# Patient Record
Sex: Male | Born: 1955 | Race: White | Hispanic: No | Marital: Married | Smoking: Never smoker
Health system: Southern US, Community
[De-identification: ages and names within clinical notes are randomized; demographics above are authoritative.]

## PROBLEM LIST (undated history)

## (undated) DIAGNOSIS — K259 Gastric ulcer, unspecified as acute or chronic, without hemorrhage or perforation: Secondary | ICD-10-CM

## (undated) DIAGNOSIS — J189 Pneumonia, unspecified organism: Secondary | ICD-10-CM

## (undated) DIAGNOSIS — M1990 Unspecified osteoarthritis, unspecified site: Secondary | ICD-10-CM

## (undated) DIAGNOSIS — C679 Malignant neoplasm of bladder, unspecified: Secondary | ICD-10-CM

## (undated) DIAGNOSIS — Z87442 Personal history of urinary calculi: Secondary | ICD-10-CM

## (undated) DIAGNOSIS — I499 Cardiac arrhythmia, unspecified: Secondary | ICD-10-CM

## (undated) HISTORY — DX: Malignant neoplasm of bladder, unspecified: C67.9

## (undated) HISTORY — PX: HERNIA REPAIR: SHX51

## (undated) HISTORY — PX: GASTRECTOMY: SHX58

## (undated) HISTORY — PX: KIDNEY STONE SURGERY: SHX686

## (undated) HISTORY — PX: APPENDECTOMY: SHX54

## (undated) HISTORY — PX: PERCUTANEOUS NEPHROLITHOTRIPSY: SHX2206

## (undated) HISTORY — PX: CHOLECYSTECTOMY: SHX55

---

## 2003-03-19 ENCOUNTER — Ambulatory Visit (HOSPITAL_COMMUNITY): Admission: RE | Admit: 2003-03-19 | Discharge: 2003-03-19 | Payer: Self-pay | Admitting: Family Medicine

## 2003-03-19 ENCOUNTER — Encounter: Payer: Self-pay | Admitting: Family Medicine

## 2009-02-16 ENCOUNTER — Emergency Department (HOSPITAL_COMMUNITY): Admission: EM | Admit: 2009-02-16 | Discharge: 2009-02-16 | Payer: Self-pay | Admitting: Emergency Medicine

## 2011-07-29 DIAGNOSIS — Z87442 Personal history of urinary calculi: Secondary | ICD-10-CM | POA: Insufficient documentation

## 2011-07-29 DIAGNOSIS — E291 Testicular hypofunction: Secondary | ICD-10-CM | POA: Insufficient documentation

## 2011-07-29 DIAGNOSIS — R6882 Decreased libido: Secondary | ICD-10-CM | POA: Insufficient documentation

## 2011-10-28 DIAGNOSIS — E538 Deficiency of other specified B group vitamins: Secondary | ICD-10-CM | POA: Insufficient documentation

## 2011-12-05 DIAGNOSIS — G4733 Obstructive sleep apnea (adult) (pediatric): Secondary | ICD-10-CM | POA: Insufficient documentation

## 2012-03-21 DIAGNOSIS — C679 Malignant neoplasm of bladder, unspecified: Secondary | ICD-10-CM | POA: Insufficient documentation

## 2012-07-24 ENCOUNTER — Ambulatory Visit: Payer: Self-pay | Admitting: Emergency Medicine

## 2012-07-24 LAB — CBC WITH DIFFERENTIAL/PLATELET
Eosinophil %: 4.6 %
HCT: 46.6 % (ref 40.0–52.0)
Lymphocyte #: 2.6 10*3/uL (ref 1.0–3.6)
MCHC: 32.8 g/dL (ref 32.0–36.0)
MCV: 87 fL (ref 80–100)
Monocyte %: 8.5 %
Neutrophil #: 6.2 10*3/uL (ref 1.4–6.5)
RDW: 15.6 % — ABNORMAL HIGH (ref 11.5–14.5)

## 2012-07-31 ENCOUNTER — Ambulatory Visit: Payer: Self-pay | Admitting: Emergency Medicine

## 2014-05-31 DIAGNOSIS — N200 Calculus of kidney: Secondary | ICD-10-CM | POA: Insufficient documentation

## 2014-12-30 NOTE — Op Note (Signed)
PATIENT NAME:  Alex Thomas, Alex Thomas MR#:  [id number] DATE OF BIRTH:  March 13, 1956  DATE OF PROCEDURE:  07/31/2012  PREOPERATIVE DIAGNOSIS: Large ventral hernia.   POSTOPERATIVE DIAGNOSIS: Large ventral hernia.   PROCEDURE: Repair of ventral hernia with mesh.  INDICATION: This is a patient who had multiple surgeries in the abdomen, has a long midline scar, and in the upper to middle part of the scar the patient has a large lump which is increasingly getting tender. The patient was then brought in for surgery.  DESCRIPTION OF PROCEDURE: Under general anesthesia, the abdomen was then prepped and draped. A small incision was made in the midline. After cutting skin and subcutaneous tissue, the hernia was noticed. The opening was small so dissected all around. I had to release a little bit to get the contents reduced back into the abdomen. After this was done, I freed out the edges of the omentum away from the hernia opening and then I put a mesh there and sutured to the abdominal wall with interrupted 0 Surgilon sutures. It went very well. After that subcuticular closure was performed with 3-0 Vicryl and 4-0 Vicryl, also staples, and pressure dressing was applied. The patient tolerated the procedure well and was sent to the Recovery Room in satisfactory condition.  ____________________________ Welford Roche Phylis Bougie, MD msh:slb D: 07/31/2012 10:33:22 ET T: 07/31/2012 11:02:43 ET JOB#: [id number]  cc: Meili Kleckley S. Phylis Bougie, MD, <Dictator> Meindert A. Brunetta Genera, MD Sharene Butters MD ELECTRONICALLY SIGNED 08/07/2012 11:06

## 2016-05-23 DIAGNOSIS — E291 Testicular hypofunction: Secondary | ICD-10-CM | POA: Diagnosis not present

## 2016-05-23 DIAGNOSIS — C679 Malignant neoplasm of bladder, unspecified: Secondary | ICD-10-CM | POA: Diagnosis not present

## 2016-05-23 DIAGNOSIS — D519 Vitamin B12 deficiency anemia, unspecified: Secondary | ICD-10-CM | POA: Diagnosis not present

## 2016-06-16 DIAGNOSIS — D519 Vitamin B12 deficiency anemia, unspecified: Secondary | ICD-10-CM | POA: Diagnosis not present

## 2016-06-16 DIAGNOSIS — E291 Testicular hypofunction: Secondary | ICD-10-CM | POA: Diagnosis not present

## 2016-06-16 DIAGNOSIS — E782 Mixed hyperlipidemia: Secondary | ICD-10-CM | POA: Diagnosis not present

## 2016-06-18 DIAGNOSIS — Z6841 Body Mass Index (BMI) 40.0 and over, adult: Secondary | ICD-10-CM | POA: Diagnosis not present

## 2016-07-23 DIAGNOSIS — E291 Testicular hypofunction: Secondary | ICD-10-CM | POA: Diagnosis not present

## 2016-07-23 DIAGNOSIS — E785 Hyperlipidemia, unspecified: Secondary | ICD-10-CM | POA: Diagnosis not present

## 2016-07-23 DIAGNOSIS — R7301 Impaired fasting glucose: Secondary | ICD-10-CM | POA: Diagnosis not present

## 2016-07-23 DIAGNOSIS — Z23 Encounter for immunization: Secondary | ICD-10-CM | POA: Diagnosis not present

## 2016-07-23 DIAGNOSIS — E870 Hyperosmolality and hypernatremia: Secondary | ICD-10-CM | POA: Diagnosis not present

## 2016-07-23 DIAGNOSIS — Z0001 Encounter for general adult medical examination with abnormal findings: Secondary | ICD-10-CM | POA: Diagnosis not present

## 2017-05-12 ENCOUNTER — Ambulatory Visit (HOSPITAL_COMMUNITY)
Admission: RE | Admit: 2017-05-12 | Discharge: 2017-05-12 | Disposition: A | Discharge status: Home / Self Care | Payer: BLUE CROSS/BLUE SHIELD | Source: Ambulatory Visit | Attending: Internal Medicine | Admitting: Internal Medicine

## 2017-05-12 ENCOUNTER — Other Ambulatory Visit (HOSPITAL_COMMUNITY): Payer: Self-pay | Admitting: Internal Medicine

## 2017-05-12 DIAGNOSIS — N433 Hydrocele, unspecified: Secondary | ICD-10-CM | POA: Diagnosis not present

## 2017-05-12 DIAGNOSIS — N50811 Right testicular pain: Secondary | ICD-10-CM | POA: Diagnosis not present

## 2017-05-12 DIAGNOSIS — N4400 Torsion of testis, unspecified: Secondary | ICD-10-CM

## 2017-05-12 DIAGNOSIS — R52 Pain, unspecified: Secondary | ICD-10-CM | POA: Diagnosis not present

## 2017-05-12 DIAGNOSIS — N451 Epididymitis: Secondary | ICD-10-CM | POA: Diagnosis not present

## 2017-05-12 DIAGNOSIS — N5089 Other specified disorders of the male genital organs: Secondary | ICD-10-CM | POA: Diagnosis not present

## 2019-01-15 DIAGNOSIS — N390 Urinary tract infection, site not specified: Secondary | ICD-10-CM | POA: Diagnosis not present

## 2019-01-15 DIAGNOSIS — M545 Low back pain: Secondary | ICD-10-CM | POA: Diagnosis not present

## 2019-03-13 DIAGNOSIS — R7301 Impaired fasting glucose: Secondary | ICD-10-CM | POA: Diagnosis not present

## 2019-03-13 DIAGNOSIS — Z1329 Encounter for screening for other suspected endocrine disorder: Secondary | ICD-10-CM | POA: Diagnosis not present

## 2019-03-13 DIAGNOSIS — Z0000 Encounter for general adult medical examination without abnormal findings: Secondary | ICD-10-CM | POA: Diagnosis not present

## 2021-07-15 ENCOUNTER — Encounter (INDEPENDENT_AMBULATORY_CARE_PROVIDER_SITE_OTHER): Payer: Self-pay | Admitting: *Deleted

## 2021-07-21 DIAGNOSIS — E785 Hyperlipidemia, unspecified: Secondary | ICD-10-CM | POA: Insufficient documentation

## 2021-10-22 ENCOUNTER — Telehealth (INDEPENDENT_AMBULATORY_CARE_PROVIDER_SITE_OTHER): Payer: Self-pay | Admitting: *Deleted

## 2021-10-22 NOTE — Telephone Encounter (Signed)
Called pt, spouse answer. Pt not available currently. LMTCB with spouse to schedule colonoscopy with Dr. Jenetta Downer, propofol asa 3.

## 2021-11-03 ENCOUNTER — Other Ambulatory Visit (INDEPENDENT_AMBULATORY_CARE_PROVIDER_SITE_OTHER): Payer: Self-pay

## 2021-11-03 ENCOUNTER — Telehealth (INDEPENDENT_AMBULATORY_CARE_PROVIDER_SITE_OTHER): Payer: Self-pay

## 2021-11-03 ENCOUNTER — Encounter (INDEPENDENT_AMBULATORY_CARE_PROVIDER_SITE_OTHER): Payer: Self-pay

## 2021-11-03 DIAGNOSIS — Z800 Family history of malignant neoplasm of digestive organs: Secondary | ICD-10-CM

## 2021-11-03 DIAGNOSIS — Z1211 Encounter for screening for malignant neoplasm of colon: Secondary | ICD-10-CM

## 2021-11-03 MED ORDER — PEG 3350-KCL-NA BICARB-NACL 420 G PO SOLR: 4000.0000 mL | ORAL | 0 refills | Status: DC

## 2021-11-03 NOTE — Telephone Encounter (Signed)
Alex Thomas CMA

## 2021-11-03 NOTE — Telephone Encounter (Signed)
Ok to schedule.  Thanks,  Ashleyann Shoun [name], MD Gastroenterology and Hepatology [facility]  

## 2021-11-03 NOTE — Telephone Encounter (Signed)
Referring MD/PCP: [place] Crane  Procedure: Tcs  Reason/Indication:  Screening, Fam Hx of Colon Ca  Has patient had this procedure before?  no  If so, when, by whom and where?    Is there a family history of colon cancer?  yes  Who?  What age when diagnosed?  mother  Is patient diabetic? If yes, Type 1 or Type 2   no      Does patient have prosthetic heart valve or mechanical valve?  no  Do you have a pacemaker/defibrillator?  no  Has patient ever had endocarditis/atrial fibrillation? no  Does patient use oxygen? no  Has patient had joint replacement within last 12 months?  no  Is patient constipated or do they take laxatives? no  Does patient have a history of alcohol/drug use?  no  Have you had a stroke/heart attack last 6 mths? no  Do you take medicine for weight loss?  no  For male patients,: have you had a hysterectomy N/A                      are you post menopausal N/A                      do you still have your menstrual cycle N/A  Is patient on blood thinner such as Coumadin, Plavix and/or Aspirin? no  Medications: none  Allergies: iodinated contrast media  Medication Adjustment per Dr Jenetta Downer none  Procedure date & time: 11/23/21 at 8:05 am

## 2021-11-04 ENCOUNTER — Encounter (INDEPENDENT_AMBULATORY_CARE_PROVIDER_SITE_OTHER): Payer: Self-pay

## 2021-11-16 NOTE — Patient Instructions (Signed)
? ? ? ? ? ? ? Alex Thomas ? 11/16/2021  ?  ? '@PREFPERIOPPHARMACY'$ @ ? ? Your procedure is scheduled on  11/23/2021. ? ? Report to [facility] at  [phone]  A.M. ? ? Call this number if you have problems the morning of surgery: ? [phone] ? ? Remember: ? Follow the diet and prep instructions given to you by the office. ?  ? Take these medicines the morning of surgery with A SIP OF WATER  ? ?                                        None ?  ? ? Do not wear jewelry, make-up or nail polish. ? Do not wear lotions, powders, or perfumes, or deodorant. ? Do not shave 48 hours prior to surgery.  Men may shave face and neck. ? Do not bring valuables to the hospital. ? [name] is not responsible for any belongings or valuables. ? ?Contacts, dentures or bridgework may not be worn into surgery.  Leave your suitcase in the car.  After surgery it may be brought to your room. ? ?For patients admitted to the hospital, discharge time will be determined by your treatment team. ? ?Patients discharged the day of surgery will not be allowed to drive home and must have someone with them for 24 hours.  ? ? ?Special instructions:   DO NOT smoke tobacco or vape for 24 hours before your procedure. ? ?Please read over the following fact sheets that you were given. ?Anesthesia Post-op Instructions and Care and Recovery After Surgery ?  ? ? ? Colonoscopy, Adult, Care After ?This sheet gives you information about how to care for yourself after your procedure. Your health care provider may also give you more specific instructions. If you have problems or questions, contact your health care provider. ?What can I expect after the procedure? ?After the procedure, it is common to have: ?A small amount of blood in your stool for 24 hours after the procedure. ?Some gas. ?Mild cramping or bloating of your abdomen. ?Follow these instructions at home: ?Eating and drinking ? ?Drink enough fluid to keep your urine pale yellow. ?Follow instructions from your  health care provider about eating or drinking restrictions. ?Resume your normal diet as instructed by your health care provider. Avoid heavy or fried foods that are hard to digest. ?Activity ?Rest as told by your health care provider. ?Avoid sitting for a long time without moving. Get up to take short walks every 1-2 hours. This is important to improve blood flow and breathing. Ask for help if you feel weak or unsteady. ?Return to your normal activities as told by your health care provider. Ask your health care provider what activities are safe for you. ?Managing cramping and bloating ? ?Try walking around when you have cramps or feel bloated. ?Apply heat to your abdomen as told by your health care provider. Use the heat source that your health care provider recommends, such as a moist heat pack or a heating pad. ?Place a towel between your skin and the heat source. ?Leave the heat on for 20-30 minutes. ?Remove the heat if your skin turns bright red. This is especially important if you are unable to feel pain, heat, or cold. You may have a greater risk of getting burned. ?General instructions ?If you were given a sedative during the procedure, it  can affect you for several hours. Do not drive or operate machinery until your health care provider says that it is safe. ?For the first 24 hours after the procedure: ?Do not sign important documents. ?Do not drink alcohol. ?Do your regular daily activities at a slower pace than normal. ?Eat soft foods that are easy to digest. ?Take over-the-counter and prescription medicines only as told by your health care provider. ?Keep all follow-up visits as told by your health care provider. This is important. ?Contact a health care provider if: ?You have blood in your stool 2-3 days after the procedure. ?Get help right away if you have: ?More than a small spotting of blood in your stool. ?Large blood clots in your stool. ?Swelling of your abdomen. ?Nausea or vomiting. ?A  fever. ?Increasing pain in your abdomen that is not relieved with medicine. ?Summary ?After the procedure, it is common to have a small amount of blood in your stool. You may also have mild cramping and bloating of your abdomen. ?If you were given a sedative during the procedure, it can affect you for several hours. Do not drive or operate machinery until your health care provider says that it is safe. ?Get help right away if you have a lot of blood in your stool, nausea or vomiting, a fever, or increased pain in your abdomen. ?This information is not intended to replace advice given to you by your health care provider. Make sure you discuss any questions you have with your health care provider. ?Document Revised: 07/05/2019 Document Reviewed: 03/25/2019 ?Elsevier Patient Education ? [place]. ?Monitored Anesthesia Care, Care After ?This sheet gives you information about how to care for yourself after your procedure. Your health care provider may also give you more specific instructions. If you have problems or questions, contact your health care provider. ?What can I expect after the procedure? ?After the procedure, it is common to have: ?Tiredness. ?Forgetfulness about what happened after the procedure. ?Impaired judgment for important decisions. ?Nausea or vomiting. ?Some difficulty with balance. ?Follow these instructions at home: ?For the time period you were told by your health care provider: ?  ?Rest as needed. ?Do not participate in activities where you could fall or become injured. ?Do not drive or use machinery. ?Do not drink alcohol. ?Do not take sleeping pills or medicines that cause drowsiness. ?Do not make important decisions or sign legal documents. ?Do not take care of children on your own. ?Eating and drinking ?Follow the diet that is recommended by your health care provider. ?Drink enough fluid to keep your urine pale yellow. ?If you vomit: ?Drink water, juice, or soup when you can drink  without vomiting. ?Make sure you have little or no nausea before eating solid foods. ?General instructions ?Have a responsible adult stay with you for the time you are told. It is important to have someone help care for you until you are awake and alert. ?Take over-the-counter and prescription medicines only as told by your health care provider. ?If you have sleep apnea, surgery and certain medicines can increase your risk for breathing problems. Follow instructions from your health care provider about wearing your sleep device: ?Anytime you are sleeping, including during daytime naps. ?While taking prescription pain medicines, sleeping medicines, or medicines that make you drowsy. ?Avoid smoking. ?Keep all follow-up visits as told by your health care provider. This is important. ?Contact a health care provider if: ?You keep feeling nauseous or you keep vomiting. ?You feel light-headed. ?You are still  sleepy or having trouble with balance after 24 hours. ?You develop a rash. ?You have a fever. ?You have redness or swelling around the IV site. ?Get help right away if: ?You have trouble breathing. ?You have new-onset confusion at home. ?Summary ?For several hours after your procedure, you may feel tired. You may also be forgetful and have poor judgment. ?Have a responsible adult stay with you for the time you are told. It is important to have someone help care for you until you are awake and alert. ?Rest as told. Do not drive or operate machinery. Do not drink alcohol or take sleeping pills. ?Get help right away if you have trouble breathing, or if you suddenly become confused. ?This information is not intended to replace advice given to you by your health care provider. Make sure you discuss any questions you have with your health care provider. ?Document Revised: 05/14/2020 Document Reviewed: 08/01/2019 ?Elsevier Patient Education ? [place]. ? ?

## 2021-11-18 ENCOUNTER — Other Ambulatory Visit (INDEPENDENT_AMBULATORY_CARE_PROVIDER_SITE_OTHER): Payer: Self-pay

## 2021-11-19 ENCOUNTER — Encounter (HOSPITAL_COMMUNITY): Payer: Self-pay

## 2021-11-19 ENCOUNTER — Encounter (HOSPITAL_COMMUNITY)
Admission: RE | Admit: 2021-11-19 | Discharge: 2021-11-19 | Disposition: A | Discharge status: Home / Self Care | Payer: Medicare Other | Source: Ambulatory Visit | Attending: Gastroenterology | Admitting: Gastroenterology

## 2021-11-19 HISTORY — DX: Gastric ulcer, unspecified as acute or chronic, without hemorrhage or perforation: K25.9

## 2021-11-19 HISTORY — DX: Personal history of urinary calculi: Z87.442

## 2021-11-23 ENCOUNTER — Encounter (HOSPITAL_COMMUNITY): Payer: Self-pay | Admitting: Gastroenterology

## 2021-11-23 ENCOUNTER — Ambulatory Visit (HOSPITAL_COMMUNITY): Payer: Medicare Other | Admitting: Certified Registered"

## 2021-11-23 ENCOUNTER — Ambulatory Visit (HOSPITAL_COMMUNITY)
Admission: RE | Admit: 2021-11-23 | Discharge: 2021-11-23 | Disposition: A | Discharge status: Home / Self Care | Payer: Medicare Other | Attending: Gastroenterology | Admitting: Gastroenterology

## 2021-11-23 ENCOUNTER — Ambulatory Visit (HOSPITAL_BASED_OUTPATIENT_CLINIC_OR_DEPARTMENT_OTHER): Payer: Medicare Other | Admitting: Certified Registered"

## 2021-11-23 ENCOUNTER — Encounter (HOSPITAL_COMMUNITY)
Admission: RE | Disposition: A | Discharge status: Home / Self Care | Payer: Self-pay | Source: Home / Self Care | Attending: Gastroenterology

## 2021-11-23 ENCOUNTER — Encounter (INDEPENDENT_AMBULATORY_CARE_PROVIDER_SITE_OTHER): Payer: Self-pay | Admitting: *Deleted

## 2021-11-23 DIAGNOSIS — Z1211 Encounter for screening for malignant neoplasm of colon: Secondary | ICD-10-CM | POA: Diagnosis not present

## 2021-11-23 DIAGNOSIS — K635 Polyp of colon: Secondary | ICD-10-CM

## 2021-11-23 DIAGNOSIS — Z8711 Personal history of peptic ulcer disease: Secondary | ICD-10-CM | POA: Insufficient documentation

## 2021-11-23 DIAGNOSIS — K529 Noninfective gastroenteritis and colitis, unspecified: Secondary | ICD-10-CM

## 2021-11-23 DIAGNOSIS — Z6841 Body Mass Index (BMI) 40.0 and over, adult: Secondary | ICD-10-CM | POA: Insufficient documentation

## 2021-11-23 DIAGNOSIS — K5730 Diverticulosis of large intestine without perforation or abscess without bleeding: Secondary | ICD-10-CM | POA: Diagnosis not present

## 2021-11-23 DIAGNOSIS — K6289 Other specified diseases of anus and rectum: Secondary | ICD-10-CM | POA: Diagnosis not present

## 2021-11-23 DIAGNOSIS — K5140 Inflammatory polyps of colon without complications: Secondary | ICD-10-CM | POA: Insufficient documentation

## 2021-11-23 DIAGNOSIS — Z800 Family history of malignant neoplasm of digestive organs: Secondary | ICD-10-CM | POA: Diagnosis not present

## 2021-11-23 DIAGNOSIS — K648 Other hemorrhoids: Secondary | ICD-10-CM

## 2021-11-23 DIAGNOSIS — Z139 Encounter for screening, unspecified: Secondary | ICD-10-CM | POA: Diagnosis not present

## 2021-11-23 HISTORY — PX: POLYPECTOMY: SHX5525

## 2021-11-23 HISTORY — PX: BIOPSY: SHX5522

## 2021-11-23 HISTORY — PX: COLONOSCOPY WITH PROPOFOL: SHX5780

## 2021-11-23 LAB — COMPREHENSIVE METABOLIC PANEL
ALT: 28 U/L (ref 0–44)
AST: 27 U/L (ref 15–41)
Albumin: 4 g/dL (ref 3.5–5.0)
Alkaline Phosphatase: 370 U/L — ABNORMAL HIGH (ref 38–126)
Anion gap: 11 (ref 5–15)
BUN: 16 mg/dL (ref 8–23)
CO2: 24 mmol/L (ref 22–32)
Calcium: 8.9 mg/dL (ref 8.9–10.3)
Chloride: 102 mmol/L (ref 98–111)
Creatinine, Ser: 1.34 mg/dL — ABNORMAL HIGH (ref 0.61–1.24)
GFR, Estimated: 59 mL/min — ABNORMAL LOW (ref >60)
Glucose, Bld: 92 mg/dL (ref 70–99)
Potassium: 4.1 mmol/L (ref 3.5–5.1)
Sodium: 137 mmol/L (ref 135–145)
Total Bilirubin: 0.8 mg/dL (ref 0.3–1.2)
Total Protein: 8.2 g/dL — ABNORMAL HIGH (ref 6.5–8.1)

## 2021-11-23 LAB — CBC
HCT: 49.1 % (ref 39.0–52.0)
Hemoglobin: 15.4 g/dL (ref 13.0–17.0)
MCH: 29.5 pg (ref 26.0–34.0)
MCHC: 31.4 g/dL (ref 30.0–36.0)
MCV: 94.1 fL (ref 80.0–100.0)
Platelets: 342 10*3/uL (ref 150–400)
RBC: 5.22 MIL/uL (ref 4.22–5.81)
RDW: 14.4 % (ref 11.5–15.5)
WBC: 9.7 10*3/uL (ref 4.0–10.5)
nRBC: 0 % (ref 0.0–0.2)

## 2021-11-23 LAB — HEPATITIS B SURFACE ANTIGEN: Hepatitis B Surface Ag: NONREACTIVE

## 2021-11-23 LAB — HM COLONOSCOPY

## 2021-11-23 LAB — C-REACTIVE PROTEIN: CRP: 2.4 mg/dL — ABNORMAL HIGH (ref <1.0)

## 2021-11-23 SURGERY — COLONOSCOPY WITH PROPOFOL
Anesthesia: General

## 2021-11-23 MED ORDER — PHENYLEPHRINE HCL (PRESSORS) 10 MG/ML IV SOLN
INTRAVENOUS | Status: DC | PRN
Start: 2021-11-23 — End: 2021-11-23
  Administered 2021-11-23 (×2): 100 ug via INTRAVENOUS
  Administered 2021-11-23: 120 ug via INTRAVENOUS
  Administered 2021-11-23 (×2): 100 ug via INTRAVENOUS
  Administered 2021-11-23: 120 ug via INTRAVENOUS

## 2021-11-23 MED ORDER — LACTATED RINGERS IV SOLN: INTRAVENOUS | Status: DC

## 2021-11-23 MED ORDER — PROPOFOL 500 MG/50ML IV EMUL
INTRAVENOUS | Status: DC | PRN
  Administered 2021-11-23: 100 ug/kg/min via INTRAVENOUS

## 2021-11-23 MED ORDER — PROPOFOL 10 MG/ML IV BOLUS
INTRAVENOUS | Status: DC | PRN
  Administered 2021-11-23: 100 mg via INTRAVENOUS

## 2021-11-23 MED ORDER — PHENYLEPHRINE 40 MCG/ML (10ML) SYRINGE FOR IV PUSH (FOR BLOOD PRESSURE SUPPORT)
PREFILLED_SYRINGE | INTRAVENOUS | Status: AC
  Filled 2021-11-23: qty 10

## 2021-11-23 NOTE — Discharge Instructions (Signed)
You are being discharged to home.  Resume your previous diet.  We are waiting for your pathology results.  Your physician has recommended a repeat colonoscopy for surveillance based on pathology results.  

## 2021-11-23 NOTE — Op Note (Signed)
[facility] ?Patient Name: Alex Thomas ?Procedure Date: 11/23/2021 7:47 AM ?MRN: [id number] ?Date of Birth: Jun 26, 1956 ?Attending MD: [name] ,  ?CSN: [id number] ?Age: 66 ?Admit Type: Outpatient ?Procedure:                Colonoscopy ?Indications:              Screening for colorectal malignant neoplasm ?Providers:                [name], Lambert Mody, Aram Candela ?Referring MD:              ?Medicines:                Monitored Anesthesia Care ?Complications:            No immediate complications. ?Estimated Blood Loss:     Estimated blood loss: none. ?Procedure:                Pre-Anesthesia Assessment: ?                          - Prior to the procedure, a History and Physical  ?                          was performed, and patient medications, allergies  ?                          and sensitivities were reviewed. The patient's  ?                          tolerance of previous anesthesia was reviewed. ?                          - The risks and benefits of the procedure and the  ?                          sedation options and risks were discussed with the  ?                          patient. All questions were answered and informed  ?                          consent was obtained. ?                          - ASA Grade Assessment: II - A patient with mild  ?                          systemic disease. ?                          After obtaining informed consent, the colonoscope  ?                          was passed under direct vision. Throughout the  ?                          procedure, the patient's blood pressure, pulse,  and  ?                          oxygen saturations were monitored continuously. The  ?                          PCF-HQ190L ([id number]) scope was introduced through  ?                          the anus and advanced to the the terminal ileum.  ?                          The colonoscopy was performed without difficulty.  ?                          The patient tolerated the  procedure well. The  ?                          quality of the bowel preparation was good. ?Scope In: 8:03:51 AM ?Scope Out: 8:30:57 AM ?Scope Withdrawal Time: 0 hours 13 minutes 35 seconds  ?Total Procedure Duration: 0 hours 27 minutes 6 seconds  ?Findings: ?     The perianal and digital rectal examinations were normal. ?     The terminal ileum appeared normal. Biopsies were taken with a cold  ?     forceps for histology. ?     A 4 mm polyp was found in the cecum. The polyp was semi-sessile. The  ?     polyp was removed with a cold snare. Resection and retrieval were  ?     complete. ?     Inflammation characterized by altered vascularity, congestion (edema),  ?     erythema and granularity was found in a continuous and circumferential  ?     pattern from the sigmoid colon to the cecum. The area 40 cm proximal to  ?     anus was spared. This was moderate in severity. Biopsies from cecum/AC,  ?     transverse, DC, sigmoid and rectum were taken with a cold forceps for  ?     histology. ?     A few large-mouthed diverticula were found in the sigmoid colon. ?     Non-bleeding internal hemorrhoids were found during retroflexion. The  ?     hemorrhoids were small. ?Impression:               - The examined portion of the ileum was normal.  ?                          Biopsied. ?                          - One 4 mm polyp in the cecum, removed with a cold  ?                          snare. Resected and retrieved. ?                          - Colitis. Inflammation was found from the sigmoid  ?  colon to the cecum. This was moderate in severity.  ?                          Biopsied. ?                          - Diverticulosis in the sigmoid colon. ?                          - Non-bleeding internal hemorrhoids. ?Moderate Sedation: ?     Per Anesthesia Care ?Recommendation:           - Discharge patient to home (ambulatory). ?                          - Resume previous diet. ?                          -  Await pathology results. ?                          - Repeat colonoscopy for surveillance based on  ?                          pathology results. ?Procedure Code(s):        --- Professional --- ?                          [phone], Colonoscopy, flexible; with removal of  ?                          tumor(s), polyp(s), or other lesion(s) by snare  ?                          technique ?                          45380, 59, Colonoscopy, flexible; with biopsy,  ?                          single or multiple ?Diagnosis Code(s):        --- Professional --- ?                          Z12.11, Encounter for screening for malignant  ?                          neoplasm of colon ?                          K64.8, Other hemorrhoids ?                          K63.5, Polyp of colon ?                          K52.9, Noninfective gastroenteritis and colitis,  ?  unspecified ?                          K57.30, Diverticulosis of large intestine without  ?                          perforation or abscess without bleeding ?CPT copyright 2019 American Medical Association. All rights reserved. ?The codes documented in this report are preliminary and upon coder review may  ?be revised to meet current compliance requirements. ?[name], MD ?[name],  ?11/23/2021 8:41:39 AM ?This report has been signed electronically. ?Number of Addenda: 0 ?

## 2021-11-23 NOTE — Anesthesia Preprocedure Evaluation (Signed)
Anesthesia Evaluation  ?Patient identified by MRN, date of birth, ID band ?Patient awake ? ? ? ?Reviewed: ?Allergy & Precautions, H&P , NPO status , Patient's Chart, lab work & pertinent test results, reviewed documented beta blocker date and time  ? ?Airway ?Mallampati: II ? ?TM Distance: >3 FB ?Neck ROM: full ? ? ? Dental ?no notable dental hx. ? ?  ?Pulmonary ?neg pulmonary ROS,  ?  ?Pulmonary exam normal ?breath sounds clear to auscultation ? ? ? ? ? ? Cardiovascular ?Exercise Tolerance: Good ?negative cardio ROS ? ? ?Rhythm:regular Rate:Normal ? ? ?  ?Neuro/Psych ?negative neurological ROS ? negative psych ROS  ? GI/Hepatic ?Neg liver ROS, PUD,   ?Endo/Other  ?Morbid obesity ? Renal/GU ?negative Renal ROS  ?negative genitourinary ?  ?Musculoskeletal ? ? Abdominal ?  ?Peds ? Hematology ?negative hematology ROS ?(+)   ?Anesthesia Other Findings ? ? Reproductive/Obstetrics ?negative OB ROS ? ?  ? ? ? ? ? ? ? ? ? ? ? ? ? ?  ?  ? ? ? ? ? ? ? ? ?Anesthesia Physical ?Anesthesia Plan ? ?ASA: 3 ? ?Anesthesia Plan: General  ? ?Post-op Pain Management:   ? ?Induction:  ? ?PONV Risk Score and Plan: Propofol infusion ? ?Airway Management Planned:  ? ?Additional Equipment:  ? ?Intra-op Plan:  ? ?Post-operative Plan:  ? ?Informed Consent: I have reviewed the patients History and Physical, chart, labs and discussed the procedure including the risks, benefits and alternatives for the proposed anesthesia with the patient or authorized representative who has indicated his/her understanding and acceptance.  ? ? ? ?Dental Advisory Given ? ?Plan Discussed with: CRNA ? ?Anesthesia Plan Comments:   ? ? ? ? ? ? ?Anesthesia Quick Evaluation ? ?

## 2021-11-23 NOTE — H&P (Signed)
Alex Thomas is an 66 y.o. male.   ?Chief Complaint: CRC screening ?HPI: 66 y/o M with PMH gastric ulcers, coming for screening colonoscopy. Last colonoscopy perfromed close to 15 years ago per patient, states that it was normal but no reports are available.  The patient denies having any complaints such as melena, hematochezia, abdominal pain or distention, change in her bowel movement consistency or frequency, no changes in her weight recently.  Mother diagnosed with colon cancer in her late 66s. ? ?Past Medical History:  ?Diagnosis Date  ? History of kidney stones   ? Multiple gastric ulcers   ? ? ?Past Surgical History:  ?Procedure Laterality Date  ? APPENDECTOMY    ? GASTRECTOMY    ? HERNIA REPAIR    ? KIDNEY STONE SURGERY    ? ? ?History reviewed. No pertinent family history. ?Social History:  reports that he has never smoked. He has never used smokeless tobacco. He reports that he does not currently use alcohol. He reports that he does not currently use drugs. ? ?Allergies: No Known Allergies ? ?Medications Prior to Admission  ?Medication Sig Dispense Refill  ? polyethylene glycol-electrolytes (TRILYTE) 420 g solution Take 4,000 mLs by mouth as directed. 4000 mL 0  ? ? ?No results found for this or any previous visit (from the past 48 hour(s)). ?No results found. ? ?Review of Systems  ?Constitutional: Negative.   ?HENT: Negative.    ?Eyes: Negative.   ?Respiratory: Negative.    ?Cardiovascular: Negative.   ?Gastrointestinal: Negative.   ?Endocrine: Negative.   ?Genitourinary: Negative.   ?Musculoskeletal: Negative.   ?Skin: Negative.   ?Allergic/Immunologic: Negative.   ?Neurological: Negative.   ?Hematological: Negative.   ?Psychiatric/Behavioral: Negative.    ? ?Blood pressure 99/63, pulse 98, temperature 97.8 ?F (36.6 ?C), temperature source Oral, resp. rate 18, height '6\' 3"'$  (1.905 m), weight (!) 145.4 kg, SpO2 96 %. ?Physical Exam  ?GENERAL: The patient is AO x3, in no acute distress. ?HEENT: Head is  normocephalic and atraumatic. EOMI are intact. Mouth is well hydrated and without lesions. ?NECK: Supple. No masses ?LUNGS: Clear to auscultation. No presence of rhonchi/wheezing/rales. Adequate chest expansion ?HEART: RRR, normal s1 and s2. ?ABDOMEN: Soft, nontender, no guarding, no peritoneal signs, and nondistended. BS +. No masses. ?EXTREMITIES: Without any cyanosis, clubbing, rash, lesions or edema. ?NEUROLOGIC: AOx3, no focal motor deficit. ?SKIN: no jaundice, no rashes ? ?Assessment/Plan ? 66 y/o M with PMH gastric ulcers, coming for screening colonoscopy. The patient is at average risk for colorectal cancer.  We will proceed with colonoscopy today. ? ? ?[name], MD ?11/23/2021, 7:37 AM ? ? ? ?

## 2021-11-23 NOTE — Transfer of Care (Signed)
Immediate Anesthesia Transfer of Care Note ? ?Patient: Alex Thomas ? ?Procedure(s) Performed: COLONOSCOPY WITH PROPOFOL ?POLYPECTOMY ?BIOPSY ? ?Patient Location: PACU ? ?Anesthesia Type:General ? ?Level of Consciousness: awake, alert , oriented and patient cooperative ? ?Airway & Oxygen Therapy: Patient Spontanous Breathing ? ?Post-op Assessment: Report given to RN, Post -op Vital signs reviewed and stable and Patient moving all extremities X 4 ? ?Post vital signs: Reviewed and stable ? ?Last Vitals:  ?Vitals Value Taken Time  ?BP    ?Temp 36.6 ?C 11/23/21 0833  ?Pulse 87 11/23/21 0833  ?Resp 19 11/23/21 0833  ?SpO2 96 % 11/23/21 0833  ? ? ?Last Pain:  ?Vitals:  ? 11/23/21 0833  ?TempSrc: Oral  ?PainSc: 0-No pain  ?   ? ?Patients Stated Pain Goal: 6 (11/23/21 2224) ? ?Complications: No notable events documented. ?

## 2021-11-23 NOTE — Anesthesia Postprocedure Evaluation (Signed)
Anesthesia Post Note ? ?Patient: Alex Thomas ? ?Procedure(s) Performed: COLONOSCOPY WITH PROPOFOL ?POLYPECTOMY ?BIOPSY ? ?Patient location during evaluation: Short Stay ?Anesthesia Type: General ?Level of consciousness: awake and alert ?Pain management: pain level controlled ?Vital Signs Assessment: post-procedure vital signs reviewed and stable ?Respiratory status: spontaneous breathing ?Cardiovascular status: blood pressure returned to baseline ?Postop Assessment: no apparent nausea or vomiting ?Anesthetic complications: no ? ? ?No notable events documented. ? ? ?Last Vitals:  ?Vitals:  ? 11/23/21 0724 11/23/21 0833  ?BP: 99/63   ?Pulse:  87  ?Resp:  19  ?Temp:  36.6 ?C  ?SpO2:  96%  ?  ?Last Pain:  ?Vitals:  ? 11/23/21 0833  ?TempSrc: Oral  ?PainSc: 0-No pain  ? ? ?  ?  ?  ?  ?  ?  ? ?Kordell Jafri ? ? ? ? ?

## 2021-11-24 LAB — SURGICAL PATHOLOGY

## 2021-11-25 LAB — QUANTIFERON-TB GOLD PLUS (RQFGPL)
QuantiFERON Mitogen Value: >10 IU/mL
QuantiFERON Nil Value: 0.01 IU/mL
QuantiFERON TB1 Ag Value: 0.03 IU/mL
QuantiFERON TB2 Ag Value: 0.02 IU/mL

## 2021-11-25 LAB — QUANTIFERON-TB GOLD PLUS: QuantiFERON-TB Gold Plus: NEGATIVE

## 2021-11-26 ENCOUNTER — Encounter (HOSPITAL_COMMUNITY): Payer: Self-pay | Admitting: Gastroenterology

## 2021-11-29 ENCOUNTER — Other Ambulatory Visit (INDEPENDENT_AMBULATORY_CARE_PROVIDER_SITE_OTHER): Payer: Self-pay

## 2021-11-29 ENCOUNTER — Encounter (INDEPENDENT_AMBULATORY_CARE_PROVIDER_SITE_OTHER): Payer: Self-pay

## 2021-11-29 DIAGNOSIS — R748 Abnormal levels of other serum enzymes: Secondary | ICD-10-CM

## 2021-12-15 ENCOUNTER — Other Ambulatory Visit (INDEPENDENT_AMBULATORY_CARE_PROVIDER_SITE_OTHER): Payer: Self-pay | Admitting: Gastroenterology

## 2021-12-15 ENCOUNTER — Ambulatory Visit (HOSPITAL_COMMUNITY)
Admission: RE | Admit: 2021-12-15 | Discharge: 2021-12-15 | Disposition: A | Discharge status: Home / Self Care | Payer: Medicare Other | Source: Ambulatory Visit | Attending: Gastroenterology | Admitting: Gastroenterology

## 2021-12-15 DIAGNOSIS — R748 Abnormal levels of other serum enzymes: Secondary | ICD-10-CM | POA: Diagnosis not present

## 2021-12-15 DIAGNOSIS — K7689 Other specified diseases of liver: Secondary | ICD-10-CM | POA: Diagnosis not present

## 2021-12-15 DIAGNOSIS — K7290 Hepatic failure, unspecified without coma: Secondary | ICD-10-CM | POA: Diagnosis not present

## 2021-12-15 DIAGNOSIS — K6389 Other specified diseases of intestine: Secondary | ICD-10-CM | POA: Diagnosis not present

## 2021-12-15 MED ORDER — GADOBUTROL 1 MMOL/ML IV SOLN
10.0000 mL | Freq: Once | INTRAVENOUS | Status: AC | PRN
  Administered 2021-12-15: 10 mL via INTRAVENOUS

## 2021-12-20 ENCOUNTER — Ambulatory Visit (INDEPENDENT_AMBULATORY_CARE_PROVIDER_SITE_OTHER): Payer: Medicare Other | Admitting: Gastroenterology

## 2021-12-20 ENCOUNTER — Encounter (INDEPENDENT_AMBULATORY_CARE_PROVIDER_SITE_OTHER): Payer: Self-pay | Admitting: Gastroenterology

## 2021-12-20 DIAGNOSIS — K529 Noninfective gastroenteritis and colitis, unspecified: Secondary | ICD-10-CM | POA: Insufficient documentation

## 2021-12-20 DIAGNOSIS — K8301 Primary sclerosing cholangitis: Secondary | ICD-10-CM | POA: Diagnosis not present

## 2021-12-20 DIAGNOSIS — K51019 Ulcerative (chronic) pancolitis with unspecified complications: Secondary | ICD-10-CM

## 2021-12-20 NOTE — Patient Instructions (Signed)
Start Entyvio treatment ?Schedule CT enterography ?Referral to [facility] hepatology for [facility] multidisciplinary evaluation ?Rtc 3 months ?

## 2021-12-20 NOTE — Progress Notes (Signed)
[name], M.D. ?Gastroenterology & Hepatology ?[facility] ?[address] ?[place], [place] [address] ? ?Primary Care Physician: ?Celene Squibb, MD ?[place] ?[place] [id number] ? ?I will communicate my assessment and recommendations to the referring MD via EMR. ? 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 ?History of Present Illness: ?Alex Thomas is a 66 y.o. 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 ? ?The patient was last seen on 11/03/2021 when he underwent a colonoscopy for screening purposes. At that time, the patient was found to have: ?The perianal and digital rectal examinations were normal. ?The terminal ileum appeared normal. Biopsies were taken with a cold forceps for histology. ?A 4 mm polyp was found in the cecum. The polyp was semi-sessile. The polyp was removed with a cold snare. Resection and retrieval were complete. Path - inflammatory polyp. ?Inflammation characterized by altered vascularity, congestion (edema), erythema and granularity was found in a continuous and circumferential pattern from the sigmoid colon to the cecum - this was concerning for IBD. The area 40 cm proximal to anus was spared. This was moderate in severity. Biopsies from cecum/AC, transverse, DC, sigmoid and rectum were taken with a cold forceps for histology. ?A few large-mouthed diverticula were found in the sigmoid colon. ?Non-bleeding internal hemorrhoids were found during retroflexion. The hemorrhoids were small. ? ?Path: ?A. COLON, CECAL, POLYPECTOMY:  ?-  Inflammatory polyp (1 of 1 fragments)  ?-  No high-grade dysplasia or  malignancy identified  ? ?B. SMALL BOWEL, BIOPSY:  ?-  Chronic active ileitis  ?-  No granulomas, dysplasia or malignancy identified  ? ?C. COLON, CECAL, ASCENDING, BIOPSY:  ?-  Chronic active colitis  ?-  No granulomata, dysplasia or malignancy identified  ? ?D. COLON, TRANSVERSE, BIOPSY:  ?-  Chronic active colitis  ?-  No granulomata, dysplasia or malignancy identified  ? ?E. COLON, DESCENDING, BIOPSY:  ?-  Chronic active colitis  ?-  No granulomata, dysplasia or malignancy identified  ? ?F. COLON, SIGMOID, BIOPSY:  ?-  Chronic colitis  ?-  No acute inflammation  ?-  No granulomata, dysplasia or malignancy identified  ? ?G. RECTUM, BIOPSY:  ?-  Chronic colitis, focally active  ?-  No granulomata, dysplasia or malignancy identified  ? ?Due to these findings, I set up follow-up appointment in our clinic.  I also ordered blood work-up which showed markedly elevated alkaline phosphatase up to 378 his CMP with normal total bilirubin of 0.8, AST of 27, ALT of 28, albumin of 4.0, creatinine was 1.34, CRP was elevated 2.4 and CBC was within normal limits.  Also had hepatitis B surface antigen and QuantiFERON checked which were within normal limits.  Given the elevation of his alkaline phosphatase, I ordered an MRCP which showed the following findings ? ?Marked atrophy within the left hepatic lobe with associated intrahepatic biliary dilatation. Diffuse wall thickening and enhancement of the extrahepatic biliary system. Findings could be secondary to chronic cholangitis or segmental sclerosing cholangitis. No focal mass lesion or delayed enhancement identified to strongly suggest cholangiocarcinoma. ?The visualized colon demonstrates wall thickening and enhancement suspicious for colitis. ?Postsurgical changes suggesting previous distal gastrectomy. ?Probable reactive lymph nodes in  the porta hepatis. ? ?Regarding his symptoms, patient states for the last year he has presented episodes of changes in his bowel movements,  as he was having bowel movements a little later after having a meal. He also has looser bowel movements, usually moves his bowels twice a day on average. Denies any other complaints. The patient denies having any nausea, vomiting, fever, chills, hematochezia, melena, hematemesis, abdominal distention, abdominal pain, diarrhea, jaundice, pruritus. In the last few months he has lost 40 lb as he changed some of his habits. ? ?Last Colonoscopy: as above ? ?Mother had colon cancer at 50 years. No history of IBD in family. ? ?Past Medical History: ?Past Medical History:  ?Diagnosis Date  ? History of kidney stones   ? Multiple gastric ulcers   ? ? ?Past Surgical History: ?Past Surgical History:  ?Procedure Laterality Date  ? APPENDECTOMY    ? BIOPSY  11/23/2021  ? Procedure: BIOPSY;  Surgeon: Montez Morita, Quillian Quince, MD;  [facility];  Service: Gastroenterology;;  ? COLONOSCOPY WITH PROPOFOL N/A 11/23/2021  ? Procedure: COLONOSCOPY WITH PROPOFOL;  Surgeon: [name], MD;  [facility];  Service: Gastroenterology;  Laterality: N/A;  805 ASA 1  ? GASTRECTOMY    ? HERNIA REPAIR    ? KIDNEY STONE SURGERY    ? POLYPECTOMY  11/23/2021  ? Procedure: POLYPECTOMY;  Surgeon: [name], MD;  [facility];  Service: Gastroenterology;;  ? ? ?Family History:History reviewed. No pertinent family history. ? ?Social History: ?Social History  ? ?Tobacco Use  ?Smoking Status Never  ?Smokeless Tobacco Never  ? ?Social History  ? ?Substance and Sexual Activity  ?Alcohol Use Not Currently  ? ?Social History  ? ?Substance and Sexual Activity  ?Drug Use Not Currently  ? ? ?Allergies: ?No Known Allergies ? ?Medications: ?No current outpatient medications on file.  ? ?No current facility-administered medications for this visit.  ? ? ?Review of Systems: ?GENERAL: negative for malaise, night sweats ?HEENT: No changes in hearing or vision, no nose bleeds or other nasal problems. ?NECK:  Negative for lumps, goiter, pain and significant neck swelling ?RESPIRATORY: Negative for cough, wheezing ?CARDIOVASCULAR: Negative for chest pain, leg swelling, palpitations, orthopnea ?GI: SEE HPI ?MUSCULOSKELETAL: Negative for joint pain or swelling, back pain, and muscle pain. ?SKIN: Negative for lesions, rash ?PSYCH: Negative for sleep disturbance, mood disorder and recent psychosocial stressors. ?HEMATOLOGY Negative for prolonged bleeding, bruising easily, and swollen nodes. ?ENDOCRINE: Negative for cold or heat intolerance, polyuria, polydipsia and goiter. ?NEURO: negative for tremor, gait imbalance, syncope and seizures. ?The remainder of the review of systems is noncontributory. ? ? ?Physical Exam: ?BP 124/77 (BP Location: Left Arm, Patient Position: Sitting, Cuff Size: Large)   Pulse 76   Temp 97.6 ?F (36.4 ?C) (Oral)   Ht '6\' 3"'$  (1.905 m)   Wt (!) 319 lb 1.6 oz (144.7 kg)   BMI 39.88 kg/m?  ?GENERAL: The patient is AO x3, in no acute distress. Obese. ?HEENT: Head is normocephalic and atraumatic. EOMI are intact. Mouth is well hydrated and without lesions. ?NECK: Supple. No masses ?LUNGS: Clear to auscultation. No presence of rhonchi/wheezing/rales. Adequate chest expansion ?HEART: RRR, normal s1 and s2. ?ABDOMEN: Soft, nontender, no guarding, no peritoneal signs, and nondistended. BS +. No masses. ?EXTREMITIES: Without any cyanosis, clubbing, rash, lesions or edema. ?NEUROLOGIC: AOx3, no focal motor deficit. ?SKIN: no jaundice, no rashes ? ?Imaging/Labs: ?as above ? ?I personally reviewed and interpreted the available labs, imaging and  endoscopic files. ? ?Impression and Plan: ?Alex Thomas is a 67 y.o. 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 ? ?The patient was incidentally found to have changes consistent with inflammatory bowel disease.  Given distribution of his disease which confluently involve the colon diffusely with relative sparing o

## 2021-12-21 ENCOUNTER — Telehealth: Payer: Self-pay | Admitting: Pharmacy Technician

## 2021-12-21 ENCOUNTER — Telehealth (INDEPENDENT_AMBULATORY_CARE_PROVIDER_SITE_OTHER): Payer: Self-pay

## 2021-12-21 ENCOUNTER — Other Ambulatory Visit: Payer: Self-pay | Admitting: Pharmacy Technician

## 2021-12-21 DIAGNOSIS — K5190 Ulcerative colitis, unspecified, without complications: Secondary | ICD-10-CM | POA: Insufficient documentation

## 2021-12-21 NOTE — Telephone Encounter (Signed)
Dr. Montez Morita, ?Yes, we will update the therapy plan. ?Thanks

## 2021-12-21 NOTE — Telephone Encounter (Signed)
Hi Kim, ?I will update the note for ulcerative colitis diagnosis. Where else do I need to update this? Can you update this in the orders I placed at the infusion center? ? ?Thanks ?

## 2021-12-21 NOTE — Telephone Encounter (Signed)
Thanks a lot Maudie Mercury, I updated the diagnosis code from yesterday. Thanks ?

## 2021-12-21 NOTE — Telephone Encounter (Signed)
Dr. Montez Morita, ? ?Auth Submission: APPROVED ?Payer: [employer] ?Medication & CPT/J Code(s) submitted: Entyvio (Vedolizumab) [id number] ?Route of submission (phone, fax, portal): PORTAL ?Auth type: Buy/Bill ?Units/visits requests 300 mg d1, d14, d42, q8wks ?Reference number: [id number] ?Approval from: 12/21/21 to 06/22/22  ? ?Patient will be scheduled as soon as possible

## 2021-12-21 NOTE — Telephone Encounter (Signed)
Thanks so much. 

## 2021-12-21 NOTE — Telephone Encounter (Signed)
Dr. Milagros Loll, ? ?Dx code: K57.9 (IBD) will be denied per [place] for Entyvio. ? ?Approved Dx codes are: ?Crohns: K50.90 ?Ulcerative Coliits: K51.0 ? ?Chart notes will need to reflect approved Dx codes for [facility]. If you would like to proceed with Entyvio please update Dx code in chart notes. ? ?Thanks ?Kim ? ? ? ? ?

## 2021-12-21 NOTE — Telephone Encounter (Signed)
Cancer Antigen 19-9 lab ordered, patient aware. He will come by the office 12/22/2021 and pick up the order to have drawn at that time. Orders placed at front desk for patient to pick up. ?

## 2021-12-22 DIAGNOSIS — K8301 Primary sclerosing cholangitis: Secondary | ICD-10-CM | POA: Diagnosis not present

## 2021-12-22 NOTE — Telephone Encounter (Signed)
No appointment scheduled as of yet. ?

## 2021-12-23 LAB — CANCER ANTIGEN 19-9: CA 19-9: 16 U/mL (ref <34)

## 2021-12-24 NOTE — Telephone Encounter (Signed)
Still no appointment scheduled.  ?

## 2021-12-28 NOTE — Telephone Encounter (Signed)
No appointment scheduled as of yet. ?

## 2021-12-30 ENCOUNTER — Encounter (HOSPITAL_COMMUNITY): Payer: Self-pay

## 2021-12-30 ENCOUNTER — Ambulatory Visit (HOSPITAL_COMMUNITY)
Admission: RE | Admit: 2021-12-30 | Discharge: 2021-12-30 | Disposition: A | Discharge status: Home / Self Care | Payer: Medicare Other | Source: Ambulatory Visit | Attending: Gastroenterology | Admitting: Gastroenterology

## 2021-12-30 DIAGNOSIS — K760 Fatty (change of) liver, not elsewhere classified: Secondary | ICD-10-CM | POA: Diagnosis not present

## 2021-12-30 DIAGNOSIS — K529 Noninfective gastroenteritis and colitis, unspecified: Secondary | ICD-10-CM | POA: Diagnosis not present

## 2021-12-30 DIAGNOSIS — N200 Calculus of kidney: Secondary | ICD-10-CM | POA: Diagnosis not present

## 2021-12-30 DIAGNOSIS — K5730 Diverticulosis of large intestine without perforation or abscess without bleeding: Secondary | ICD-10-CM | POA: Diagnosis not present

## 2021-12-30 DIAGNOSIS — I700 Atherosclerosis of aorta: Secondary | ICD-10-CM | POA: Diagnosis not present

## 2021-12-30 MED ORDER — IOHEXOL 300 MG/ML  SOLN
100.0000 mL | Freq: Once | INTRAMUSCULAR | Status: AC | PRN
  Administered 2021-12-30: 100 mL via INTRAVENOUS

## 2021-12-30 MED ORDER — SODIUM CHLORIDE (PF) 0.9 % IJ SOLN
INTRAMUSCULAR | Status: AC
  Filled 2021-12-30: qty 50

## 2021-12-30 MED ORDER — BARIUM SULFATE 0.1 % PO SUSP
450.0000 mL | Freq: Once | ORAL | Status: AC
  Administered 2021-12-30: 450 mL via ORAL

## 2021-12-30 MED ORDER — BARIUM SULFATE 0.1 % PO SUSP
ORAL | Status: AC
  Filled 2021-12-30: qty 3

## 2021-12-30 NOTE — Telephone Encounter (Signed)
Tried calling patient no answer at home number and vm full on cell number.  ?

## 2021-12-30 NOTE — Telephone Encounter (Signed)
Patient scheduled for 01/04/2022,01/18/2022,and 02/15/2022. ?

## 2021-12-31 NOTE — Telephone Encounter (Signed)
Patient states he is aware of his date of Entyvio. ?

## 2022-01-04 ENCOUNTER — Ambulatory Visit (INDEPENDENT_AMBULATORY_CARE_PROVIDER_SITE_OTHER): Payer: Medicare Other | Admitting: *Deleted

## 2022-01-04 VITALS — BP 126/82 | HR 58 | Temp 97.7°F | Resp 18 | Ht 75.0 in | Wt 316.2 lb

## 2022-01-04 DIAGNOSIS — K51019 Ulcerative (chronic) pancolitis with unspecified complications: Secondary | ICD-10-CM

## 2022-01-04 MED ORDER — VEDOLIZUMAB 300 MG IV SOLR
300.0000 mg | Freq: Once | INTRAVENOUS | Status: AC
  Administered 2022-01-04: 300 mg via INTRAVENOUS
  Filled 2022-01-04: qty 5

## 2022-01-04 NOTE — Progress Notes (Signed)
Diagnosis: Crohn's Disease ? ?Provider:  Marshell Garfinkel, MD ? ?Procedure: Infusion ? ?IV Type: Peripheral, IV Location: L Antecubital ? ?Entyvio (Vedolizumab),  Dose: 300 mg ? ?Infusion Start Time: 1016 am ? ?Infusion Stop Time: 6803 am ? ?Post Infusion IV Care: Observation period completed and Peripheral IV Discontinued ? ?Discharge: Condition: Good, Destination: Home . AVS provided to patient.  ? ?Performed by:  Oren Beckmann, RN  ?  ?

## 2022-01-11 DIAGNOSIS — L814 Other melanin hyperpigmentation: Secondary | ICD-10-CM | POA: Diagnosis not present

## 2022-01-11 DIAGNOSIS — D216 Benign neoplasm of connective and other soft tissue of trunk, unspecified: Secondary | ICD-10-CM | POA: Diagnosis not present

## 2022-01-11 DIAGNOSIS — L918 Other hypertrophic disorders of the skin: Secondary | ICD-10-CM | POA: Diagnosis not present

## 2022-01-11 DIAGNOSIS — L818 Other specified disorders of pigmentation: Secondary | ICD-10-CM | POA: Diagnosis not present

## 2022-01-11 DIAGNOSIS — D235 Other benign neoplasm of skin of trunk: Secondary | ICD-10-CM | POA: Diagnosis not present

## 2022-01-18 ENCOUNTER — Ambulatory Visit (INDEPENDENT_AMBULATORY_CARE_PROVIDER_SITE_OTHER): Payer: Medicare Other | Admitting: *Deleted

## 2022-01-18 VITALS — BP 120/80 | HR 66 | Temp 98.0°F | Resp 18 | Ht 75.0 in | Wt 315.4 lb

## 2022-01-18 DIAGNOSIS — K51019 Ulcerative (chronic) pancolitis with unspecified complications: Secondary | ICD-10-CM

## 2022-01-18 MED ORDER — VEDOLIZUMAB 300 MG IV SOLR
300.0000 mg | Freq: Once | INTRAVENOUS | Status: AC
  Administered 2022-01-18: 300 mg via INTRAVENOUS
  Filled 2022-01-18: qty 5

## 2022-01-18 NOTE — Progress Notes (Addendum)
Diagnosis: Crohn's Disease ? ?Provider:  Marshell Garfinkel, MD ? ?Procedure: Infusion ? ?IV Type: Peripheral, IV Location: R Hand ? ?Entyvio (Vedolizumab), Dose: 300 mg ? ?Infusion Start Time: 3875 am ? ?Infusion Stop Time: 1024 am ? ?Post Infusion IV Care: Observation period completed and Peripheral IV Discontinued ? ?Discharge: Condition: Good, Destination: Home . AVS provided to patient.  ? ?Performed by:  Oren Beckmann, RN  ?  ?

## 2022-02-15 ENCOUNTER — Ambulatory Visit (INDEPENDENT_AMBULATORY_CARE_PROVIDER_SITE_OTHER): Payer: Medicare Other

## 2022-02-15 VITALS — BP 118/78 | HR 58 | Temp 98.2°F | Resp 16 | Ht 75.0 in | Wt 311.0 lb

## 2022-02-15 DIAGNOSIS — K51019 Ulcerative (chronic) pancolitis with unspecified complications: Secondary | ICD-10-CM

## 2022-02-15 MED ORDER — VEDOLIZUMAB 300 MG IV SOLR
300.0000 mg | INTRAVENOUS | Status: DC
  Administered 2022-02-15: 300 mg via INTRAVENOUS
  Filled 2022-02-15: qty 5

## 2022-02-15 MED ORDER — VEDOLIZUMAB 300 MG IV SOLR: 300.0000 mg | Freq: Once | INTRAVENOUS | Status: DC

## 2022-02-15 NOTE — Progress Notes (Signed)
Diagnosis: Crohn's Disease  Provider:  Marshell Garfinkel, MD  Procedure: Infusion  IV Type: Peripheral, IV Location: R Hand  Venofer (Iron Sucrose), Dose: 300 mg  Infusion Start Time: 5361  Infusion Stop Time: 1119  Post Infusion IV Care: Peripheral IV Discontinued  Discharge: Condition: Good, Destination: Home . AVS provided to patient.   Performed by:  Adelina Mings, LPN

## 2022-03-21 ENCOUNTER — Encounter (INDEPENDENT_AMBULATORY_CARE_PROVIDER_SITE_OTHER): Payer: Self-pay | Admitting: Gastroenterology

## 2022-03-21 ENCOUNTER — Ambulatory Visit (INDEPENDENT_AMBULATORY_CARE_PROVIDER_SITE_OTHER): Payer: Medicare Other | Admitting: Gastroenterology

## 2022-03-21 ENCOUNTER — Telehealth (INDEPENDENT_AMBULATORY_CARE_PROVIDER_SITE_OTHER): Payer: Self-pay | Admitting: *Deleted

## 2022-03-21 VITALS — BP 110/76 | HR 80 | Temp 98.2°F | Ht 75.0 in | Wt 311.2 lb

## 2022-03-21 DIAGNOSIS — R748 Abnormal levels of other serum enzymes: Secondary | ICD-10-CM | POA: Diagnosis not present

## 2022-03-21 DIAGNOSIS — Z79899 Other long term (current) drug therapy: Secondary | ICD-10-CM | POA: Diagnosis not present

## 2022-03-21 DIAGNOSIS — K51019 Ulcerative (chronic) pancolitis with unspecified complications: Secondary | ICD-10-CM

## 2022-03-21 DIAGNOSIS — K8301 Primary sclerosing cholangitis: Secondary | ICD-10-CM

## 2022-03-21 NOTE — Patient Instructions (Addendum)
Continue Entyvio every 8 weeks Start Benefiber fiber supplements daily to increase stool bulk Continue high fiber diet Perform blood workup Perform stool workup on the day of your 4th dose of Entyvio Will consider repeating a colonoscopy 6 months after starting Entyvio - around October Will resend referral to [facility] for further evaluation of [place]

## 2022-03-21 NOTE — Telephone Encounter (Signed)
Patient called - he is scheduled for 05/09/22 at 64 at [facility] location

## 2022-03-21 NOTE — Telephone Encounter (Signed)
Thanks

## 2022-03-21 NOTE — Progress Notes (Signed)
Alex Thomas, M.D. Gastroenterology & Hepatology [facility] [address] [place], [place] [address]  Primary Care Physician: Celene Squibb, MD [place] [address]  I will communicate my assessment and recommendations to the referring MD via EMR.  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  The patient was last seen on 12/11/2021. At that time, the patient was a started on Entyvio.  He was scheduled for a CT enterography. Underwent CT enterography on 12/30/2021 that showed mild inflammation of the colon with sparing of the sigmoid colon and rectum, there was also presence of atrophic left lobe of the liver with numerous surgical clips in the right upper quadrant.  Also had a CA 19-9 level checked which was normal.  He was also referred for evaluation by [facility] but unfortunately he did not answer the calls and has not been able to set up an appointment with them.  Patient reports that he has received 3 doses of Entyvio. He is supposed to receive his next dose in August.  States that he has noticed his knees and wrists hurt after receiving the dose of Entyvio, as well as having some runny nose. States it lasts 2-3 weeks and eventually resolves on its own.  He states the symptoms are very mild.  States that he is still having a bowel movement after having a meal - he reports that he has a BM 3 hours after having a meal which is better than in the past (he is to have immediate urgency after having a meal) but it is still watery. Has noticed high fiber helps having more solid stool. Has 3-4 Bms per day on average. Denies any  melena or hematochezia. He has felt more fatigued with the medication.  He has only drank a couple of beers during summer time but not frequent otherwise.  The patient denies having any nausea, vomiting, fever, chills, hematochezia, melena, hematemesis, abdominal distention, abdominal pain, jaundice, pruritus or weight loss.  Last Colonoscopy: 11/03/2021 for screening purposes. The perianal and digital rectal examinations were normal. The terminal ileum appeared normal. Biopsies were taken with a cold forceps for histology. A 4 mm polyp was found in the cecum. The polyp was semi-sessile. The polyp was removed with a cold snare. Resection and retrieval were complete. Path - inflammatory polyp. Inflammation characterized by altered vascularity, congestion (edema), erythema and granularity was found in a continuous and circumferential pattern from the sigmoid colon to the cecum - this was concerning for IBD. The area 40 cm proximal to anus was spared. This was moderate in severity. Biopsies from cecum/AC, transverse, DC, sigmoid and rectum were taken with a cold forceps for histology. A few large-mouthed diverticula were found in the sigmoid colon. Non-bleeding internal hemorrhoids were found during retroflexion. The hemorrhoids were small.   Path: A. COLON, CECAL, POLYPECTOMY:  -  Inflammatory polyp (1 of 1 fragments)  -  No high-grade dysplasia or malignancy identified   B. SMALL BOWEL, BIOPSY:  -  Chronic active ileitis  -  No granulomas, dysplasia or malignancy identified   C. COLON, CECAL, ASCENDING, BIOPSY:  -  Chronic active colitis  -  No  granulomata, dysplasia or malignancy identified   D. COLON, TRANSVERSE, BIOPSY:  -  Chronic active colitis  -  No granulomata, dysplasia or malignancy identified   E. COLON, DESCENDING, BIOPSY:  -  Chronic active colitis  -  No granulomata, dysplasia or malignancy identified   F. COLON, SIGMOID, BIOPSY:  -  Chronic colitis  -  No acute  inflammation  -  No granulomata, dysplasia or malignancy identified   G. RECTUM, BIOPSY:  -  Chronic colitis, focally active  -  No granulomata, dysplasia or malignancy identified    Due to these findings, I set up follow-up appointment in our clinic.  I also ordered blood work-up which showed markedly elevated alkaline phosphatase up to 378 his CMP with normal total bilirubin of 0.8, AST of 27, ALT of 28, albumin of 4.0, creatinine was 1.34, CRP was elevated 2.4 and CBC was within normal limits.  Also had hepatitis B surface antigen and QuantiFERON checked which were within normal limits.  Given the elevation of his alkaline phosphatase, I ordered an MRCP which showed the following findings   Marked atrophy within the left hepatic lobe with associated intrahepatic biliary dilatation. Diffuse wall thickening and enhancement of the extrahepatic biliary system. Findings could be secondary to chronic cholangitis or segmental sclerosing cholangitis. No focal mass lesion or delayed enhancement identified to strongly suggest cholangiocarcinoma. The visualized colon demonstrates wall thickening and enhancement suspicious for colitis. Postsurgical changes suggesting previous distal gastrectomy. Probable reactive lymph nodes in the porta hepatis.  Past Medical History: Past Medical History:  Diagnosis Date   Bladder cancer [facility])    age 29   History of kidney stones    Multiple gastric ulcers     Past Surgical History: Past Surgical History:  Procedure Laterality Date   APPENDECTOMY     BIOPSY  11/23/2021   Procedure: BIOPSY;  Surgeon: [name], MD;  [facility];  Service: Gastroenterology;;   COLONOSCOPY WITH PROPOFOL N/A 11/23/2021   Procedure: COLONOSCOPY WITH PROPOFOL;  Surgeon: [name], MD;  [facility];  Service: Gastroenterology;  Laterality: N/A;  805 ASA 1   GASTRECTOMY     HERNIA REPAIR     KIDNEY STONE SURGERY     POLYPECTOMY   11/23/2021   Procedure: POLYPECTOMY;  Surgeon: Montez Morita, Quillian Quince, MD;  [facility];  Service: Gastroenterology;;    Family History: Family History  Problem Relation Age of Onset   Colon cancer Mother     Social History: Social History   Tobacco Use  Smoking Status Never   Passive exposure: Past  Smokeless Tobacco Never   Social History   Substance and Sexual Activity  Alcohol Use Not Currently   Social History   Substance and Sexual Activity  Drug Use Not Currently    Allergies: No Known Allergies  Medications: Current Outpatient Medications  Medication Sig Dispense Refill   Vedolizumab (ENTYVIO IV) Inject into the vein.     No current facility-administered medications for this visit.    Review of Systems: GENERAL: negative for malaise, night sweats HEENT: No changes in hearing or vision, no nose bleeds or other nasal problems. NECK: Negative for lumps, goiter, pain and significant neck swelling RESPIRATORY: Negative for cough, wheezing CARDIOVASCULAR: Negative for chest pain, leg swelling, palpitations, orthopnea GI: SEE HPI MUSCULOSKELETAL: Negative for joint pain or swelling, back pain, and muscle pain. SKIN: Negative for lesions, rash PSYCH: Negative for sleep disturbance, mood disorder and recent psychosocial stressors.  HEMATOLOGY Negative for prolonged bleeding, bruising easily, and swollen nodes. ENDOCRINE: Negative for cold or heat intolerance, polyuria, polydipsia and goiter. NEURO: negative for tremor, gait imbalance, syncope and seizures. The remainder of the review of systems is noncontributory.   Physical Exam: BP 110/76 (BP Location: Left Arm, Patient Position: Sitting, Cuff Size: Large)   Pulse 80   Temp 98.2 F (36.8 C) (Oral)   Ht '6\' 3"'$  (1.905 m)   Wt (!) 311 lb 3.2 oz (141.2 kg)   BMI 38.90 kg/m  GENERAL: The patient is AO x3, in no acute distress. Obese. HEENT: Head is normocephalic and atraumatic. EOMI are  intact. Mouth is well hydrated and without lesions. NECK: Supple. No masses LUNGS: Clear to auscultation. No presence of rhonchi/wheezing/rales. Adequate chest expansion HEART: RRR, normal s1 and s2. ABDOMEN: Soft, nontender, no guarding, no peritoneal signs, and nondistended. BS +. No masses. EXTREMITIES: Without any cyanosis, clubbing, rash, lesions or edema. NEUROLOGIC: AOx3, no focal motor deficit. SKIN: no jaundice, no rashes  Imaging/Labs: as above  I personally reviewed and interpreted the available labs, imaging and endoscopic files.  Impression and Plan: Alex Thomas is a 66 y.o. 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  The patient has complex medical conditions that predispose him to life-threatening disease such as malignancy of the colon, biliary tree and gallbladder.  Fortunately, the severity of his ulcerative colitis is mild which is consistent with that UC-PSC pattern.  He has mildly responded to [facility] but has only received the induction doses so far.  We discussed the possibility of checking calprotectin by the time he receives his full dose and considering checking the levels of Entyvio in the future depending on his endoscopic response on repeat colonoscopy in October 2023.  As he has felt some improved use of fiber, he may take fiber supplements and continue a high-fiber diet.  We will obtain We will obtain blood samples for monitoring his CBC, CMP, L37, folic acid, CRP and iron panel.  Regarding his [place], we discussed the importance of having a quaternary center such as to evaluate his case regarding potential clinical trials for [place].  We will send the referral to again to [facility].  Also, I will check for overlapping disorders such as PBC with IgM and antimitochondrial antibodies.  - Continue Entyvio  every 8 weeks - Start Benefiber fiber supplements daily to increase stool bulk - Continue high fiber diet - Check CBC, CMP, D42, folic acid, CRP and iron panel. - Check calprotectin on the day of your 4th dose of Entyvio - Will consider repeating a colonoscopy 6 months after starting Entyvio - around October 2023 - Will resend referral to [facility] for further evaluation of PSC - RTC 3 months  All questions were answered.      [name], MD Gastroenterology and Hepatology [facility]

## 2022-03-23 DIAGNOSIS — R748 Abnormal levels of other serum enzymes: Secondary | ICD-10-CM | POA: Diagnosis not present

## 2022-03-23 DIAGNOSIS — K51019 Ulcerative (chronic) pancolitis with unspecified complications: Secondary | ICD-10-CM | POA: Diagnosis not present

## 2022-03-23 DIAGNOSIS — Z79899 Other long term (current) drug therapy: Secondary | ICD-10-CM | POA: Diagnosis not present

## 2022-03-23 LAB — C-REACTIVE PROTEIN: CRP: 35.1 mg/L — ABNORMAL HIGH (ref <8.0)

## 2022-03-23 LAB — CBC WITH DIFFERENTIAL/PLATELET
Absolute Monocytes: 764 cells/uL (ref 200–950)
Basophils Absolute: 28 cells/uL (ref 0–200)
Basophils Relative: 0.3 %
Eosinophils Absolute: 524 cells/uL — ABNORMAL HIGH (ref 15–500)
Eosinophils Relative: 5.7 %
HCT: 41.8 % (ref 38.5–50.0)
Hemoglobin: 13.8 g/dL (ref 13.2–17.1)
Lymphs Abs: 2328 cells/uL (ref 850–3900)
MCH: 29.4 pg (ref 27.0–33.0)
MCHC: 33 g/dL (ref 32.0–36.0)
MCV: 88.9 fL (ref 80.0–100.0)
MPV: 10.5 fL (ref 7.5–12.5)
Monocytes Relative: 8.3 %
Neutro Abs: 5557 cells/uL (ref 1500–7800)
Neutrophils Relative %: 60.4 %
Platelets: 358 10*3/uL (ref 140–400)
RBC: 4.7 10*6/uL (ref 4.20–5.80)
RDW: 13.4 % (ref 11.0–15.0)
Total Lymphocyte: 25.3 %
WBC: 9.2 10*3/uL (ref 3.8–10.8)

## 2022-03-23 LAB — COMPREHENSIVE METABOLIC PANEL
AG Ratio: 1.1 (calc) (ref 1.0–2.5)
ALT: 17 U/L (ref 9–46)
AST: 20 U/L (ref 10–35)
Albumin: 3.6 g/dL (ref 3.6–5.1)
Alkaline phosphatase (APISO): 294 U/L — ABNORMAL HIGH (ref 35–144)
BUN: 12 mg/dL (ref 7–25)
CO2: 21 mmol/L (ref 20–32)
Calcium: 8.7 mg/dL (ref 8.6–10.3)
Chloride: 109 mmol/L (ref 98–110)
Creat: 1.18 mg/dL (ref 0.70–1.35)
Globulin: 3.3 g/dL (calc) (ref 1.9–3.7)
Glucose, Bld: 91 mg/dL (ref 65–99)
Potassium: 4.6 mmol/L (ref 3.5–5.3)
Sodium: 140 mmol/L (ref 135–146)
Total Bilirubin: 0.5 mg/dL (ref 0.2–1.2)
Total Protein: 6.9 g/dL (ref 6.1–8.1)

## 2022-03-23 LAB — IRON,TIBC AND FERRITIN PANEL
%SAT: 26 % (calc) (ref 20–48)
Ferritin: 41 ng/mL (ref 24–380)
Iron: 74 ug/dL (ref 50–180)
TIBC: 284 mcg/dL (calc) (ref 250–425)

## 2022-03-23 LAB — IGM: IgM, Serum: 57 mg/dL (ref 50–300)

## 2022-03-23 LAB — MITOCHONDRIAL ANTIBODIES: Mitochondrial M2 Ab, IgG: <=20 U (ref <=20.0)

## 2022-03-23 LAB — B12 AND FOLATE PANEL
Folate: 6.4 ng/mL
Vitamin B-12: 252 pg/mL (ref 200–1100)

## 2022-03-24 ENCOUNTER — Other Ambulatory Visit (INDEPENDENT_AMBULATORY_CARE_PROVIDER_SITE_OTHER): Payer: Self-pay | Admitting: Gastroenterology

## 2022-03-24 DIAGNOSIS — E538 Deficiency of other specified B group vitamins: Secondary | ICD-10-CM

## 2022-03-24 MED ORDER — FOLIC ACID 1 MG PO TABS
1.0000 mg | ORAL_TABLET | Freq: Every day | ORAL | 1 refills | Status: DC
Start: 2022-03-24 — End: 2023-03-29

## 2022-03-24 MED ORDER — VITAMIN B-12 1000 MCG SL SUBL: 2.0000 | SUBLINGUAL_TABLET | Freq: Every day | SUBLINGUAL | 1 refills | Status: AC

## 2022-03-28 LAB — CALPROTECTIN: Calprotectin: 994 mcg/g — ABNORMAL HIGH

## 2022-04-01 ENCOUNTER — Telehealth: Payer: Self-pay | Admitting: Pharmacy Technician

## 2022-04-01 NOTE — Telephone Encounter (Signed)
Dr. Jenetta Downer,  Patient would like to be enrolled in Patient Assitance Program (free drug) due to financial cost/burden. Awaiting patient to provide proof of income to proceed with application process.  Forms will be faxed to Dr. Catalina Lunger for signature. Awaiting call back from office. Will need correct fax number. Phone: [phone] Fax:

## 2022-04-04 NOTE — Telephone Encounter (Signed)
Alex Thomas,   My Phone # is [phone]  My Fax # is [phone].  Thanks for your help!

## 2022-04-06 NOTE — Telephone Encounter (Signed)
Crystal, F/u: [facility] Patient Assistance Program (free drug)  I have received the signed forms from Dr. Catalina Lunger.  Thanks for your assistance. I have spoken with patient and he is aware I will need proof of income to enroll him in the PAP. Patient states he will bring proof of income at next scheduled appt 04/12/22.

## 2022-04-11 NOTE — Telephone Encounter (Signed)
Thank You Joelene Millin!!!

## 2022-04-12 ENCOUNTER — Ambulatory Visit (INDEPENDENT_AMBULATORY_CARE_PROVIDER_SITE_OTHER): Payer: Medicare Other

## 2022-04-12 VITALS — BP 122/83 | HR 51 | Temp 97.7°F | Resp 18 | Ht 75.0 in | Wt 311.6 lb

## 2022-04-12 DIAGNOSIS — K51019 Ulcerative (chronic) pancolitis with unspecified complications: Secondary | ICD-10-CM

## 2022-04-12 MED ORDER — VEDOLIZUMAB 300 MG IV SOLR
300.0000 mg | INTRAVENOUS | Status: DC
  Administered 2022-04-12: 300 mg via INTRAVENOUS
  Filled 2022-04-12: qty 5

## 2022-04-12 NOTE — Progress Notes (Signed)
Diagnosis: Ulcerative Pancolitis  Provider:  Marshell Garfinkel, MD  Procedure: Infusion  IV Type: Peripheral, IV Location: L Hand  Entyvio (Vedolizumab), Dose: 300 mg  Infusion Start Time: 1443  Infusion Stop Time: 6016  Post Infusion IV Care: Peripheral IV Discontinued  Discharge: Condition: Good, Destination: Home . AVS provided to patient.   Performed by:  Arnoldo Morale, RN

## 2022-04-18 NOTE — Telephone Encounter (Signed)
Tried calling patient, no answer.

## 2022-04-18 NOTE — Telephone Encounter (Signed)
Dr. Jenetta Downer, Juluis Rainier note:  Patient has been approved for Entyvio PAP.(Free drug) Entyvio connect: [phone] Id: [id number] Date: 04/18/22 - 09/11/22

## 2022-04-18 NOTE — Telephone Encounter (Signed)
We will need to check his calprotectin dose after he has received the fourth dose of Entyvio as this would support increasing the dosage.

## 2022-04-18 NOTE — Telephone Encounter (Signed)
Spoke with the patient and he is aware it will be free,but wants to know if the frequency will be changed to Q 4 weeks as he feels like the every 8 weeks is not working well for him.

## 2022-04-18 NOTE — Telephone Encounter (Signed)
Thanks a lot [employer]!

## 2022-04-19 ENCOUNTER — Other Ambulatory Visit (INDEPENDENT_AMBULATORY_CARE_PROVIDER_SITE_OTHER): Payer: Self-pay

## 2022-04-19 DIAGNOSIS — K51019 Ulcerative (chronic) pancolitis with unspecified complications: Secondary | ICD-10-CM

## 2022-04-19 NOTE — Telephone Encounter (Signed)
Patient made aware of all and I have mailed him the lab orders to have done after he receives next infusion.

## 2022-05-09 DIAGNOSIS — K8301 Primary sclerosing cholangitis: Secondary | ICD-10-CM | POA: Diagnosis not present

## 2022-06-03 ENCOUNTER — Encounter: Payer: Self-pay | Admitting: Gastroenterology

## 2022-06-07 ENCOUNTER — Ambulatory Visit (INDEPENDENT_AMBULATORY_CARE_PROVIDER_SITE_OTHER): Payer: Medicare Other

## 2022-06-07 VITALS — BP 108/73 | HR 59 | Temp 97.8°F | Resp 18 | Ht 75.0 in | Wt 308.0 lb

## 2022-06-07 DIAGNOSIS — K51018 Ulcerative (chronic) pancolitis with other complication: Secondary | ICD-10-CM | POA: Diagnosis not present

## 2022-06-07 DIAGNOSIS — K51019 Ulcerative (chronic) pancolitis with unspecified complications: Secondary | ICD-10-CM

## 2022-06-07 DIAGNOSIS — K8301 Primary sclerosing cholangitis: Secondary | ICD-10-CM | POA: Diagnosis not present

## 2022-06-07 MED ORDER — VEDOLIZUMAB 300 MG IV SOLR
300.0000 mg | INTRAVENOUS | Status: DC
  Administered 2022-06-07: 300 mg via INTRAVENOUS
  Filled 2022-06-07: qty 5

## 2022-06-07 NOTE — Progress Notes (Signed)
Diagnosis: Crohn's Disease  Provider:  Marshell Garfinkel MD  Procedure: Infusion  IV Type: Peripheral, IV Location: L Antecubital  Entyvio (Vedolizumab), Dose: 300 mg  Infusion Start Time: 7158  Infusion Stop Time: 1005  Post Infusion IV Care: Peripheral IV Discontinued  Discharge: Condition: Good, Destination: Home . AVS provided to patient.   Performed by:  Koren Shiver, RN

## 2022-06-23 ENCOUNTER — Ambulatory Visit (INDEPENDENT_AMBULATORY_CARE_PROVIDER_SITE_OTHER): Payer: Medicare Other | Admitting: Gastroenterology

## 2022-07-07 ENCOUNTER — Other Ambulatory Visit: Payer: Self-pay | Admitting: Pharmacy Technician

## 2022-08-02 ENCOUNTER — Ambulatory Visit (INDEPENDENT_AMBULATORY_CARE_PROVIDER_SITE_OTHER): Payer: Medicare Other

## 2022-08-02 VITALS — BP 123/79 | HR 57 | Temp 97.6°F | Resp 18 | Ht 75.0 in | Wt 318.0 lb

## 2022-08-02 DIAGNOSIS — K51019 Ulcerative (chronic) pancolitis with unspecified complications: Secondary | ICD-10-CM | POA: Diagnosis not present

## 2022-08-02 MED ORDER — VEDOLIZUMAB 300 MG IV SOLR
300.0000 mg | INTRAVENOUS | Status: DC
  Administered 2022-08-02: 300 mg via INTRAVENOUS
  Filled 2022-08-02: qty 5

## 2022-08-02 NOTE — Progress Notes (Signed)
Diagnosis: Ulcerative pancolitis  Provider:  Marshell Garfinkel MD  Procedure: Infusion  IV Type: Peripheral, IV Location: R Hand  Entyvio (Vedolizumab), Dose: 300 mg  Infusion Start Time: 3016  Infusion Stop Time: 0109  Post Infusion IV Care: Peripheral IV Discontinued  Discharge: Condition: Good, Destination: Home . AVS provided to patient.   Performed by:  Arnoldo Morale, RN

## 2022-08-03 ENCOUNTER — Telehealth: Payer: Self-pay | Admitting: Pharmacy Technician

## 2022-08-03 NOTE — Telephone Encounter (Signed)
PA RENEWAL - [place] Pt has transferred to [place]: APPROVED Payer: [employer] Medication & CPT/J Code(s) submitted: Entyvio (Vedolizumab) [phone] Route of submission (phone, fax, portal): PORTAL Phone # Fax # Auth type: Buy/Bill Units/visits requested: '300MG'$  Q8WKS Reference number: [id number] Approval from: 08/03/22 to 08/04/23

## 2022-08-16 ENCOUNTER — Encounter (INDEPENDENT_AMBULATORY_CARE_PROVIDER_SITE_OTHER): Payer: Self-pay | Admitting: *Deleted

## 2022-08-16 ENCOUNTER — Ambulatory Visit (INDEPENDENT_AMBULATORY_CARE_PROVIDER_SITE_OTHER): Payer: Medicare Other | Admitting: Gastroenterology

## 2022-08-16 ENCOUNTER — Encounter (INDEPENDENT_AMBULATORY_CARE_PROVIDER_SITE_OTHER): Payer: Self-pay | Admitting: Gastroenterology

## 2022-08-16 VITALS — BP 119/77 | HR 65 | Temp 97.1°F | Ht 75.0 in | Wt 318.4 lb

## 2022-08-16 DIAGNOSIS — K51019 Ulcerative (chronic) pancolitis with unspecified complications: Secondary | ICD-10-CM | POA: Diagnosis not present

## 2022-08-16 DIAGNOSIS — Z1321 Encounter for screening for nutritional disorder: Secondary | ICD-10-CM | POA: Insufficient documentation

## 2022-08-16 DIAGNOSIS — E559 Vitamin D deficiency, unspecified: Secondary | ICD-10-CM | POA: Diagnosis not present

## 2022-08-16 MED ORDER — PEG 3350-KCL-NA BICARB-NACL 420 G PO SOLR: 4000.0000 mL | Freq: Once | ORAL | 0 refills | Status: AC

## 2022-08-16 NOTE — Patient Instructions (Addendum)
We will check some basic labs today and get you scheduled for colonoscopy Please continue your Entyvio every 8 weeks for now Please start a vitamin D supplement as your vitamin D was very low in August, you can start over the counter vitamin D2 or D3(preferred), 1000-2000 IU daily  Follow up 6 months

## 2022-08-16 NOTE — Progress Notes (Signed)
Referring Provider: Celene Squibb, MD Primary Care Physician:  Celene Squibb, MD Primary GI Physician: Jenetta Downer   Chief Complaint  Patient presents with   Follow-up    Patient here today for a follow up on UC. He has been using Entyvio for some time now and seems to be doing well each infusion. Patient goes every 8 weeks for infusions. Has an appointment [facility] this week.   HPI:   Alex Thomas is a 66 y.o. 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 Did report some joint pain and runny nose after his Entyvio doses. Having BMs after every meal with less urgency though stools still watery, having 3-4BMs per day.   He was advised to continue Entyvio q8w, start benefiber daily, CBC, CMP, Z61, Folic acid, CRP and Iron panel, calprotectin on day of 4th dose, consider repeating colonoscopy in October, referral to [facility].   Fecal calprotectin in July was 994, CRP 35, normal iron studies, B12 and folate WNL.   Most recent alk phos 301, Vit D 15, CBC w diff WNL in August.   Present:  Patient states he is having 1-2 BMs per day, more solid than previously. He states that if he overeats he may have looser stools but otherwise feels he is continuing to improve the longer he is on Entyvio. Denies rectal bleeding, melena or abdominal pain. Appetite is good and weight is stable. He reports that fecal urgency usually only occurs now if he is eating certain foods like spicier stuff or overeating which he avoids for the most part. He denies joint pain. Denies fevers, chills or respiratory symptoms.   States that he saw [facility] for [facility] in August. He has upcoming follow up appt with them soon as well.   Last Colonoscopy:Last Colonoscopy: 11/03/2021 for  screening purposes. The perianal and digital rectal examinations were normal. The terminal ileum appeared normal. Biopsies were taken with a cold forceps for histology. A 4 mm polyp was found in the cecum. The polyp was semi-sessile. The polyp was removed with a cold snare. Resection and retrieval were complete. Path - inflammatory polyp. Inflammation characterized by altered vascularity, congestion (edema), erythema and granularity was found in a continuous and circumferential pattern from the sigmoid colon to the cecum - this was concerning for IBD. The area 40 cm proximal to anus was spared. This was moderate in severity. Biopsies from cecum/AC, transverse, DC, sigmoid and rectum were taken with a cold forceps for histology. A few large-mouthed diverticula were found in the sigmoid colon. Non-bleeding internal hemorrhoids were found during retroflexion. The hemorrhoids were small.   Path: A. COLON, CECAL, POLYPECTOMY:  -  Inflammatory polyp (1 of 1 fragments)  -  No high-grade dysplasia or malignancy identified   B. SMALL BOWEL, BIOPSY:  -  Chronic active ileitis  -  No granulomas, dysplasia or malignancy identified   C. COLON, CECAL, ASCENDING, BIOPSY:  -  Chronic active colitis  -  No granulomata, dysplasia or malignancy identified   D. COLON, TRANSVERSE, BIOPSY:  -  Chronic active colitis  -  No granulomata, dysplasia or malignancy identified   E. COLON, DESCENDING, BIOPSY:  -  Chronic active colitis  -  No granulomata, dysplasia or  malignancy identified   F. COLON, SIGMOID, BIOPSY:  -  Chronic colitis  -  No acute inflammation  -  No granulomata, dysplasia or malignancy identified   G. RECTUM, BIOPSY:  -  Chronic colitis, focally active  -  No granulomata, dysplasia or malignancy identified     Past Medical History:  Diagnosis Date   Bladder cancer ([place])    age 42   History of kidney stones    Multiple gastric ulcers     Past Surgical History:  Procedure  Laterality Date   APPENDECTOMY     BIOPSY  11/23/2021   Procedure: BIOPSY;  Surgeon: [name], MD;  [facility];  Service: Gastroenterology;;   COLONOSCOPY WITH PROPOFOL N/A 11/23/2021   Procedure: COLONOSCOPY WITH PROPOFOL;  Surgeon: [name], MD;  [facility];  Service: Gastroenterology;  Laterality: N/A;  805 ASA 1   GASTRECTOMY     HERNIA REPAIR     KIDNEY STONE SURGERY     POLYPECTOMY  11/23/2021   Procedure: POLYPECTOMY;  Surgeon: Montez Morita, Quillian Quince, MD;  [facility]: [facility];  Service: Gastroenterology;;    Current Outpatient Medications  Medication Sig Dispense Refill   Cyanocobalamin (VITAMIN B-12) 1000 MCG SUBL Place 2 tablets (2,000 mcg total) under the tongue daily. 132 tablet 1   folic acid (FOLVITE) 1 MG tablet Take 1 tablet (1 mg total) by mouth daily. 90 tablet 1   Vedolizumab (ENTYVIO IV) Inject into the vein. Every eight weeks     No current facility-administered medications for this visit.    Allergies as of 08/16/2022   (No Known Allergies)    Family History  Problem Relation Age of Onset   Colon cancer Mother     Social History   Socioeconomic History   Marital status: Married    Spouse name: Not on file   Number of children: Not on file   Years of education: Not on file   Highest education level: Not on file  Occupational History   Not on file  Tobacco Use   Smoking status: Never    Passive exposure: Past   Smokeless tobacco: Never  Vaping Use   Vaping Use: Never used  Substance and Sexual Activity   Alcohol use: Not Currently   Drug use: Not Currently   Sexual activity: Not on file  Other Topics Concern   Not on file  Social History Narrative   Not on file   Social Determinants of Health   Financial Resource Strain: Not on file  Food Insecurity: Not on file  Transportation Needs: Not on file  Physical Activity: Not on file  Stress: Not on file  Social Connections: Not  on file   Review of systems General: negative for malaise, night sweats, fever, chills, weight loss Neck: Negative for lumps, goiter, pain and significant neck swelling Resp: Negative for cough, wheezing, dyspnea at rest CV: Negative for chest pain, leg swelling, palpitations, orthopnea GI: denies melena, hematochezia, nausea, vomiting, diarrhea, constipation, dysphagia, odyonophagia, early satiety or unintentional weight loss.  MSK: Negative for joint pain or swelling, back pain, and muscle pain. Derm: Negative for itching or rash Psych: Denies depression, anxiety, memory loss, confusion. No homicidal or suicidal ideation.  Heme: Negative for prolonged bleeding, bruising easily, and swollen nodes. Endocrine: Negative for cold or heat intolerance, polyuria, polydipsia and goiter. Neuro: negative for tremor, gait imbalance, syncope and seizures. The remainder of the review of systems is noncontributory.  Physical Exam:  BP 119/77 (BP Location: Left Arm, Patient Position: Sitting, Cuff Size: Large)   Pulse 65   Temp (!) 97.1 F (36.2 C) (Temporal)   Ht _0  (1.905 m)   Wt (!) 318 lb 6.4 oz (144.4 kg)   BMI 39.80 kg/m  General:   Alert and oriented. No distress noted. Pleasant and cooperative.  Head:  Normocephalic and atraumatic. Eyes:  Conjuctiva clear without scleral icterus. Mouth:  Oral mucosa pink and moist. Good dentition. No lesions. Heart: Normal rate and rhythm, s1 and s2 heart sounds present.  Lungs: Clear lung sounds in all lobes. Respirations equal and unlabored. Abdomen:  +BS, soft, non-tender and non-distended. No rebound or guarding. No HSM or masses noted. Derm: No palmar erythema or jaundice Msk:  Symmetrical without gross deformities. Normal posture. Extremities:  Without edema. Neurologic:  Alert and  oriented x4 Psych:  Alert and cooperative. Normal mood and affect.  Invalid input(s): "6 MONTHS"   ASSESSMENT: Alex Thomas is a 66 y.o. male presenting today  for follow up of UC.  Doing well on Entyvio every 8 weeks, he has been maintained on this since April and continues to feel that his UC symptoms are improving. Having 1-2 BMs per day, mostly solid in nature with occasional looser stools if he eats certain foods. He denies rectal bleeding, melena, abdominal pain, changes in appetite or weight loss. Last fecal calprotectin was 994 in July. He is due for colonoscopy to determine if he is in endoscopic remission given change in therapy was in April. Indications, risks and benefits of procedure discussed in detail with patient. Patient verbalized understanding and is in agreement to proceed with Colonoscopy at this time. Notably his vitamin D levels in August were 15, he is not curently on supplemental Vitamin D, he remains on folic acid an W46. Will start Vitamin D2 or D3 otc 1000-2000 IU daily and plan to recheck this at next visit. Will update basic labs as well today.  In regards to his [facility], he is being followed by [facility]. Initial consult in August 2023 and has follow up in the near future.    PLAN:  Schedule Colonoscopy, ASA II ENDO 1 2. Continue Entyvio q8w for now  3. CBC, CMP, VIt D, B12 and folate  4. Vitamin D2/or D3 1000-2000 IU daily  5. Continue to follow with [place] [facility] for [facility]  All questions were answered, patient verbalized understanding and is in agreement with plan as outlined above.   Follow Up: 3 months   Arlisha Patalano L. Alver Sorrow, MSN, APRN, AGNP-C Adult-Gerontology Nurse Practitioner [facility] Gastroenterology at [facility]  I have reviewed the note and agree with the APP's assessment as described in this progress note  Patient presenting symptomatic improvement on Entyvio every 8 weeks. Currently being followed at [facility]. We will proceed with colonoscopy to determine endoscopic remission - may perform this with chromoendoscopy given high risk of malignancy.  [name], MD Gastroenterology and  Hepatology [facility]

## 2022-08-17 LAB — COMPREHENSIVE METABOLIC PANEL
ALT: 40 IU/L (ref 0–44)
AST: 41 IU/L — ABNORMAL HIGH (ref 0–40)
Albumin/Globulin Ratio: 1.2 (ref 1.2–2.2)
Albumin: 3.8 g/dL — ABNORMAL LOW (ref 3.9–4.9)
Alkaline Phosphatase: 490 IU/L — ABNORMAL HIGH (ref 44–121)
BUN/Creatinine Ratio: 9 — ABNORMAL LOW (ref 10–24)
BUN: 10 mg/dL (ref 8–27)
Bilirubin Total: 0.6 mg/dL (ref 0.0–1.2)
CO2: 23 mmol/L (ref 20–29)
Calcium: 9 mg/dL (ref 8.6–10.2)
Chloride: 105 mmol/L (ref 96–106)
Creatinine, Ser: 1.15 mg/dL (ref 0.76–1.27)
Globulin, Total: 3.1 g/dL (ref 1.5–4.5)
Glucose: 99 mg/dL (ref 70–99)
Potassium: 5.1 mmol/L (ref 3.5–5.2)
Sodium: 142 mmol/L (ref 134–144)
Total Protein: 6.9 g/dL (ref 6.0–8.5)
eGFR: 70 mL/min/{1.73_m2} (ref >59)

## 2022-08-17 LAB — CBC WITH DIFFERENTIAL/PLATELET
Basophils Absolute: 0.1 10*3/uL (ref 0.0–0.2)
Basos: 1 %
EOS (ABSOLUTE): 0.6 10*3/uL — ABNORMAL HIGH (ref 0.0–0.4)
Eos: 7 %
Hematocrit: 41.2 % (ref 37.5–51.0)
Hemoglobin: 13.5 g/dL (ref 13.0–17.7)
Immature Grans (Abs): 0 10*3/uL (ref 0.0–0.1)
Immature Granulocytes: 0 %
Lymphocytes Absolute: 2.2 10*3/uL (ref 0.7–3.1)
Lymphs: 26 %
MCH: 29.7 pg (ref 26.6–33.0)
MCHC: 32.8 g/dL (ref 31.5–35.7)
MCV: 91 fL (ref 79–97)
Monocytes Absolute: 0.6 10*3/uL (ref 0.1–0.9)
Monocytes: 7 %
Neutrophils Absolute: 5.2 10*3/uL (ref 1.4–7.0)
Neutrophils: 59 %
Platelets: 319 10*3/uL (ref 150–450)
RBC: 4.54 x10E6/uL (ref 4.14–5.80)
RDW: 13.6 % (ref 11.6–15.4)
WBC: 8.7 10*3/uL (ref 3.4–10.8)

## 2022-08-17 LAB — VITAMIN B12: Vitamin B-12: >2000 pg/mL — ABNORMAL HIGH (ref 232–1245)

## 2022-08-17 LAB — FOLATE: Folate: 6.6 ng/mL (ref >3.0)

## 2022-08-17 NOTE — Addendum Note (Signed)
Addended by: [name] on: 08/17/2022 12:34 AM   Modules accepted: Level of Service

## 2022-08-18 DIAGNOSIS — K8301 Primary sclerosing cholangitis: Secondary | ICD-10-CM | POA: Diagnosis not present

## 2022-08-29 DIAGNOSIS — K760 Fatty (change of) liver, not elsewhere classified: Secondary | ICD-10-CM | POA: Insufficient documentation

## 2022-08-29 DIAGNOSIS — R809 Proteinuria, unspecified: Secondary | ICD-10-CM | POA: Diagnosis not present

## 2022-08-29 DIAGNOSIS — K5100 Ulcerative (chronic) pancolitis without complications: Secondary | ICD-10-CM | POA: Insufficient documentation

## 2022-08-29 DIAGNOSIS — E559 Vitamin D deficiency, unspecified: Secondary | ICD-10-CM | POA: Diagnosis not present

## 2022-08-29 DIAGNOSIS — E785 Hyperlipidemia, unspecified: Secondary | ICD-10-CM | POA: Diagnosis not present

## 2022-08-29 DIAGNOSIS — Z87442 Personal history of urinary calculi: Secondary | ICD-10-CM | POA: Diagnosis not present

## 2022-08-29 DIAGNOSIS — K8301 Primary sclerosing cholangitis: Secondary | ICD-10-CM | POA: Insufficient documentation

## 2022-08-29 DIAGNOSIS — R109 Unspecified abdominal pain: Secondary | ICD-10-CM | POA: Diagnosis not present

## 2022-08-29 DIAGNOSIS — Z23 Encounter for immunization: Secondary | ICD-10-CM | POA: Diagnosis not present

## 2022-08-29 DIAGNOSIS — Z713 Dietary counseling and surveillance: Secondary | ICD-10-CM | POA: Diagnosis not present

## 2022-08-29 DIAGNOSIS — R310 Gross hematuria: Secondary | ICD-10-CM | POA: Diagnosis not present

## 2022-09-13 ENCOUNTER — Telehealth: Payer: Self-pay | Admitting: *Deleted

## 2022-09-13 ENCOUNTER — Other Ambulatory Visit: Payer: Self-pay

## 2022-09-13 NOTE — Telephone Encounter (Signed)
PA submitted via [employer]. Auth# [id number], DOS: Sep 23, 2022 - Dec 22, 2022

## 2022-09-16 DIAGNOSIS — E559 Vitamin D deficiency, unspecified: Secondary | ICD-10-CM | POA: Diagnosis not present

## 2022-09-20 DIAGNOSIS — Z0000 Encounter for general adult medical examination without abnormal findings: Secondary | ICD-10-CM | POA: Diagnosis not present

## 2022-09-20 DIAGNOSIS — Z87442 Personal history of urinary calculi: Secondary | ICD-10-CM | POA: Diagnosis not present

## 2022-09-20 DIAGNOSIS — K8301 Primary sclerosing cholangitis: Secondary | ICD-10-CM | POA: Diagnosis not present

## 2022-09-20 DIAGNOSIS — R809 Proteinuria, unspecified: Secondary | ICD-10-CM | POA: Diagnosis not present

## 2022-09-20 DIAGNOSIS — E785 Hyperlipidemia, unspecified: Secondary | ICD-10-CM | POA: Diagnosis not present

## 2022-09-20 DIAGNOSIS — E559 Vitamin D deficiency, unspecified: Secondary | ICD-10-CM | POA: Diagnosis not present

## 2022-09-20 DIAGNOSIS — K760 Fatty (change of) liver, not elsewhere classified: Secondary | ICD-10-CM | POA: Diagnosis not present

## 2022-09-20 DIAGNOSIS — K5100 Ulcerative (chronic) pancolitis without complications: Secondary | ICD-10-CM | POA: Diagnosis not present

## 2022-09-23 ENCOUNTER — Other Ambulatory Visit (INDEPENDENT_AMBULATORY_CARE_PROVIDER_SITE_OTHER): Payer: Self-pay | Admitting: Gastroenterology

## 2022-09-23 ENCOUNTER — Ambulatory Visit (HOSPITAL_COMMUNITY): Payer: Medicare Other | Admitting: Anesthesiology

## 2022-09-23 ENCOUNTER — Encounter (HOSPITAL_COMMUNITY): Payer: Self-pay | Admitting: Gastroenterology

## 2022-09-23 ENCOUNTER — Ambulatory Visit (HOSPITAL_COMMUNITY)
Admission: RE | Admit: 2022-09-23 | Discharge: 2022-09-23 | Disposition: A | Discharge status: Home / Self Care | Payer: Medicare Other | Attending: Gastroenterology | Admitting: Gastroenterology

## 2022-09-23 ENCOUNTER — Encounter (HOSPITAL_COMMUNITY)
Admission: RE | Disposition: A | Discharge status: Home / Self Care | Payer: Self-pay | Source: Home / Self Care | Attending: Gastroenterology

## 2022-09-23 ENCOUNTER — Other Ambulatory Visit: Payer: Self-pay

## 2022-09-23 ENCOUNTER — Ambulatory Visit (HOSPITAL_BASED_OUTPATIENT_CLINIC_OR_DEPARTMENT_OTHER): Payer: Medicare Other | Admitting: Anesthesiology

## 2022-09-23 DIAGNOSIS — K8301 Primary sclerosing cholangitis: Secondary | ICD-10-CM | POA: Diagnosis not present

## 2022-09-23 DIAGNOSIS — D125 Benign neoplasm of sigmoid colon: Secondary | ICD-10-CM | POA: Diagnosis not present

## 2022-09-23 DIAGNOSIS — Z6838 Body mass index (BMI) 38.0-38.9, adult: Secondary | ICD-10-CM | POA: Insufficient documentation

## 2022-09-23 DIAGNOSIS — Z8551 Personal history of malignant neoplasm of bladder: Secondary | ICD-10-CM | POA: Insufficient documentation

## 2022-09-23 DIAGNOSIS — Z79899 Other long term (current) drug therapy: Secondary | ICD-10-CM | POA: Insufficient documentation

## 2022-09-23 DIAGNOSIS — Z1211 Encounter for screening for malignant neoplasm of colon: Secondary | ICD-10-CM | POA: Diagnosis not present

## 2022-09-23 DIAGNOSIS — K523 Indeterminate colitis: Secondary | ICD-10-CM

## 2022-09-23 DIAGNOSIS — K6389 Other specified diseases of intestine: Secondary | ICD-10-CM

## 2022-09-23 DIAGNOSIS — K51919 Ulcerative colitis, unspecified with unspecified complications: Secondary | ICD-10-CM | POA: Diagnosis not present

## 2022-09-23 DIAGNOSIS — K5190 Ulcerative colitis, unspecified, without complications: Secondary | ICD-10-CM

## 2022-09-23 DIAGNOSIS — K635 Polyp of colon: Secondary | ICD-10-CM | POA: Insufficient documentation

## 2022-09-23 DIAGNOSIS — K51019 Ulcerative (chronic) pancolitis with unspecified complications: Secondary | ICD-10-CM

## 2022-09-23 DIAGNOSIS — K5140 Inflammatory polyps of colon without complications: Secondary | ICD-10-CM | POA: Diagnosis not present

## 2022-09-23 HISTORY — PX: BIOPSY: SHX5522

## 2022-09-23 HISTORY — PX: SCLEROTHERAPY: SHX6841

## 2022-09-23 HISTORY — PX: COLONOSCOPY WITH PROPOFOL: SHX5780

## 2022-09-23 SURGERY — COLONOSCOPY WITH PROPOFOL
Anesthesia: General

## 2022-09-23 MED ORDER — LIDOCAINE HCL (CARDIAC) PF 100 MG/5ML IV SOSY
PREFILLED_SYRINGE | INTRAVENOUS | Status: DC | PRN
  Administered 2022-09-23: 100 mg via INTRATRACHEAL

## 2022-09-23 MED ORDER — EPHEDRINE SULFATE-NACL 50-0.9 MG/10ML-% IV SOSY
PREFILLED_SYRINGE | INTRAVENOUS | Status: DC | PRN
  Administered 2022-09-23 (×2): 10 mg via INTRAVENOUS
  Administered 2022-09-23: 5 mg via INTRAVENOUS
  Administered 2022-09-23: 10 mg via INTRAVENOUS

## 2022-09-23 MED ORDER — LACTATED RINGERS IV SOLN: INTRAVENOUS | Status: DC | PRN

## 2022-09-23 MED ORDER — SPOT INK MARKER SYRINGE KIT
PACK | SUBMUCOSAL | Status: DC | PRN
  Administered 2022-09-23: 1 mL via SUBMUCOSAL

## 2022-09-23 MED ORDER — PROPOFOL 10 MG/ML IV BOLUS
INTRAVENOUS | Status: DC | PRN
  Administered 2022-09-23: 50 mg via INTRAVENOUS
  Administered 2022-09-23 (×2): 25 mg via INTRAVENOUS

## 2022-09-23 MED ORDER — PHENYLEPHRINE HCL (PRESSORS) 10 MG/ML IV SOLN
INTRAVENOUS | Status: DC | PRN
  Administered 2022-09-23: 80 ug via INTRAVENOUS
  Administered 2022-09-23 (×3): 160 ug via INTRAVENOUS

## 2022-09-23 MED ORDER — LACTATED RINGERS IV SOLN: INTRAVENOUS | Status: DC

## 2022-09-23 MED ORDER — PROPOFOL 500 MG/50ML IV EMUL
INTRAVENOUS | Status: DC | PRN
  Administered 2022-09-23: 100 ug/kg/min via INTRAVENOUS

## 2022-09-23 NOTE — Discharge Instructions (Signed)
You are being discharged to home.  Resume your previous diet.  We are waiting for your pathology results.  Your physician has recommended a repeat colonoscopy in one year for surveillance.

## 2022-09-23 NOTE — Anesthesia Postprocedure Evaluation (Signed)
Anesthesia Post Note  Patient: Alex Thomas  Procedure(s) Performed: COLONOSCOPY WITH PROPOFOL BIOPSY SCLEROTHERAPY  Patient location during evaluation: Endoscopy Anesthesia Type: General Level of consciousness: awake and alert Pain management: pain level controlled Vital Signs Assessment: post-procedure vital signs reviewed and stable Respiratory status: spontaneous breathing, nonlabored ventilation, respiratory function stable and patient connected to nasal cannula oxygen Cardiovascular status: blood pressure returned to baseline and stable Postop Assessment: no apparent nausea or vomiting Anesthetic complications: no   There were no known notable events for this encounter.   Last Vitals:  Vitals:   09/23/22 1010 09/23/22 1214  BP: 131/88 119/72  Pulse: 79 85  Resp: 19 13  Temp: (!) 36.4 C 36.6 C  SpO2: 95% 96%    Last Pain:  Vitals:   09/23/22 1214  TempSrc: Oral  PainSc: 0-No pain                 Trixie Rude

## 2022-09-23 NOTE — Anesthesia Preprocedure Evaluation (Signed)
Anesthesia Evaluation  Patient identified by MRN, date of birth, ID band Patient awake    Reviewed: Allergy & Precautions, H&P , NPO status , Patient's Chart, lab work & pertinent test results, reviewed documented beta blocker date and time   Airway Mallampati: II  TM Distance: >3 FB Neck ROM: full    Dental no notable dental hx.    Pulmonary neg pulmonary ROS   Pulmonary exam normal breath sounds clear to auscultation       Cardiovascular Exercise Tolerance: Good negative cardio ROS  Rhythm:regular Rate:Normal     Neuro/Psych negative neurological ROS  negative psych ROS   GI/Hepatic Neg liver ROS, PUD,,,  Endo/Other    Morbid obesity  Renal/GU negative Renal ROS  negative genitourinary   Musculoskeletal   Abdominal   Peds  Hematology negative hematology ROS (+)   Anesthesia Other Findings   Reproductive/Obstetrics negative OB ROS                             Anesthesia Physical Anesthesia Plan  ASA: 3  Anesthesia Plan: General   Post-op Pain Management: Minimal or no pain anticipated   Induction:   PONV Risk Score and Plan: Propofol infusion and TIVA  Airway Management Planned: Natural Airway and Nasal Cannula  Additional Equipment:   Intra-op Plan:   Post-operative Plan:   Informed Consent: I have reviewed the patients History and Physical, chart, labs and discussed the procedure including the risks, benefits and alternatives for the proposed anesthesia with the patient or authorized representative who has indicated his/her understanding and acceptance.       Plan Discussed with: CRNA  Anesthesia Plan Comments:         Anesthesia Quick Evaluation

## 2022-09-23 NOTE — Op Note (Signed)
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 Estimated Blood Loss:     Estimated blood loss: none. Procedure:                Pre-Anesthesia Assessment:                           - Prior to the procedure, a History and Physical                            was performed, and patient medications, allergies                            and sensitivities were reviewed. The patient's                            tolerance of previous anesthesia was reviewed.                           - The risks and benefits of the procedure and the                            sedation options and risks were discussed with the                            patient. All questions were answered and informed                            consent was obtained.                           - ASA Grade Assessment: III - A patient with severe                            systemic disease.  After obtaining informed consent, the colonoscope                            was passed under direct vision. Throughout the                            procedure, the patient's blood pressure, pulse, and                            oxygen saturations were monitored continuously. The                            [id number]-[id number] ([id number]) scope was introduced  through                            the anus and advanced to the the terminal ileum.                            The colonoscopy was performed without difficulty.                            The patient tolerated the procedure well. The                            quality of the bowel preparation was good. Scope In: 11:11:04 AM Scope Out: 12:10:07 PM Scope Withdrawal Time: 0 hours 26 minutes 58 seconds  Total Procedure Duration: 0 hours 59 minutes 3 seconds  Findings:      The perianal and digital rectal examinations were normal.      The terminal ileum appeared normal.      Inflammation was found in a continuous and circumferential pattern from       the descending colon (50 cm from anal verge) to the cecum. This was       graded as Mayo Score 1 (mild, with erythema, decreased vascular pattern,       mild friability), and when compared to the previous examination, the       findings are improved as there were no erosions and vascularity appeared       to be more preserved. No inflammation (Mayo 0) was found distal to 50       cm. Biopsies were taken with a cold forceps for histology every 10 cm.      One 15 to 20 mm mucosal nodularity was found in the mid ascending colon.       Area was successfully injected with 20 mL Eleview for lesion assessment,       but the lesion could not be lifted adequately and remained very flat so       no polypectomy could be performed. Biopsies were taken with a cold       forceps for histology. A contralateral area 2 cm distal to the       nodularity was tattooed with an injection of 1 mL of Spot (carbon black).      The retroflexed view of the distal rectum and anal verge was normal and       showed no anal or rectal abnormalities. Impression:               -  The examined portion of the ileum was normal.                           - Mild (Mayo Score 1) ulcerative colitis, improved                            since the last examination. Biopsied.                            - Mucosal nodule in the mid ascending colon.                            Injected. Biopsied. Tattooed.                           - The distal rectum and anal verge are normal on                            retroflexion view. Moderate Sedation:      Per Anesthesia Care Recommendation:           - Discharge patient to home (ambulatory).                           - Resume previous diet.                           - Await pathology results.                           - Check Entyvio levels the day before next dose.                           - Will need repeat fecal calprotectin in next                            clinic appointment.                           - Repeat colonoscopy in 1 year for surveillance.                           - Continue Entyvio every 8 weeks. 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 All rights reserved. The codes documented in this report are preliminary and upon coder review may  be revised to meet current compliance requirements. [name], MD [name],  09/23/2022 12:28:36 PM This report has been signed electronically. Number of Addenda: 0

## 2022-09-23 NOTE — H&P (Signed)
Alex Thomas is an 67 y.o. male.   Chief Complaint: UC HPI: 67 y/o M with PMH PSC, UC, coming for follow up of UC.  The patient denies having any nausea, vomiting, fever, chills, hematochezia, melena, hematemesis, abdominal distention, abdominal pain, diarrhea, jaundice, pruritus or weight loss.Has 2 Bms per day.  Past Medical History:  Diagnosis Date   Bladder cancer [facility])    age 36   History of kidney stones    Multiple gastric ulcers     Past Surgical History:  Procedure Laterality Date   APPENDECTOMY     BIOPSY  11/23/2021   Procedure: BIOPSY;  Surgeon: [name], MD;  Location: [facility];  Service: Gastroenterology;;   COLONOSCOPY WITH PROPOFOL N/A 11/23/2021   Procedure: COLONOSCOPY WITH PROPOFOL;  Surgeon: [name], MD;  Location: [facility];  Service: Gastroenterology;  Laterality: N/A;  805 ASA 1   GASTRECTOMY     HERNIA REPAIR     KIDNEY STONE SURGERY     POLYPECTOMY  11/23/2021   Procedure: POLYPECTOMY;  Surgeon: [name], MD;  Location: [facility];  Service: Gastroenterology;;    Family History  Problem Relation Age of Onset   Colon cancer Mother    Social History:  reports that he has never smoked. He has been exposed to tobacco smoke. He has never used smokeless tobacco. He reports that he does not currently use alcohol. He reports that he does not currently use drugs.  Allergies: No Known Allergies  Medications Prior to Admission  Medication Sig Dispense Refill   FOLIC ACID PO Take 1 tablet by mouth daily.     GAVILYTE-C 240 g solution Take 4,000 mLs by mouth once.     Cyanocobalamin (VITAMIN B-12) 1000 MCG SUBL Place 2 tablets (2,000 mcg total) under the tongue daily. (Patient not taking: Reported on 09/21/2022) 341 tablet 1   folic acid (FOLVITE) 1 MG tablet Take 1 tablet (1 mg total) by mouth daily. (Patient not taking: Reported on 09/21/2022) 90 tablet 1   vedolizumab (ENTYVIO) 300 MG injection Inject  300 mg into the vein every 8 (eight) weeks.      No results found for this or any previous visit (from the past 48 hour(s)). No results found.  Review of Systems  All other systems reviewed and are negative.   Blood pressure 131/88, pulse 79, temperature (!) 97.5 F (36.4 C), temperature source Oral, resp. rate 19, height '6\' 3"'$  (1.905 m), weight (!) 140.6 kg, SpO2 95 %. Physical Exam  GENERAL: The patient is AO x3, in no acute distress. HEENT: Head is normocephalic and atraumatic. EOMI are intact. Mouth is well hydrated and without lesions. NECK: Supple. No masses LUNGS: Clear to auscultation. No presence of rhonchi/wheezing/rales. Adequate chest expansion HEART: RRR, normal s1 and s2. ABDOMEN: Soft, nontender, no guarding, no peritoneal signs, and nondistended. BS +. No masses. EXTREMITIES: Without any cyanosis, clubbing, rash, lesions or edema. NEUROLOGIC: AOx3, no focal motor deficit. SKIN: no jaundice, no rashes  Assessment/Plan  67 y/o M with PMH PSC, UC, coming for follow up of UC.  Will proceed with colonoscopy.  [name], MD 09/23/2022, 11:06 AM

## 2022-09-23 NOTE — Transfer of Care (Signed)
Immediate Anesthesia Transfer of Care Note  Patient: Alex Thomas  Procedure(s) Performed: COLONOSCOPY WITH PROPOFOL BIOPSY SCLEROTHERAPY  Patient Location: PACU  Anesthesia Type:General  Level of Consciousness: awake, alert , oriented, and patient cooperative  Airway & Oxygen Therapy: Patient Spontanous Breathing  Post-op Assessment: Report given to RN, Post -op Vital signs reviewed and stable, and Patient moving all extremities X 4  Post vital signs: Reviewed and stable  Last Vitals:  Vitals Value Taken Time  BP 119/72 09/23/22 1214  Temp 36.6 C 09/23/22 1214  Pulse 85 09/23/22 1214  Resp 13 09/23/22 1214  SpO2 96 % 09/23/22 1214    Last Pain:  Vitals:   09/23/22 1214  TempSrc: Oral  PainSc: 0-No pain      Patients Stated Pain Goal: 7 (38/32/91 9166)  Complications: No notable events documented.

## 2022-09-26 DIAGNOSIS — K51019 Ulcerative (chronic) pancolitis with unspecified complications: Secondary | ICD-10-CM | POA: Diagnosis not present

## 2022-09-27 ENCOUNTER — Encounter (HOSPITAL_COMMUNITY)
Admission: RE | Admit: 2022-09-27 | Discharge: 2022-09-27 | Disposition: A | Discharge status: Home / Self Care | Payer: Medicare Other | Source: Ambulatory Visit | Attending: Gastroenterology | Admitting: Gastroenterology

## 2022-09-27 VITALS — BP 108/74 | HR 57 | Temp 97.5°F | Resp 18 | Ht 75.0 in | Wt 302.0 lb

## 2022-09-27 DIAGNOSIS — K5190 Ulcerative colitis, unspecified, without complications: Secondary | ICD-10-CM | POA: Diagnosis not present

## 2022-09-27 DIAGNOSIS — K51019 Ulcerative (chronic) pancolitis with unspecified complications: Secondary | ICD-10-CM

## 2022-09-27 LAB — SURGICAL PATHOLOGY

## 2022-09-27 MED ORDER — VEDOLIZUMAB 300 MG IV SOLR
300.0000 mg | INTRAVENOUS | Status: DC
  Administered 2022-09-27: 300 mg via INTRAVENOUS
  Filled 2022-09-27: qty 5

## 2022-09-27 NOTE — Progress Notes (Signed)
Diagnosis: Ulcerative Colitis  Provider:  [name] MD  Procedure: Infusion  IV Type: Peripheral, IV Location: L Hand  Entyvio (Vedolizumab), Dose: 300 mg  Infusion Start Time: 0927  Infusion Stop Time: 1005  Post Infusion IV Care: Observation period completed and Peripheral IV Discontinued  Discharge: Condition: Good, Destination: Home . AVS provided to patient.   Performed by:  Hughie Closs, RN

## 2022-09-27 NOTE — Addendum Note (Signed)
Encounter addended by: Baxter Hire, RN on: 09/27/2022 10:15 AM  Actions taken: Therapy plan modified

## 2022-09-28 ENCOUNTER — Encounter: Payer: Self-pay | Admitting: Gastroenterology

## 2022-09-29 ENCOUNTER — Encounter (HOSPITAL_COMMUNITY): Payer: Self-pay | Admitting: Gastroenterology

## 2022-10-04 ENCOUNTER — Telehealth (INDEPENDENT_AMBULATORY_CARE_PROVIDER_SITE_OTHER): Payer: Self-pay

## 2022-10-05 ENCOUNTER — Other Ambulatory Visit: Payer: Self-pay

## 2022-10-06 LAB — VEDOLIZUMAB AND ANTI-VEDO AB
Anti-Vedolizumab Antibody: <25 ng/mL
Vedolizumab: 3.2 ug/mL

## 2022-10-24 ENCOUNTER — Other Ambulatory Visit: Payer: Self-pay

## 2022-10-25 ENCOUNTER — Encounter (HOSPITAL_COMMUNITY)
Admission: RE | Admit: 2022-10-25 | Discharge: 2022-10-25 | Disposition: A | Discharge status: Home / Self Care | Payer: Medicare Other | Source: Ambulatory Visit | Attending: Gastroenterology | Admitting: Gastroenterology

## 2022-10-25 VITALS — BP 110/86 | HR 64 | Temp 97.9°F | Resp 20 | Ht 75.0 in | Wt 304.0 lb

## 2022-10-25 DIAGNOSIS — K51019 Ulcerative (chronic) pancolitis with unspecified complications: Secondary | ICD-10-CM | POA: Diagnosis not present

## 2022-10-25 MED ORDER — VEDOLIZUMAB 300 MG IV SOLR
300.0000 mg | INTRAVENOUS | Status: DC
  Administered 2022-10-25: 300 mg via INTRAVENOUS
  Filled 2022-10-25: qty 5

## 2022-10-25 NOTE — Progress Notes (Signed)
Diagnosis: Crohn's Disease  Provider:  [name] MD  Procedure: Infusion  IV Type: Peripheral, IV Location: L Forearm  Entyvio (Vedolizumab), Dose: 300 mg  Infusion Start Time: 0905  Infusion Stop Time: [id number]  Post Infusion IV Care: Patient declined observation and Peripheral IV Discontinued  Discharge: Condition: Good, Destination: Home . AVS Declined  Performed by:  Baxter Hire, RN

## 2022-11-14 DIAGNOSIS — K8301 Primary sclerosing cholangitis: Secondary | ICD-10-CM | POA: Diagnosis not present

## 2022-11-14 DIAGNOSIS — K5100 Ulcerative (chronic) pancolitis without complications: Secondary | ICD-10-CM | POA: Diagnosis not present

## 2022-11-28 ENCOUNTER — Encounter (HOSPITAL_COMMUNITY)
Admission: RE | Admit: 2022-11-28 | Discharge: 2022-11-28 | Disposition: A | Discharge status: Home / Self Care | Payer: Medicare Other | Source: Ambulatory Visit | Attending: Gastroenterology | Admitting: Gastroenterology

## 2022-11-28 VITALS — BP 127/80 | HR 59 | Temp 97.7°F | Resp 16 | Ht 75.0 in | Wt 302.0 lb

## 2022-11-28 DIAGNOSIS — K51019 Ulcerative (chronic) pancolitis with unspecified complications: Secondary | ICD-10-CM

## 2022-11-28 MED ORDER — VEDOLIZUMAB 300 MG IV SOLR
300.0000 mg | INTRAVENOUS | Status: DC
  Administered 2022-11-28: 300 mg via INTRAVENOUS
  Filled 2022-11-28: qty 5

## 2022-11-28 NOTE — Addendum Note (Signed)
Encounter addended by: Baxter Hire, RN on: 11/28/2022 10:37 AM  Actions taken: Therapy plan modified

## 2022-11-28 NOTE — Progress Notes (Signed)
Diagnosis: Crohn's Disease  Provider:  [name] MD  Procedure: Infusion  IV Type: Peripheral, IV Location: L Forearm  Entyvio (Vedolizumab), Dose: 300 mg  Infusion Start Time: [id number]  Infusion Stop Time: 0933  Post Infusion IV Care: Patient declined observation and Peripheral IV Discontinued  Discharge: Condition: Good, Destination: Home . AVS Provided  Performed by:  Baxter Hire, RN

## 2022-12-02 ENCOUNTER — Emergency Department (HOSPITAL_COMMUNITY): Payer: Medicare Other

## 2022-12-02 ENCOUNTER — Emergency Department (HOSPITAL_COMMUNITY)
Admission: EM | Admit: 2022-12-02 | Discharge: 2022-12-02 | Disposition: A | Discharge status: Home / Self Care | Payer: Medicare Other | Attending: Emergency Medicine | Admitting: Emergency Medicine

## 2022-12-02 ENCOUNTER — Other Ambulatory Visit: Payer: Self-pay

## 2022-12-02 DIAGNOSIS — M79632 Pain in left forearm: Secondary | ICD-10-CM | POA: Diagnosis not present

## 2022-12-02 DIAGNOSIS — Z23 Encounter for immunization: Secondary | ICD-10-CM | POA: Insufficient documentation

## 2022-12-02 DIAGNOSIS — Y92096 Garden or yard of other non-institutional residence as the place of occurrence of the external cause: Secondary | ICD-10-CM | POA: Diagnosis not present

## 2022-12-02 DIAGNOSIS — M25512 Pain in left shoulder: Secondary | ICD-10-CM | POA: Diagnosis not present

## 2022-12-02 DIAGNOSIS — W010XXA Fall on same level from slipping, tripping and stumbling without subsequent striking against object, initial encounter: Secondary | ICD-10-CM | POA: Insufficient documentation

## 2022-12-02 DIAGNOSIS — S5012XA Contusion of left forearm, initial encounter: Secondary | ICD-10-CM | POA: Insufficient documentation

## 2022-12-02 DIAGNOSIS — Z043 Encounter for examination and observation following other accident: Secondary | ICD-10-CM | POA: Diagnosis not present

## 2022-12-02 DIAGNOSIS — W19XXXA Unspecified fall, initial encounter: Secondary | ICD-10-CM

## 2022-12-02 MED ORDER — OXYCODONE-ACETAMINOPHEN 5-325 MG PO TABS: 1.0000 | ORAL_TABLET | Freq: Four times a day (QID) | ORAL | 0 refills | Status: DC | PRN

## 2022-12-02 MED ORDER — OXYCODONE-ACETAMINOPHEN 5-325 MG PO TABS
1.0000 | ORAL_TABLET | Freq: Once | ORAL | Status: AC
  Administered 2022-12-02: 1 via ORAL
  Filled 2022-12-02: qty 1

## 2022-12-02 MED ORDER — TETANUS-DIPHTH-ACELL PERTUSSIS 5-2.5-18.5 LF-MCG/0.5 IM SUSY
0.5000 mL | PREFILLED_SYRINGE | Freq: Once | INTRAMUSCULAR | Status: AC
  Administered 2022-12-02: 0.5 mL via INTRAMUSCULAR
  Filled 2022-12-02: qty 0.5

## 2022-12-02 NOTE — ED Provider Notes (Signed)
[facility] Provider Note   CSN: [id number] Arrival date & time: 12/02/22  1140     History  Chief Complaint  Patient presents with   Fall    Pain in left arm    Alex Thomas is a 67 y.o. male presenting for evaluation of worsening pain in his left forearm after tripping and falling 2 days ago.  He tripped in his yard and landed on his left arm, stating most of his weight fell on his left elbow.  He sustained an abrasion of the elbow, also has some pain in his left shoulder.  He has used Tylenol and Ace wrap's and left arm sling and his pain was tolerated until he moved his left arm this morning while showering and had sudden onset of more severe pain in his left mid forearm.  He did not hit his head with this fall, he is not on blood thinning medications.  No other complaints of pain, limited to the left forearm although he still has some discomfort in the left shoulder as well.   The history is provided by the patient.       Home Medications Prior to Admission medications   Medication Sig Start Date End Date Taking? Authorizing Provider  oxyCODONE-acetaminophen (PERCOCET/ROXICET) 5-325 MG tablet Take 1 tablet by mouth every 6 (six) hours as needed. 12/02/22  Yes Carrye Goller, Almyra Free, PA-C  Cyanocobalamin (VITAMIN B-12) 1000 MCG SUBL Place 2 tablets (2,000 mcg total) under the tongue daily. Patient not taking: Reported on 09/21/2022 03/24/22   [name], MD  folic acid (FOLVITE) 1 MG tablet Take 1 tablet (1 mg total) by mouth daily. Patient not taking: Reported on 09/21/2022 03/24/22   Montez Morita, Quillian Quince, MD  FOLIC ACID PO Take 1 tablet by mouth daily.    [provider]  vedolizumab (ENTYVIO) 300 MG injection Inject 300 mg into the vein every 8 (eight) weeks.    [provider]      Allergies    Patient has no known allergies.    Review of Systems   Review of Systems  Constitutional:  Negative for fever.   Musculoskeletal:  Positive for arthralgias and joint swelling. Negative for back pain, myalgias and neck pain.  Skin:  Positive for wound.  Neurological:  Negative for dizziness, weakness, numbness and headaches.  All other systems reviewed and are negative.   Physical Exam Updated Vital Signs BP (!) 132/96 (BP Location: Right Arm)   Pulse 62   Temp 97.8 F (36.6 C) (Oral)   Resp 18   Ht 6\' 3"  (1.905 m)   Wt (!) 137.4 kg   SpO2 100%   BMI 37.87 kg/m  Physical Exam Vitals and nursing note reviewed.  Constitutional:      Appearance: He is well-developed.  HENT:     Head: Normocephalic and atraumatic.  Eyes:     Conjunctiva/sclera: Conjunctivae normal.  Cardiovascular:     Rate and Rhythm: Normal rate.  Pulmonary:     Effort: Pulmonary effort is normal.  Musculoskeletal:        General: Normal range of motion.     Left shoulder: Bony tenderness present. Normal range of motion.     Cervical back: Normal range of motion.     Comments: Point tender lateral left shoulder at the humeral head.  No palpable deformity.  No significant elbow pain, there is a healing abrasion at the olecranon.  He does have moderate edema  and tenderness to palpation and attempts at supination of the left mid forearm.  Radial pulses intact, no decrease sensation in fingertips, full range of motion of fingers and hand without discomfort.  Less than 2 sec cap refill in fingertips.   Skin:    General: Skin is warm and dry.  Neurological:     Mental Status: He is alert.     ED Results / Procedures / Treatments   Labs (all labs ordered are listed, but only abnormal results are displayed) Labs Reviewed - No data to display  EKG None  Radiology DG Forearm Left  Result Date: 12/02/2022 CLINICAL DATA:  Fall onto left arm, pain in the wrist EXAM: LEFT FOREARM - 2 VIEW COMPARISON:  None Available. FINDINGS: Incidental supracondylar process of the distal humerus, an anatomic variant. Two tiny punctate  foci of metal density present in the mid to distal forearm soft tissues dorsally, the larger measuring 0.4 cm in long axis. No well-defined fracture is observed. Degenerative arthropathy at the first carpometacarpal articulation. IMPRESSION: 1. No fracture identified. 2. Two tiny punctate foci of metal density in the mid to distal forearm soft tissues dorsally, the larger measuring 0.4 cm in long axis. 3. Degenerative arthropathy at the first carpometacarpal articulation. Electronically Signed   By: Van Clines M.D.   On: 12/02/2022 13:01   DG Shoulder Left  Result Date: 12/02/2022 CLINICAL DATA:  Fall onto left arm EXAM: LEFT SHOULDER - 2+ VIEW COMPARISON:  Report from left shoulder radiographs dated 12/08/2011 FINDINGS: Glenohumeral and AC joint alignment normal. Mild spurring of the inferior glenoid. No fracture or acute bony findings. IMPRESSION: 1. No acute findings. 2. Mild spurring of the inferior glenoid. Electronically Signed   By: Van Clines M.D.   On: 12/02/2022 12:58    Procedures Procedures    Medications Ordered in ED Medications  oxyCODONE-acetaminophen (PERCOCET/ROXICET) 5-325 MG per tablet 1 tablet (1 tablet Oral Given 12/02/22 1301)  Tdap (BOOSTRIX) injection 0.5 mL (0.5 mLs Intramuscular Given 12/02/22 1301)    ED Course/ Medical Decision Making/ A&P                             Medical Decision Making Pt presenting with left upper extremity pain after fall 2 days ago,  worse in left forearm today.  Presenting with a very tightly wrapped ace on the forearm per nursing staff.  Pain improving now that it is unwrapped.  Imaging obtained,  no acute fx/dislocation.  No sx suggesting compartment syndrome.    Discussed ice/heat,, elevation, given oxycodone here with sig pain improvement. Small quantity for home use,  prn f/u with ortho for persistent pain or worsened pain.   Amount and/or Complexity of Data Reviewed Radiology: ordered.    Details: Imaging reviewed,  agree with interpretation, negative for acute findings.  We discussed the metal fragment in forearm, patient has no current skin injuries, this has probably been present for many years, he is a retired Microbiologist.  Risk Prescription drug management.           Final Clinical Impression(s) / ED Diagnoses Final diagnoses:  Fall, initial encounter  Acute pain of left shoulder  Pain of left forearm  Contusion of left forearm, initial encounter    Rx / DC Orders ED Discharge Orders          Ordered    oxyCODONE-acetaminophen (PERCOCET/ROXICET) 5-325 MG tablet  Every 6 hours PRN  12/02/22 1450              Evalee Jefferson, PA-C 12/02/22 1704    Milton Ferguson, MD 12/02/22 [phone]

## 2022-12-02 NOTE — ED Notes (Signed)
Left forearm. Velcor splint applied .Marland Kitchen Pt demo how to place on.  Moves all fingers well.

## 2022-12-02 NOTE — ED Triage Notes (Signed)
Pt states he tripped & fell onto his left arm.  States most of his weight fell onto his left elbow.  States he is having extreme pain in his left wrist, forearm & shoulder. Denies hitting his head.

## 2022-12-02 NOTE — Discharge Instructions (Signed)
You may continue to use ice on your forearm which will help with the swelling.  Starting tomorrow will also add a gentle heating pad for 20 minutes twice daily.  Wear compression on your forearm, either the Velcro brace or the Ace wrap, elevation will also be helpful.  Plan to follow-up with Dr. Aline Brochure if your symptoms persist or worsen.  Your x-rays are negative for acute fractures today.

## 2022-12-08 DIAGNOSIS — M25512 Pain in left shoulder: Secondary | ICD-10-CM | POA: Diagnosis not present

## 2022-12-26 ENCOUNTER — Encounter (HOSPITAL_COMMUNITY): Payer: Medicare Other

## 2023-01-23 ENCOUNTER — Encounter (HOSPITAL_COMMUNITY): Admission: RE | Admit: 2023-01-23 | Payer: Medicare Other | Source: Ambulatory Visit

## 2023-01-27 ENCOUNTER — Telehealth (INDEPENDENT_AMBULATORY_CARE_PROVIDER_SITE_OTHER): Payer: Self-pay

## 2023-01-27 NOTE — Telephone Encounter (Signed)
Thanks

## 2023-01-27 NOTE — Telephone Encounter (Signed)
I spoke with the patient he says he has been feeling under the weather and just has not felt like going for his treatments. I advised he would need to reach out to the infusion clinic if he is still interested in getting treatment and free medication. Patient says he will reach out to the clinic to resume.     Desma Mcgregor, RPH  [place], [place], [place]; Kandra Nicolas, RN [place] Huss,  Seems like this patient has missed/cancelled last 2 entyvio appointments. Can you confirm if he is still on it. He is on free drug program. If not active on Entyvio, we can stop getting it.  Thanks, [facility]

## 2023-01-27 NOTE — Telephone Encounter (Signed)
Thanks, please let the patient know it is very important to continue with the scheudled dosing as interruptions in treatment can potentially lead to permanent medication failure and need to switch to other agent.

## 2023-01-27 NOTE — Telephone Encounter (Signed)
I spoke with the patient again and reiterated that he needed to be compliant with treatments. Patient says he has already called and got an appointment with the infusion clinic on Monday 01/30/2023.

## 2023-01-30 ENCOUNTER — Encounter (HOSPITAL_COMMUNITY)
Admission: RE | Admit: 2023-01-30 | Discharge: 2023-01-30 | Disposition: A | Discharge status: Home / Self Care | Payer: Medicare Other | Source: Ambulatory Visit | Attending: Gastroenterology | Admitting: Gastroenterology

## 2023-01-30 VITALS — BP 126/76 | HR 57 | Temp 98.3°F | Resp 12

## 2023-01-30 DIAGNOSIS — K51019 Ulcerative (chronic) pancolitis with unspecified complications: Secondary | ICD-10-CM | POA: Diagnosis not present

## 2023-01-30 MED ORDER — VEDOLIZUMAB 300 MG IV SOLR
300.0000 mg | INTRAVENOUS | Status: DC
  Administered 2023-01-30: 300 mg via INTRAVENOUS
  Filled 2023-01-30: qty 5

## 2023-01-30 NOTE — Addendum Note (Signed)
Encounter addended by: Wyvonne Lenz, RN on: 01/30/2023 9:54 AM  Actions taken: Therapy plan modified

## 2023-01-30 NOTE — Progress Notes (Signed)
Diagnosis: Crohn's Disease  Provider:  Dolores Frame MD  Procedure: Infusion  IV Type: Peripheral, IV Location: R Antecubital  Entyvio (Vedolizumab), Dose: 300 mg  Infusion Start Time: 0858  Infusion Stop Time: 0935  Post Infusion IV Care: Patient declined observation and Peripheral IV Discontinued  Discharge: Condition: Good, Destination: Home . AVS Provided  Performed by:  Claudia Desanctis, RN

## 2023-02-16 ENCOUNTER — Ambulatory Visit (INDEPENDENT_AMBULATORY_CARE_PROVIDER_SITE_OTHER): Payer: Medicare Other | Admitting: Gastroenterology

## 2023-02-16 ENCOUNTER — Encounter (INDEPENDENT_AMBULATORY_CARE_PROVIDER_SITE_OTHER): Payer: Self-pay | Admitting: Gastroenterology

## 2023-02-16 VITALS — BP 115/75 | HR 73 | Temp 98.4°F | Ht 75.0 in | Wt 315.0 lb

## 2023-02-16 DIAGNOSIS — Z79620 Long term (current) use of immunosuppressive biologic: Secondary | ICD-10-CM | POA: Diagnosis not present

## 2023-02-16 DIAGNOSIS — K51019 Ulcerative (chronic) pancolitis with unspecified complications: Secondary | ICD-10-CM | POA: Diagnosis not present

## 2023-02-16 DIAGNOSIS — Z1321 Encounter for screening for nutritional disorder: Secondary | ICD-10-CM | POA: Diagnosis not present

## 2023-02-16 NOTE — Patient Instructions (Addendum)
We will continue with Entyvio every 4 weeks I will order some basic labs and check your vitamin d and b12 levels, continue with current meds/supplements at this time Please continue to follow with [facility] regarding your PSC at the intervals they have recommended I will look into the referral that Dr. Levon Hedger had previously requested regarding the abnormal findings on your pathology with colonoscopy   Follow up 6 months

## 2023-02-16 NOTE — Progress Notes (Addendum)
Referring Provider: Benita Stabile, MD Primary Care Physician:  Benita Stabile, MD Primary GI Physician: Dr. Levon Hedger   Chief Complaint  Patient presents with   Ulcerative Colitis    Follow up on UC. Takes entyvio infusion every 4weeks. Patient states doing well.    HPI:   Alex Thomas is a 67 y.o. 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 Saw [facility] for his [facility] in August, upcoming follow up with them soon.   Recommended to have colonoscopy, continue entyvio q8w, CMP, CBC, Vit D, B12, folate, continue with vitamin D supplements, continue to follow with [facility]  Colonoscopy as outlined below, advised to check entyvio level prior to next dose- [facility] for pathology findings as below   Levels were suboptimal in January, entyvio increased to q4w dosing   Vitamin D levels 15 in August, repeat levels not completed, B12 elevated, advised to decrease dose, CMP, CBC WNL   Present:  States he saw [facility] for his PSC advised they felt meds are controlling things well. Supposed to follow up with them in 1 year. Is not sure he saw IBD clinic, per chart review, no notes indicating that he has.   He states he was having some flu like symptoms back in early may which was causing him to miss his infusions. He wonders if he had covid. Had aches, cough, sore throat. He is feeling better now. Notes that he had some of these side effects initially with entyvio but not now. He Is having a BM  usually BID. Stools are more formed, solid consistency. Denies abdominal pain, rectal bleeding, mucus in stools, melena. Appetite is good. No rectal bleeding or melena. Overall feels that he is doing well.   He is taking 1 Vitamin B12 per day and Vitamin D ( pt thinks 5000 iu), folic acid daily. Denies fatigue, paresthesias.   No joint pain or swelling No rashes or skin changes No vision changes-sees eye doctor regularly  Last Colonoscopy:09/2022  The examined portion of the ileum was normal.                           - Mild (Mayo Score 1) ulcerative colitis, improved                            since the last examination. Biopsied.                           - Mucosal nodule in the mid ascending colon.                            Injected. Biopsied. Tattooed.                           - The distal rectum and anal verge are normal  on                            retroflexion view. (presence of an inflammatory polyp (this was the nodule identified in the ascending colon). had mildly active chronic colitis in multiple segments of the colon with inactive proctitis. 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  This was confirmed by 2 pathologists.  Thorough discussion was held with the patient regarding the significance of low-grade dysplasia. discussed that this is classified as invisible dysplasia.  discussed the importance of having an evaluation at a tertiary center such as [facility] with an inflammatory bowel disease group that can evaluate this further with chromoendoscopy and potential resection if the lesion is visible/resectable.  Also, depending on the findings, he may be better served if there is need for surgical intervention.    Recommendations:  Repeat colonoscopy 1 year   Past Medical History:  Diagnosis Date   Bladder cancer [facility])    age 2   History of kidney stones    Multiple gastric ulcers     Past Surgical History:  Procedure Laterality Date   APPENDECTOMY      BIOPSY  11/23/2021   Procedure: BIOPSY;  Surgeon: Dolores Frame, MD;  [facility];  Service: Gastroenterology;;   BIOPSY  09/23/2022   Procedure: BIOPSY;  Surgeon: Dolores Frame, MD;  [facility];  Service: Gastroenterology;;   COLONOSCOPY WITH PROPOFOL N/A 11/23/2021   Procedure: COLONOSCOPY WITH PROPOFOL;  Surgeon: Dolores Frame, MD;  [facility];  Service: Gastroenterology;  Laterality: N/A;  805 ASA 1   COLONOSCOPY WITH PROPOFOL N/A 09/23/2022   Procedure: COLONOSCOPY WITH PROPOFOL;  Surgeon: Dolores Frame, MD;  [facility];  Service: Gastroenterology;  Laterality: N/A;  1:00pm, asa 2, pt knows to arrive at 10:00   GASTRECTOMY     HERNIA REPAIR     KIDNEY STONE SURGERY     POLYPECTOMY  11/23/2021   Procedure: POLYPECTOMY;  Surgeon: Dolores Frame, MD;  [facility];  Service: Gastroenterology;;   Susa Day  09/23/2022   Procedure: Susa Day;  Surgeon: Marguerita Merles, Reuel Boom, MD;  [facility];  Service: Gastroenterology;;    Current Outpatient Medications  Medication Sig Dispense Refill   Cyanocobalamin (VITAMIN B-12) 1000 MCG SUBL Place 2 tablets (2,000 mcg total) under the tongue daily. 180 tablet 1   folic acid (FOLVITE) 1 MG tablet Take 1 tablet (1 mg total) by mouth daily. 90 tablet 1   vedolizumab (ENTYVIO) 300 MG injection Inject 300 mg into the vein every 8 (eight) weeks.     No current facility-administered medications for this visit.    Allergies as of 02/16/2023   (No Known Allergies)    Family History  Problem Relation Age of Onset   Colon cancer Mother     Social History   Socioeconomic History   Marital status: Married    Spouse name: Not on file   Number of children: Not on file   Years of education: Not on file   Highest education level: Not on file  Occupational History   Not on file  Tobacco Use   Smoking status:  Never    Passive exposure: Past   Smokeless tobacco: Never  Vaping Use   Vaping Use: Never used  Substance and Sexual Activity   Alcohol use: Not Currently   Drug use: Not Currently  Sexual activity: Not on file  Other Topics Concern   Not on file  Social History Narrative   Not on file   Social Determinants of Health   Financial Resource Strain: Not on file  Food Insecurity: Not on file  Transportation Needs: Not on file  Physical Activity: Not on file  Stress: Not on file  Social Connections: Not on file    Review of systems General: negative for malaise, night sweats, fever, chills, weight loss Neck: Negative for lumps, goiter, pain and significant neck swelling Resp: Negative for cough, wheezing, dyspnea at rest CV: Negative for chest pain, leg swelling, palpitations, orthopnea GI: denies melena, hematochezia, nausea, vomiting, diarrhea, constipation, dysphagia, odyonophagia, early satiety or unintentional weight loss.  MSK: Negative for joint pain or swelling, back pain, and muscle pain. Derm: Negative for itching or rash Psych: Denies depression, anxiety, memory loss, confusion. No homicidal or suicidal ideation.  Heme: Negative for prolonged bleeding, bruising easily, and swollen nodes. Endocrine: Negative for cold or heat intolerance, polyuria, polydipsia and goiter. Neuro: negative for tremor, gait imbalance, syncope and seizures. The remainder of the review of systems is noncontributory.  Physical Exam: BP 115/75 (BP Location: Left Arm, Patient Position: Sitting, Cuff Size: Large)   Pulse 73   Temp 98.4 F (36.9 C) (Oral)   Ht 6\' 3"  (1.905 m)   Wt (!) 315 lb (142.9 kg)   BMI 39.37 kg/m  General:   Alert and oriented. No distress noted. Pleasant and cooperative.  Head:  Normocephalic and atraumatic. Eyes:  Conjuctiva clear without scleral icterus. Mouth:  Oral mucosa pink and moist. Good dentition. No lesions. Heart: Normal rate and rhythm, s1 and s2 heart  sounds present.  Lungs: Clear lung sounds in all lobes. Respirations equal and unlabored. Abdomen:  +BS, soft, non-tender and non-distended. No rebound or guarding. No HSM or masses noted. Derm: No palmar erythema or jaundice Msk:  Symmetrical without gross deformities. Normal posture. Extremities:  Without edema. Neurologic:  Alert and  oriented x4 Psych:  Alert and cooperative. Normal mood and affect.  Invalid input(s): "6 MONTHS"   ASSESSMENT: Alex Thomas is a 67 y.o. male presenting today for follow up of ulcerative pan colitis.   Maintained on entyvio and doing well. Colonoscopy in January with mild UC, improved, though pathology with low grade invisible dysplasia, patient was to be referred to [facility] IBD specialty though does not appear he has yet seen them. Entyvio was increased to Q4w dosing in January as levels were suboptimal.  He denies abdominal pain, rectal bleeding melena mucus in stools.  Reports he is having 1-2 formed bowel movements per day.  Feels he is doing well overall on Entyvio.  He is taking supplemental vitamin D, folic acid, B12.  His vitamin D levels last checked in August 2023 were low, will update basic labs with vitamin D and B12 levels, as well as inflammatory markers.  Will continue with Entyvio every 4 weeks for now and we will send another referral to the [facility] specialty IBD clinic  He continues to follow with [facility] for his history of PSC I saw him in March, advised to follow-up with them again in a year.  He had a liver ultrasound in December 2023 that overall looked good other than some possible hepatic stenosis.  He will continue to follow with them for management of his PSC.  PLAN:  CBC, CMP, CRP, fecal cal, VIt D 2. resend referral for [facility] clinic 3. Continue with entyvio  q4 w 4. Continue with folic acid, b12 and vitamin d supplementation 5. Plan for repeat Colonoscopy January 2025 6. Continue to follow with [facility] for [facility]  All questions were answered,  patient verbalized understanding and is in agreement with plan as outlined above.   Follow Up: 6 months   Shenna Brissette L. Jeanmarie Hubert, MSN, APRN, AGNP-C Adult-Gerontology Nurse Practitioner [facility]  I have reviewed the note and agree with the APP's assessment as described in this progress note  The patient had presence of low-grade dysplasia on most recent colonoscopy and was referred for evaluation at a tertiary center for management of invisible dysplasia.  However, it is worrisome that he never follow-up with them even though the referral was sent.  We will send an urgent referral again for him to be evaluated by the IBD clinic given his high risk for colorectal malignancy.  Seems that his symptoms are much more controlled with Entyvio which is reassuring.  Will continue Entyvio every 4 weeks.  Katrinka Blazing, MD Gastroenterology and Hepatology [facility] Gastroenterology

## 2023-02-27 ENCOUNTER — Encounter (HOSPITAL_COMMUNITY): Payer: Medicare Other

## 2023-02-27 ENCOUNTER — Telehealth: Payer: Self-pay | Admitting: *Deleted

## 2023-02-27 NOTE — Telephone Encounter (Signed)
Pt called in and reports he will check his [facility] mychart.

## 2023-02-27 NOTE — Telephone Encounter (Signed)
Thanks for the update Mindy. 

## 2023-02-27 NOTE — Telephone Encounter (Signed)
Spoke with [facility] regarding IBD referral. Was advised they did not receive referral faxed on 6/8 but he has a previous referral on file to the [facility] IBD clinic that is still valid so they can just schedule an appt. I did make the scheduler aware of why appt was being scheduled and refaxed medical records to [facility] GI fax #. They will be sending pt appt detail information regarding appt. He has been scheduled 1st available with any provider and  with Dr. Secundino Ginger 8/1.   Tried to call patient to make aware of appt, no answer and no voicemail.    FYI Dr. Levon Hedger.

## 2023-02-28 ENCOUNTER — Encounter (HOSPITAL_COMMUNITY)
Admission: RE | Admit: 2023-02-28 | Discharge: 2023-02-28 | Disposition: A | Discharge status: Home / Self Care | Payer: Medicare Other | Source: Ambulatory Visit | Attending: Gastroenterology | Admitting: Gastroenterology

## 2023-02-28 VITALS — BP 131/79 | HR 66 | Temp 98.2°F | Resp 22

## 2023-02-28 DIAGNOSIS — Z8673 Personal history of transient ischemic attack (TIA), and cerebral infarction without residual deficits: Secondary | ICD-10-CM | POA: Diagnosis not present

## 2023-02-28 DIAGNOSIS — K51019 Ulcerative (chronic) pancolitis with unspecified complications: Secondary | ICD-10-CM | POA: Diagnosis not present

## 2023-02-28 DIAGNOSIS — Z7952 Long term (current) use of systemic steroids: Secondary | ICD-10-CM | POA: Diagnosis not present

## 2023-02-28 DIAGNOSIS — Z7982 Long term (current) use of aspirin: Secondary | ICD-10-CM | POA: Diagnosis not present

## 2023-02-28 DIAGNOSIS — T451X5D Adverse effect of antineoplastic and immunosuppressive drugs, subsequent encounter: Secondary | ICD-10-CM | POA: Diagnosis not present

## 2023-02-28 DIAGNOSIS — Z791 Long term (current) use of non-steroidal anti-inflammatories (NSAID): Secondary | ICD-10-CM | POA: Diagnosis not present

## 2023-02-28 DIAGNOSIS — G7000 Myasthenia gravis without (acute) exacerbation: Secondary | ICD-10-CM | POA: Diagnosis not present

## 2023-02-28 DIAGNOSIS — Z79620 Long term (current) use of immunosuppressive biologic: Secondary | ICD-10-CM | POA: Diagnosis not present

## 2023-02-28 DIAGNOSIS — C50412 Malignant neoplasm of upper-outer quadrant of left female breast: Secondary | ICD-10-CM | POA: Diagnosis not present

## 2023-02-28 DIAGNOSIS — Z1321 Encounter for screening for nutritional disorder: Secondary | ICD-10-CM | POA: Diagnosis not present

## 2023-02-28 DIAGNOSIS — D6481 Anemia due to antineoplastic chemotherapy: Secondary | ICD-10-CM | POA: Diagnosis not present

## 2023-02-28 DIAGNOSIS — Z79899 Other long term (current) drug therapy: Secondary | ICD-10-CM | POA: Diagnosis not present

## 2023-02-28 DIAGNOSIS — Z170 Estrogen receptor positive status [ER+]: Secondary | ICD-10-CM | POA: Diagnosis not present

## 2023-02-28 DIAGNOSIS — R5383 Other fatigue: Secondary | ICD-10-CM | POA: Diagnosis not present

## 2023-02-28 DIAGNOSIS — G620 Drug-induced polyneuropathy: Secondary | ICD-10-CM | POA: Diagnosis not present

## 2023-02-28 DIAGNOSIS — Z79633 Long term (current) use of mitotic inhibitor: Secondary | ICD-10-CM | POA: Diagnosis not present

## 2023-02-28 MED ORDER — VEDOLIZUMAB 300 MG IV SOLR
300.0000 mg | INTRAVENOUS | Status: DC
  Administered 2023-02-28: 300 mg via INTRAVENOUS
  Filled 2023-02-28: qty 5

## 2023-02-28 NOTE — Progress Notes (Signed)
Diagnosis: Crohn's Disease  Provider:  Dolores Frame MD  Procedure: Infusion  IV Type: Peripheral, IV Location: L Forearm  Entyvio (Vedolizumab), Dose: 300 mg  Infusion Start Time: 1433  Infusion Stop Time: 1504  Post Infusion IV Care: Patient declined observation and Peripheral IV Discontinued  Discharge: Condition: Good, Destination: Home . AVS Provided  Performed by:  Wyvonne Lenz, RN

## 2023-02-28 NOTE — Addendum Note (Signed)
Encounter addended by: Wyvonne Lenz, RN on: 02/28/2023 3:31 PM  Actions taken: Therapy plan modified

## 2023-03-01 ENCOUNTER — Other Ambulatory Visit (INDEPENDENT_AMBULATORY_CARE_PROVIDER_SITE_OTHER): Payer: Self-pay | Admitting: *Deleted

## 2023-03-01 DIAGNOSIS — K51019 Ulcerative (chronic) pancolitis with unspecified complications: Secondary | ICD-10-CM

## 2023-03-01 LAB — CBC WITH DIFFERENTIAL/PLATELET
Basophils Absolute: 0 10*3/uL (ref 0.0–0.2)
Basos: 0 %
EOS (ABSOLUTE): 0.6 10*3/uL — ABNORMAL HIGH (ref 0.0–0.4)
Eos: 6 %
Hematocrit: 40.8 % (ref 37.5–51.0)
Hemoglobin: 13.1 g/dL (ref 13.0–17.7)
Immature Grans (Abs): 0 10*3/uL (ref 0.0–0.1)
Immature Granulocytes: 0 %
Lymphocytes Absolute: 2.5 10*3/uL (ref 0.7–3.1)
Lymphs: 26 %
MCH: 29.1 pg (ref 26.6–33.0)
MCHC: 32.1 g/dL (ref 31.5–35.7)
MCV: 91 fL (ref 79–97)
Monocytes Absolute: 0.7 10*3/uL (ref 0.1–0.9)
Monocytes: 7 %
Neutrophils Absolute: 5.8 10*3/uL (ref 1.4–7.0)
Neutrophils: 61 %
Platelets: 314 10*3/uL (ref 150–450)
RBC: 4.5 x10E6/uL (ref 4.14–5.80)
RDW: 13.3 % (ref 11.6–15.4)
WBC: 9.5 10*3/uL (ref 3.4–10.8)

## 2023-03-01 LAB — COMPREHENSIVE METABOLIC PANEL
ALT: 85 IU/L — ABNORMAL HIGH (ref 0–44)
AST: 70 IU/L — ABNORMAL HIGH (ref 0–40)
Albumin: 3.6 g/dL — ABNORMAL LOW (ref 3.9–4.9)
Alkaline Phosphatase: 625 IU/L — ABNORMAL HIGH (ref 44–121)
BUN/Creatinine Ratio: 11 (ref 10–24)
BUN: 11 mg/dL (ref 8–27)
Bilirubin Total: 1.2 mg/dL (ref 0.0–1.2)
CO2: 23 mmol/L (ref 20–29)
Calcium: 9.1 mg/dL (ref 8.6–10.2)
Chloride: 109 mmol/L — ABNORMAL HIGH (ref 96–106)
Creatinine, Ser: 1 mg/dL (ref 0.76–1.27)
Globulin, Total: 3.2 g/dL (ref 1.5–4.5)
Glucose: 90 mg/dL (ref 70–99)
Potassium: 4.3 mmol/L (ref 3.5–5.2)
Sodium: 144 mmol/L (ref 134–144)
Total Protein: 6.8 g/dL (ref 6.0–8.5)
eGFR: 83 mL/min/{1.73_m2} (ref >59)

## 2023-03-01 LAB — VITAMIN D 25 HYDROXY (VIT D DEFICIENCY, FRACTURES): Vit D, 25-Hydroxy: 15.9 ng/mL — ABNORMAL LOW (ref 30.0–100.0)

## 2023-03-01 LAB — VITAMIN B12: Vitamin B-12: >2000 pg/mL — ABNORMAL HIGH (ref 232–1245)

## 2023-03-01 LAB — C-REACTIVE PROTEIN: CRP: 36 mg/L — ABNORMAL HIGH (ref 0–10)

## 2023-03-05 LAB — CALPROTECTIN, FECAL: Calprotectin, Fecal: 667 ug/g — ABNORMAL HIGH (ref 0–120)

## 2023-03-06 NOTE — Progress Notes (Signed)
This was sent the week before I went on vacation

## 2023-03-07 ENCOUNTER — Other Ambulatory Visit (INDEPENDENT_AMBULATORY_CARE_PROVIDER_SITE_OTHER): Payer: Self-pay | Admitting: Gastroenterology

## 2023-03-07 DIAGNOSIS — R7989 Other specified abnormal findings of blood chemistry: Secondary | ICD-10-CM

## 2023-03-07 DIAGNOSIS — R748 Abnormal levels of other serum enzymes: Secondary | ICD-10-CM

## 2023-03-07 DIAGNOSIS — E559 Vitamin D deficiency, unspecified: Secondary | ICD-10-CM

## 2023-03-07 DIAGNOSIS — K8301 Primary sclerosing cholangitis: Secondary | ICD-10-CM

## 2023-03-08 ENCOUNTER — Other Ambulatory Visit (INDEPENDENT_AMBULATORY_CARE_PROVIDER_SITE_OTHER): Payer: Self-pay | Admitting: Gastroenterology

## 2023-03-09 ENCOUNTER — Ambulatory Visit (HOSPITAL_COMMUNITY)
Admission: RE | Admit: 2023-03-09 | Discharge: 2023-03-09 | Disposition: A | Discharge status: Home / Self Care | Payer: Medicare Other | Source: Ambulatory Visit | Attending: Gastroenterology | Admitting: Gastroenterology

## 2023-03-09 ENCOUNTER — Other Ambulatory Visit (INDEPENDENT_AMBULATORY_CARE_PROVIDER_SITE_OTHER): Payer: Self-pay | Admitting: Gastroenterology

## 2023-03-09 DIAGNOSIS — R7989 Other specified abnormal findings of blood chemistry: Secondary | ICD-10-CM | POA: Diagnosis not present

## 2023-03-09 DIAGNOSIS — R748 Abnormal levels of other serum enzymes: Secondary | ICD-10-CM

## 2023-03-09 DIAGNOSIS — K8301 Primary sclerosing cholangitis: Secondary | ICD-10-CM | POA: Diagnosis not present

## 2023-03-09 DIAGNOSIS — N261 Atrophy of kidney (terminal): Secondary | ICD-10-CM | POA: Diagnosis not present

## 2023-03-09 DIAGNOSIS — N200 Calculus of kidney: Secondary | ICD-10-CM | POA: Diagnosis not present

## 2023-03-09 DIAGNOSIS — R935 Abnormal findings on diagnostic imaging of other abdominal regions, including retroperitoneum: Secondary | ICD-10-CM | POA: Diagnosis not present

## 2023-03-09 DIAGNOSIS — N202 Calculus of kidney with calculus of ureter: Secondary | ICD-10-CM | POA: Diagnosis not present

## 2023-03-09 MED ORDER — GADOBUTROL 1 MMOL/ML IV SOLN
10.0000 mL | Freq: Once | INTRAVENOUS | Status: AC | PRN
  Administered 2023-03-09: 10 mL via INTRAVENOUS

## 2023-03-13 ENCOUNTER — Telehealth (INDEPENDENT_AMBULATORY_CARE_PROVIDER_SITE_OTHER): Payer: Self-pay | Admitting: *Deleted

## 2023-03-13 NOTE — Telephone Encounter (Signed)
[facility] radiology called with stat results on MRI abdomen. Results in epic for review

## 2023-03-17 DIAGNOSIS — R748 Abnormal levels of other serum enzymes: Secondary | ICD-10-CM | POA: Diagnosis not present

## 2023-03-17 DIAGNOSIS — K8301 Primary sclerosing cholangitis: Secondary | ICD-10-CM | POA: Diagnosis not present

## 2023-03-17 DIAGNOSIS — K838 Other specified diseases of biliary tract: Secondary | ICD-10-CM | POA: Diagnosis not present

## 2023-03-17 DIAGNOSIS — R7989 Other specified abnormal findings of blood chemistry: Secondary | ICD-10-CM | POA: Diagnosis not present

## 2023-03-18 LAB — CANCER ANTIGEN 19-9: CA 19-9: 60 U/mL — ABNORMAL HIGH (ref 0–35)

## 2023-03-21 DIAGNOSIS — K5090 Crohn's disease, unspecified, without complications: Secondary | ICD-10-CM | POA: Diagnosis not present

## 2023-03-21 DIAGNOSIS — Z87442 Personal history of urinary calculi: Secondary | ICD-10-CM | POA: Diagnosis not present

## 2023-03-21 DIAGNOSIS — R319 Hematuria, unspecified: Secondary | ICD-10-CM | POA: Diagnosis not present

## 2023-03-21 DIAGNOSIS — N3001 Acute cystitis with hematuria: Secondary | ICD-10-CM | POA: Diagnosis not present

## 2023-03-21 DIAGNOSIS — K8301 Primary sclerosing cholangitis: Secondary | ICD-10-CM | POA: Diagnosis not present

## 2023-03-24 ENCOUNTER — Emergency Department (HOSPITAL_COMMUNITY): Payer: Medicare Other

## 2023-03-24 ENCOUNTER — Telehealth (INDEPENDENT_AMBULATORY_CARE_PROVIDER_SITE_OTHER): Payer: Self-pay | Admitting: Gastroenterology

## 2023-03-24 ENCOUNTER — Other Ambulatory Visit: Payer: Self-pay

## 2023-03-24 ENCOUNTER — Inpatient Hospital Stay (HOSPITAL_COMMUNITY)
Admission: EM | Admit: 2023-03-24 | Discharge: 2023-03-28 | DRG: 445 | Disposition: A | Discharge status: Home / Self Care | Payer: Medicare Other | Attending: Internal Medicine | Admitting: Internal Medicine

## 2023-03-24 ENCOUNTER — Encounter (HOSPITAL_COMMUNITY): Payer: Self-pay

## 2023-03-24 DIAGNOSIS — N200 Calculus of kidney: Secondary | ICD-10-CM | POA: Diagnosis not present

## 2023-03-24 DIAGNOSIS — K759 Inflammatory liver disease, unspecified: Secondary | ICD-10-CM | POA: Diagnosis not present

## 2023-03-24 DIAGNOSIS — K5190 Ulcerative colitis, unspecified, without complications: Secondary | ICD-10-CM | POA: Diagnosis present

## 2023-03-24 DIAGNOSIS — I517 Cardiomegaly: Secondary | ICD-10-CM | POA: Diagnosis not present

## 2023-03-24 DIAGNOSIS — Z6837 Body mass index (BMI) 37.0-37.9, adult: Secondary | ICD-10-CM

## 2023-03-24 DIAGNOSIS — Z800 Family history of malignant neoplasm of digestive organs: Secondary | ICD-10-CM | POA: Diagnosis not present

## 2023-03-24 DIAGNOSIS — R17 Unspecified jaundice: Secondary | ICD-10-CM | POA: Diagnosis not present

## 2023-03-24 DIAGNOSIS — K5100 Ulcerative (chronic) pancolitis without complications: Secondary | ICD-10-CM | POA: Diagnosis not present

## 2023-03-24 DIAGNOSIS — D638 Anemia in other chronic diseases classified elsewhere: Secondary | ICD-10-CM | POA: Diagnosis not present

## 2023-03-24 DIAGNOSIS — E669 Obesity, unspecified: Secondary | ICD-10-CM | POA: Diagnosis present

## 2023-03-24 DIAGNOSIS — Z87442 Personal history of urinary calculi: Secondary | ICD-10-CM | POA: Diagnosis not present

## 2023-03-24 DIAGNOSIS — D649 Anemia, unspecified: Secondary | ICD-10-CM | POA: Diagnosis not present

## 2023-03-24 DIAGNOSIS — K51019 Ulcerative (chronic) pancolitis with unspecified complications: Secondary | ICD-10-CM | POA: Diagnosis not present

## 2023-03-24 DIAGNOSIS — Z8551 Personal history of malignant neoplasm of bladder: Secondary | ICD-10-CM

## 2023-03-24 DIAGNOSIS — E876 Hypokalemia: Secondary | ICD-10-CM | POA: Diagnosis present

## 2023-03-24 DIAGNOSIS — Z903 Acquired absence of stomach [part of]: Secondary | ICD-10-CM

## 2023-03-24 DIAGNOSIS — D689 Coagulation defect, unspecified: Secondary | ICD-10-CM | POA: Diagnosis not present

## 2023-03-24 DIAGNOSIS — R1011 Right upper quadrant pain: Secondary | ICD-10-CM | POA: Diagnosis not present

## 2023-03-24 DIAGNOSIS — R7989 Other specified abnormal findings of blood chemistry: Secondary | ICD-10-CM

## 2023-03-24 DIAGNOSIS — N186 End stage renal disease: Secondary | ICD-10-CM | POA: Diagnosis not present

## 2023-03-24 DIAGNOSIS — K831 Obstruction of bile duct: Principal | ICD-10-CM | POA: Diagnosis present

## 2023-03-24 DIAGNOSIS — N390 Urinary tract infection, site not specified: Secondary | ICD-10-CM

## 2023-03-24 DIAGNOSIS — N182 Chronic kidney disease, stage 2 (mild): Secondary | ICD-10-CM | POA: Diagnosis not present

## 2023-03-24 DIAGNOSIS — Z79899 Other long term (current) drug therapy: Secondary | ICD-10-CM

## 2023-03-24 DIAGNOSIS — K7210 Chronic hepatic failure without coma: Secondary | ICD-10-CM | POA: Diagnosis not present

## 2023-03-24 DIAGNOSIS — N281 Cyst of kidney, acquired: Secondary | ICD-10-CM | POA: Diagnosis not present

## 2023-03-24 DIAGNOSIS — Z8711 Personal history of peptic ulcer disease: Secondary | ICD-10-CM

## 2023-03-24 DIAGNOSIS — N189 Chronic kidney disease, unspecified: Secondary | ICD-10-CM

## 2023-03-24 DIAGNOSIS — R9431 Abnormal electrocardiogram [ECG] [EKG]: Secondary | ICD-10-CM | POA: Diagnosis not present

## 2023-03-24 LAB — CBC
HCT: 37.5 % — ABNORMAL LOW (ref 39.0–52.0)
Hemoglobin: 11.8 g/dL — ABNORMAL LOW (ref 13.0–17.0)
MCH: 29.2 pg (ref 26.0–34.0)
MCHC: 31.5 g/dL (ref 30.0–36.0)
MCV: 92.8 fL (ref 80.0–100.0)
Platelets: 283 10*3/uL (ref 150–400)
RBC: 4.04 MIL/uL — ABNORMAL LOW (ref 4.22–5.81)
RDW: 16.8 % — ABNORMAL HIGH (ref 11.5–15.5)
WBC: 10.6 10*3/uL — ABNORMAL HIGH (ref 4.0–10.5)
nRBC: 0 % (ref 0.0–0.2)

## 2023-03-24 LAB — URINALYSIS, ROUTINE W REFLEX MICROSCOPIC
Glucose, UA: NEGATIVE mg/dL
Ketones, ur: NEGATIVE mg/dL
Leukocytes,Ua: NEGATIVE
Nitrite: NEGATIVE
Protein, ur: 30 mg/dL — AB
RBC / HPF: >50 RBC/hpf (ref 0–5)
Specific Gravity, Urine: 1.014 (ref 1.005–1.030)
pH: 5 (ref 5.0–8.0)

## 2023-03-24 LAB — PROTIME-INR
INR: 1.5 — ABNORMAL HIGH (ref 0.8–1.2)
Prothrombin Time: 17.9 seconds — ABNORMAL HIGH (ref 11.4–15.2)

## 2023-03-24 LAB — COMPREHENSIVE METABOLIC PANEL
ALT: 127 U/L — ABNORMAL HIGH (ref 0–44)
AST: 178 U/L — ABNORMAL HIGH (ref 15–41)
Albumin: 2.2 g/dL — ABNORMAL LOW (ref 3.5–5.0)
Alkaline Phosphatase: 651 U/L — ABNORMAL HIGH (ref 38–126)
Anion gap: 14 (ref 5–15)
BUN: 8 mg/dL (ref 8–23)
CO2: 21 mmol/L — ABNORMAL LOW (ref 22–32)
Calcium: 8.7 mg/dL — ABNORMAL LOW (ref 8.9–10.3)
Chloride: 102 mmol/L (ref 98–111)
Creatinine, Ser: 1.31 mg/dL — ABNORMAL HIGH (ref 0.61–1.24)
GFR, Estimated: >60 mL/min (ref >60)
Glucose, Bld: 89 mg/dL (ref 70–99)
Potassium: 3.9 mmol/L (ref 3.5–5.1)
Sodium: 137 mmol/L (ref 135–145)
Total Bilirubin: 14.1 mg/dL — ABNORMAL HIGH (ref 0.3–1.2)
Total Protein: 6.9 g/dL (ref 6.5–8.1)

## 2023-03-24 LAB — BILIRUBIN, FRACTIONATED(TOT/DIR/INDIR)
Bilirubin, Direct: 8.3 mg/dL — ABNORMAL HIGH (ref 0.0–0.2)
Indirect Bilirubin: 5.1 mg/dL — ABNORMAL HIGH (ref 0.3–0.9)
Total Bilirubin: 13.4 mg/dL — ABNORMAL HIGH (ref 0.3–1.2)

## 2023-03-24 LAB — LIPASE, BLOOD: Lipase: 21 U/L (ref 11–51)

## 2023-03-24 MED ORDER — LACTATED RINGERS IV BOLUS
1000.0000 mL | Freq: Once | INTRAVENOUS | Status: AC
  Administered 2023-03-24: 1000 mL via INTRAVENOUS

## 2023-03-24 MED ORDER — SODIUM CHLORIDE 0.9% FLUSH
3.0000 mL | Freq: Two times a day (BID) | INTRAVENOUS | Status: DC
  Administered 2023-03-25 – 2023-03-28 (×6): 3 mL via INTRAVENOUS

## 2023-03-24 MED ORDER — LACTATED RINGERS IV SOLN: INTRAVENOUS | Status: AC

## 2023-03-24 MED ORDER — CIPROFLOXACIN IN D5W 400 MG/200ML IV SOLN
400.0000 mg | Freq: Two times a day (BID) | INTRAVENOUS | Status: AC
  Administered 2023-03-25 (×3): 400 mg via INTRAVENOUS
  Filled 2023-03-24 (×3): qty 200

## 2023-03-24 MED ORDER — PHYTONADIONE 5 MG PO TABS
10.0000 mg | ORAL_TABLET | Freq: Once | ORAL | Status: AC
  Administered 2023-03-25: 10 mg via ORAL
  Filled 2023-03-24: qty 2

## 2023-03-24 NOTE — H&P (Addendum)
History and Physical    Patient: Alex Thomas OZD:[id number] DOB: [birth year] DOA: 03/24/2023 DOS: the patient was seen and examined on 03/24/2023 PCP: Benita Stabile, MD  Patient coming from:  Patient was advised to come to the ER by his outpatient doctor  Chief Complaint:  Chief Complaint  Patient presents with   Jaundice   HPI: Alex Thomas is a 67 y.o. male with medical history significant of Crohn's disease.  Patient reports chronic symptoms of 2-5 watery bowel movements without any pain or fever or vomiting.  Patient receives Entyvio infusions every 4 weeks.  For the last 2 years.  Patient was in his usual state of health till approximately 4 weeks ago when an acquaintance noticed that the patient was having yellowing of eyes.  Since then patient has had slight progression of his eye yellowing as well as noticing darkening of his urine for the last couple of weeks.  Patient was evaluated by PCP 3 days ago.  Patient denies having any fever, suprapubic pain, dysuria at the time.  However urine culture culture/analysis revealed patient had a UTI.  Patient was started on ciprofloxacin at the time.  Also patient's blood work was drawn routinely.  Patient was called by outpatient doctor today to come to the hospital because of having high "levels "in the blood.  Patient has noted progressive yellowing of skin over the last 4 days.  Otherwise no change in color of stools, no vomiting no abdominal pain no loss of consciousness no tremors no presyncope.  Medical evaluation is sought.  Patient's ER course has been notable for vital stability Review of Systems: As mentioned in the history of present illness. All other systems reviewed and are negative. Past Medical History:  Diagnosis Date   Bladder cancer [facility])    age 25   History of kidney stones    Multiple gastric ulcers    Past Surgical History:  Procedure Laterality Date   APPENDECTOMY     BIOPSY  11/23/2021   Procedure: BIOPSY;  Surgeon:  Dolores Frame, MD;  [facility];  Service: Gastroenterology;;   BIOPSY  09/23/2022   Procedure: BIOPSY;  Surgeon: Dolores Frame, MD;  [facility];  Service: Gastroenterology;;   COLONOSCOPY WITH PROPOFOL N/A 11/23/2021   Procedure: COLONOSCOPY WITH PROPOFOL;  Surgeon: Dolores Frame, MD;  [facility];  Service: Gastroenterology;  Laterality: N/A;  805 ASA 1   COLONOSCOPY WITH PROPOFOL N/A 09/23/2022   Procedure: COLONOSCOPY WITH PROPOFOL;  Surgeon: Dolores Frame, MD;  [facility];  Service: Gastroenterology;  Laterality: N/A;  1:00pm, asa 2, pt knows to arrive at 10:00   GASTRECTOMY     HERNIA REPAIR     KIDNEY STONE SURGERY     POLYPECTOMY  11/23/2021   Procedure: POLYPECTOMY;  Surgeon: Dolores Frame, MD;  [facility];  Service: Gastroenterology;;   Susa Day  09/23/2022   Procedure: Susa Day;  Surgeon: Marguerita Merles, Reuel Boom, MD;  [facility];  Service: Gastroenterology;;   Social History:  reports that he has never smoked. He has been exposed to tobacco smoke. He has never used smokeless tobacco. He reports that he does not currently use alcohol. He reports that he does not currently use drugs.  No Known Allergies  Family History  Problem Relation Age of Onset   Colon cancer Mother     Prior to Admission medications   Medication Sig Start Date End Date  Taking? Authorizing Provider  Cyanocobalamin (VITAMIN B-12) 1000 MCG SUBL Place 2 tablets (2,000 mcg total) under the tongue daily. 03/24/22   Dolores Frame, MD  folic acid (FOLVITE) 1 MG tablet Take 1 tablet (1 mg total) by mouth daily. 03/24/22   Dolores Frame, MD  vedolizumab (ENTYVIO) 300 MG injection Inject 300 mg into the vein every 8 (eight) weeks.    [provider]    Physical Exam: Vitals:   03/24/23 1645 03/24/23 1700 03/24/23 1715 03/24/23 1942  BP:  117/88 110/68 106/75 131/87  Pulse: 71 62 68 77  Resp: 16 12 15 18   Temp:    98.3 F (36.8 C)  TempSrc:    Oral  SpO2: 100% 100% 97% 99%   General: Icterus is apparent.  No distress Respiratory exam: Bilateral intravesicular Cardiovascular exam S1-S2 normal Abdomen soft nontender Extremities warm without edema Alert awake oriented x 3 no focal motor deficits slightly obese. Data Reviewed:  Labs on Admission:  Results for orders placed or performed during the hospital encounter of 03/24/23 (from the past 24 hour(s))  Lipase, blood     Status: None   Collection Time: 03/24/23  2:32 PM  Result Value Ref Range   Lipase 21 11 - 51 U/L  Comprehensive metabolic panel     Status: Abnormal   Collection Time: 03/24/23  2:32 PM  Result Value Ref Range   Sodium 137 135 - 145 mmol/L   Potassium 3.9 3.5 - 5.1 mmol/L   Chloride 102 98 - 111 mmol/L   CO2 21 (L) 22 - 32 mmol/L   Glucose, Bld 89 70 - 99 mg/dL   BUN 8 8 - 23 mg/dL   Creatinine, Ser 8.29 (H) 0.61 - 1.24 mg/dL   Calcium 8.7 (L) 8.9 - 10.3 mg/dL   Total Protein 6.9 6.5 - 8.1 g/dL   Albumin 2.2 (L) 3.5 - 5.0 g/dL   AST 562 (H) 15 - 41 U/L   ALT 127 (H) 0 - 44 U/L   Alkaline Phosphatase 651 (H) 38 - 126 U/L   Total Bilirubin 14.1 (H) 0.3 - 1.2 mg/dL   GFR, Estimated >13 >08 mL/min   Anion gap 14 5 - 15  CBC     Status: Abnormal   Collection Time: 03/24/23  2:32 PM  Result Value Ref Range   WBC 10.6 (H) 4.0 - 10.5 K/uL   RBC 4.04 (L) 4.22 - 5.81 MIL/uL   Hemoglobin 11.8 (L) 13.0 - 17.0 g/dL   HCT 65.7 (L) 84.6 - 96.2 %   MCV 92.8 80.0 - 100.0 fL   MCH 29.2 26.0 - 34.0 pg   MCHC 31.5 30.0 - 36.0 g/dL   RDW 95.2 (H) 84.1 - 32.4 %   Platelets 283 150 - 400 K/uL   nRBC 0.0 0.0 - 0.2 %  Urinalysis, Routine w reflex microscopic -Urine, Clean Catch     Status: Abnormal   Collection Time: 03/24/23  2:32 PM  Result Value Ref Range   Color, Urine AMBER (A) YELLOW   APPearance HAZY (A) CLEAR   Specific Gravity, Urine 1.014  1.005 - 1.030   pH 5.0 5.0 - 8.0   Glucose, UA NEGATIVE NEGATIVE mg/dL   Hgb urine dipstick LARGE (A) NEGATIVE   Bilirubin Urine MODERATE (A) NEGATIVE   Ketones, ur NEGATIVE NEGATIVE mg/dL   Protein, ur 30 (A) NEGATIVE mg/dL   Nitrite NEGATIVE NEGATIVE   Leukocytes,Ua NEGATIVE NEGATIVE   RBC / HPF >50 0 - 5  RBC/hpf   WBC, UA 0-5 0 - 5 WBC/hpf   Bacteria, UA RARE (A) NONE SEEN   Squamous Epithelial / HPF 0-5 0 - 5 /HPF   Mucus PRESENT    Hyaline Casts, UA PRESENT    Granular Casts, UA PRESENT   Bilirubin, fractionated(tot/dir/indir)     Status: Abnormal   Collection Time: 03/24/23  5:47 PM  Result Value Ref Range   Total Bilirubin 13.4 (H) 0.3 - 1.2 mg/dL   Bilirubin, Direct 8.3 (H) 0.0 - 0.2 mg/dL   Indirect Bilirubin 5.1 (H) 0.3 - 0.9 mg/dL  Protime-INR     Status: Abnormal   Collection Time: 03/24/23  5:47 PM  Result Value Ref Range   Prothrombin Time 17.9 (H) 11.4 - 15.2 seconds   INR 1.5 (H) 0.8 - 1.2   Basic Metabolic Panel: Recent Labs  Lab 03/24/23 1432  NA 137  K 3.9  CL 102  CO2 21*  GLUCOSE 89  BUN 8  CREATININE 1.31*  CALCIUM 8.7*   Liver Function Tests: Recent Labs  Lab 03/24/23 1432 03/24/23 1747  AST 178*  --   ALT 127*  --   ALKPHOS 651*  --   BILITOT 14.1* 13.4*  PROT 6.9  --   ALBUMIN 2.2*  --    Recent Labs  Lab 03/24/23 1432  LIPASE 21   No results for input(s): "AMMONIA" in the last 168 hours. CBC: Recent Labs  Lab 03/24/23 1432  WBC 10.6*  HGB 11.8*  HCT 37.5*  MCV 92.8  PLT 283   Cardiac Enzymes: No results for input(s): "CKTOTAL", "CKMB", "CKMBINDEX", "TROPONINIHS" in the last 168 hours.  BNP (last 3 results) No results for input(s): "PROBNP" in the last 8760 hours. CBG: No results for input(s): "GLUCAP" in the last 168 hours.  Radiological Exams on Admission:  US Abdomen Limited RUQ (LIVER/GB)  Result Date: 03/24/2023 CLINICAL DATA:  Right upper quadrant abdominal pain EXAM: ULTRASOUND ABDOMEN LIMITED RIGHT UPPER  QUADRANT COMPARISON:  Previous studies including the MRI none on 03/09/2023 FINDINGS: Gallbladder: No gallstones or wall thickening visualized. No sonographic Murphy sign noted by sonographer. Common bile duct: Diameter: 3.3 mm Liver: Left lobe of liver is not visualized consistent with history of atrophy. There is increased echogenicity in liver. No focal abnormalities are seen in visualized portions of liver. Portal vein is patent on color Doppler imaging with normal direction of blood flow towards the liver. Other: None. IMPRESSION: No acute findings are seen in right upper quadrant abdomen sonogram. Electronically Signed   By: Ernie Avena M.D.   On: 03/24/2023 17:40    EKG: Routine EKG ordered   Assessment and Plan: Jaundice This is strongly in a pattern of cholestatic injury.  With associated slight coagulopathy.  I will give the patient empiric vitamin K.  And trend although patient's ultrasound is nonactionable of the right upper quadrant.  I agree with pursuing this further with an MRI as ordered.  Case has been discussed with Dr. Neomia Dear of the GI service by ER attending.  Slight leukocytosis is noted, I will continue with ciprofloxacin for UTI as above.  No plan to escalate antibiotic therapy at this time given no fever and no right upper quadrant pain.  Antimitochondrial antibody.  At this time I will not order acute hepatitis panel  UTI (urinary tract infection) This was diagnosed on basis of urine studies approximately 3 days ago.  Patient does not report at this time any symptoms of dysuria or suprapubic pain.  I intend to continue patient's ciprofloxacin at this time  CKD (chronic kidney disease) Seems to be chronic, creatinine fluctuating between 1 and 1.3.  Monitoring clinically at this time  Ulcerative colitis (HCC) Diagnosed over 2 years ago, maintained on Entyvio infusion.  At this time patient reports chronic symptoms of occasional loose bowel movements.  I will wait for  GI input with regards to restarting patient antibiotic infusion.  At this time no marked failure is noted.   Please review med rec in AM as well    Advance Care Planning:   Code Status: Not on file full code  Consults: GI Dr. Tomasa Rand has been engaged, look forward to have his his evaluation in the morning  Family Communication: As per patient  Severity of Illness: The appropriate patient status for this patient is INPATIENT. Inpatient status is judged to be reasonable and necessary in order to provide the required intensity of service to ensure the patient's safety. The patient's presenting symptoms, physical exam findings, and initial radiographic and laboratory data in the context of their chronic comorbidities is felt to place them at high risk for further clinical deterioration. Furthermore, it is not anticipated that the patient will be medically stable for discharge from the hospital within 2 midnights of admission.   * I certify that at the point of admission it is my clinical judgment that the patient will require inpatient hospital care spanning beyond 2 midnights from the point of admission due to high intensity of service, high risk for further deterioration and high frequency of surveillance required.*  Author: Nolberto Hanlon, MD 03/24/2023 9:12 PM  For on call review [email or web address].

## 2023-03-24 NOTE — Assessment & Plan Note (Signed)
Seems to be chronic, creatinine fluctuating between 1 and 1.3.  Monitoring clinically at this time

## 2023-03-24 NOTE — ED Provider Notes (Signed)
[place] EMERGENCY DEPARTMENT AT [facility] Provider Note   CSN: [id number] Arrival date & time: 03/24/23  1401     History Chief Complaint  Patient presents with   Jaundice    HPI Alex Thomas is a 67 y.o. male presenting for jaundice over the past few days. 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 He states that over the last 3 days he has had noticeable development of jaundice.  States he did not have any scleral icterus or skin yellowing prior to these past 4 days.  Notably, he says he saw his PCP recently was seen by one of the assistants and started on lactobacillus and ciprofloxacin for suspected UTI.  Has taken Cipro in the past without development of any symptoms.  He denies fevers chills nausea vomiting shortness of breath.  Denies any abdominal pain. Does state he is having malaise fatigue and feeling like he is moving and thinking slower than normal. Patient follow-up with [facility] gastroenterology as well as [facility] gastroenterology. Patient's recorded medical, surgical, social, medication list and allergies were reviewed in the Snapshot window as part of the initial history.   Review of Systems   Review of Systems  Constitutional:  Negative for chills and fever.  HENT:  Negative for ear pain and sore throat.   Eyes:  Negative for pain and visual disturbance.  Respiratory:  Negative for cough and shortness of breath.   Cardiovascular:  Negative for chest pain and palpitations.  Gastrointestinal:  Negative for abdominal pain and vomiting.  Genitourinary:  Negative for dysuria and hematuria.  Musculoskeletal:  Negative for arthralgias and back pain.  Skin:  Positive for color change. Negative for rash.  Neurological:  Negative for seizures and syncope.  All other  systems reviewed and are negative.   Physical Exam Updated Vital Signs BP 131/87 (BP Location: Right Arm)   Pulse 77   Temp 98.3 F (36.8 C) (Oral)   Resp 18   SpO2 99%  Physical Exam Vitals and nursing note reviewed.  Constitutional:      General: He is not in acute distress.    Appearance: He is well-developed.  HENT:     Head: Normocephalic and atraumatic.  Eyes:     Conjunctiva/sclera: Conjunctivae normal.  Cardiovascular:     Rate and Rhythm: Normal rate and regular rhythm.     Heart sounds: No murmur heard. Pulmonary:     Effort: Pulmonary effort is normal. No respiratory distress.     Breath sounds: Normal breath sounds.  Abdominal:     Palpations: Abdomen is soft.     Tenderness: There is no abdominal tenderness.  Musculoskeletal:        General: No swelling.     Cervical back: Neck supple.  Skin:    General: Skin is warm and dry.     Capillary Refill: Capillary refill takes less than 2 seconds.     Coloration: Skin is jaundiced.  Neurological:     Mental Status: He is alert.  Psychiatric:        Mood and Affect: Mood normal.      ED Course/ Medical Decision Making/ A&P Clinical Course as of 03/24/23 2033  Fri Mar 24, 2023  2002 Consulted Dr. Tomasa Rand of [facility] who recommended MRCP and admission with IV hydration. [CC]  2026 Admit to [facility] [  CC]    Clinical Course User Index [CC] Glyn Ade, MD    Procedures .Critical Care  Performed by: Glyn Ade, MD Authorized by: Glyn Ade, MD   Critical care provider statement:    Critical care time (minutes):  30   Critical care was necessary to treat or prevent imminent or life-threatening deterioration of the following conditions:  Hepatic failure   Critical care was time spent personally by me on the following activities:  Development of treatment plan with patient or surrogate, discussions with consultants, evaluation of patient's response to treatment, examination of patient,  ordering and review of laboratory studies, ordering and review of radiographic studies, ordering and performing treatments and interventions, pulse oximetry, re-evaluation of patient's condition and review of old charts   Care discussed with: admitting provider      Medications Ordered in ED Medications  lactated ringers bolus 1,000 mL (has no administration in time range)  lactated ringers infusion (has no administration in time range)    Medical Decision Making:    Alex Thomas is a 67 y.o. male who presented to the ED today with the development of jaundice detailed above.     Additional history discussed with patient's family/caregivers.  External chart has been reviewed including [facility] record for his GI appointments. Patient placed on continuous vitals and telemetry monitoring while in ED which was reviewed periodically.   Complete initial physical exam performed, notably the patient  was noticeably jaundiced but no acute distress.  GCS 15.  No Abdominal tenderness at all on exam at this time.      Reviewed and confirmed nursing documentation for past medical history, family history, social history.    Initial Assessment:   With the patient's presentation of acute development of jaundice, most likely diagnosis is flare of his primary sclerosing cholangitis versus medication reaction with the of recent fluoroquinolone. Other diagnoses were considered including (but not limited to) cholangitis, cholecystitis, choledocholithiasis, acute hepatitis, toxic hepatitis. These are considered less likely due to history of present illness and physical exam findings.   This is most consistent with an acute life/limb threatening illness complicated by underlying chronic conditions.  Initial Plan:  Right upper quadrant ultrasound to evaluate for structural etiology of patient's symptoms Screening labs including CBC and Metabolic panel to evaluate for infectious or metabolic etiology of disease.   Including fractionation of his bilirubin Urinalysis with reflex culture ordered to evaluate for UTI or relevant urologic/nephrologic pathology.  CXR to evaluate for structural/infectious intrathoracic pathology.  EKG to evaluate for cardiac pathology. Objective evaluation as below reviewed with plan for close reassessment  Initial Study Results:   Laboratory  Acute bilirubin elevation from last known normal up 0.8 earlier this year to 14, diffuse LFT abnormalities as well  EKG EKG was reviewed independently. Rate, rhythm, axis, intervals all examined and without medically relevant abnormality. ST segments without concerns for elevations.    Radiology  All images reviewed independently. Agree with radiology report at this time.   US Abdomen Limited RUQ (LIVER/GB)  Result Date: 03/24/2023 CLINICAL DATA:  Right upper quadrant abdominal pain EXAM: ULTRASOUND ABDOMEN LIMITED RIGHT UPPER QUADRANT COMPARISON:  Previous studies including the MRI none on 03/09/2023 FINDINGS: Gallbladder: No gallstones or wall thickening visualized. No sonographic Murphy sign noted by sonographer. Common bile duct: Diameter: 3.3 mm Liver: Left lobe of liver is not visualized consistent with history of atrophy. There is increased echogenicity in liver. No focal abnormalities are seen in visualized portions of liver. Portal vein  is patent on color Doppler imaging with normal direction of blood flow towards the liver. Other: None. IMPRESSION: No acute findings are seen in right upper quadrant abdomen sonogram. Electronically Signed   By: Ernie Avena M.D.   On: 03/24/2023 17:40     Consults:  Case discussed with Dr. Tomasa Rand of [facility].  He recommended initiation of an MRCP to evaluate for recurrent ductal disease but drug-induced liver injury is also on the differential.  Family is supposed to be getting pictures of all the supplements that patient takes.  Reassessment and Plan:   Discussed with Dr. Samara Deist of  hospitalist.  He agrees with need for admission for further care and management. Patient arranged for admission with no further acute events.  Disposition:   Based on the above findings, I believe this patient is stable for admission.    Patient/family educated about specific findings on our evaluation and explained exact reasons for admission.  Patient/family educated about clinical situation and time was allowed to answer questions.   Admission team communicated with and agreed with need for admission. Patient admitted. Patient ready to move at this time.     Emergency Department Medication Summary:   Medications  lactated ringers bolus 1,000 mL (has no administration in time range)  lactated ringers infusion (has no administration in time range)    Clinical Impression:  1. Jaundice      Admit   Final Clinical Impression(s) / ED Diagnoses Final diagnoses:  Jaundice    Rx / DC Orders ED Discharge Orders     None         Glyn Ade, MD 03/24/23 2034

## 2023-03-24 NOTE — Assessment & Plan Note (Addendum)
This is strongly in a pattern of cholestatic injury.  With associated slight coagulopathy.  I will give the patient empiric vitamin K.  And trend although patient's ultrasound is nonactionable of the right upper quadrant.  I agree with pursuing this further with an MRI as ordered.  Case has been discussed with Dr. Neomia Dear of the GI service by ER attending.  Slight leukocytosis is noted, I will continue with ciprofloxacin for UTI as above.  No plan to escalate antibiotic therapy at this time given no fever and no right upper quadrant pain.  Antimitochondrial antibody.  At this time I will not order acute hepatitis panel

## 2023-03-24 NOTE — Assessment & Plan Note (Signed)
Diagnosed over 2 years ago, maintained on Entyvio infusion.  At this time patient reports chronic symptoms of occasional loose bowel movements.  I will wait for GI input with regards to restarting patient antibiotic infusion.  At this time no marked failure is noted.

## 2023-03-24 NOTE — ED Notes (Signed)
IV team at bedside for line insertion.

## 2023-03-24 NOTE — ED Triage Notes (Signed)
Pt reports jaundice for the past 3 days, denies any n/v or abd pain. Pt being worked up with GI for crohns. Pt recently started a probiotic.

## 2023-03-24 NOTE — Assessment & Plan Note (Signed)
This was diagnosed on basis of urine studies approximately 3 days ago.  Patient does not report at this time any symptoms of dysuria or suprapubic pain.  I intend to continue patient's ciprofloxacin at this time

## 2023-03-24 NOTE — ED Notes (Signed)
ED TO INPATIENT HANDOFF REPORT  ED Nurse Name and Phone #: [phone]  S Name/Age/Gender Alex Thomas 67 y.o. male Room/Bed: 011C/011C  Code Status   Code Status: Full Code  Home/SNF/Other Home Patient oriented to: self, place, time, and situation Is this baseline? Yes   Triage Complete: Triage complete  Chief Complaint Obstructive jaundice [K83.1]  Triage Note Pt reports jaundice for the past 3 days, denies any n/v or abd pain. Pt being worked up with GI for crohns. Pt recently started a probiotic.    Allergies No Known Allergies  Level of Care/Admitting Diagnosis ED Disposition     ED Disposition  Admit   Condition  --   Comment  Hospital Area: [facility] [[id number]]  Level of Care: Med-Surg [16]  May admit patient to [facility] or [facility] if equivalent level of care is available:: No  Covid Evaluation: Asymptomatic - no recent exposure (last 10 days) testing not required  Diagnosis: Obstructive jaundice [709956]  Admitting Physician: Nolberto Hanlon [[id number]]  Attending Physician: Nolberto Hanlon [[id number]]  Certification:: I certify this patient will need inpatient services for at least 2 midnights  Estimated Length of Stay: 3          B Medical/Surgery History Past Medical History:  Diagnosis Date   Bladder cancer (HCC)    age 48   History of kidney stones    Multiple gastric ulcers    Past Surgical History:  Procedure Laterality Date   APPENDECTOMY     BIOPSY  11/23/2021   Procedure: BIOPSY;  Surgeon: Dolores Frame, MD;  [facility];  Service: Gastroenterology;;   BIOPSY  09/23/2022   Procedure: BIOPSY;  Surgeon: Dolores Frame, MD;  [facility];  Service: Gastroenterology;;   COLONOSCOPY WITH PROPOFOL N/A 11/23/2021   Procedure: COLONOSCOPY WITH PROPOFOL;  Surgeon: Dolores Frame, MD;  [facility]: [facility];  Service: Gastroenterology;  Laterality: N/A;  805 ASA 1   COLONOSCOPY  WITH PROPOFOL N/A 09/23/2022   Procedure: COLONOSCOPY WITH PROPOFOL;  Surgeon: Dolores Frame, MD;  [facility];  Service: Gastroenterology;  Laterality: N/A;  1:00pm, asa 2, pt knows to arrive at 10:00   GASTRECTOMY     HERNIA REPAIR     KIDNEY STONE SURGERY     POLYPECTOMY  11/23/2021   Procedure: POLYPECTOMY;  Surgeon: Dolores Frame, MD;  [facility]: [facility];  Service: Gastroenterology;;   Susa Day  09/23/2022   Procedure: Susa Day;  Surgeon: Marguerita Merles, Reuel Boom, MD;  [facility];  Service: Gastroenterology;;     A IV Location/Drains/Wounds Patient Lines/Drains/Airways Status     Active Line/Drains/Airways     None            Intake/Output Last 24 hours No intake or output data in the 24 hours ending 03/24/23 2146  Labs/Imaging Results for orders placed or performed during the hospital encounter of 03/24/23 (from the past 48 hour(s))  Lipase, blood     Status: None   Collection Time: 03/24/23  2:32 PM  Result Value Ref Range   Lipase 21 11 - 51 U/L    Comment: Performed at [facility], [address] [place], [place] [address]  Comprehensive metabolic panel     Status: Abnormal   Collection Time: 03/24/23  2:32 PM  Result Value Ref Range   Sodium 137 135 - 145 mmol/L   Potassium 3.9 3.5 - 5.1 mmol/L  Comment: HEMOLYSIS AT THIS LEVEL MAY AFFECT RESULT   Chloride 102 98 - 111 mmol/L   CO2 21 (L) 22 - 32 mmol/L   Glucose, Bld 89 70 - 99 mg/dL    Comment: Glucose reference range applies only to samples taken after fasting for at least 8 hours.   BUN 8 8 - 23 mg/dL   Creatinine, Ser 1.61 (H) 0.61 - 1.24 mg/dL   Calcium 8.7 (L) 8.9 - 10.3 mg/dL   Total Protein 6.9 6.5 - 8.1 g/dL   Albumin 2.2 (L) 3.5 - 5.0 g/dL   AST 096 (H) 15 - 41 U/L    Comment: HEMOLYSIS AT THIS LEVEL MAY AFFECT RESULT   ALT 127 (H) 0 - 44 U/L    Comment: HEMOLYSIS AT THIS LEVEL MAY AFFECT RESULT   Alkaline Phosphatase 651  (H) 38 - 126 U/L   Total Bilirubin 14.1 (H) 0.3 - 1.2 mg/dL    Comment: HEMOLYSIS AT THIS LEVEL MAY AFFECT RESULT   GFR, Estimated >60 >60 mL/min    Comment: (NOTE) Calculated using the CKD-EPI Creatinine Equation (2021)    Anion gap 14 5 - 15    Comment: Performed at [facility], [address] [place], [place] [address]  CBC     Status: Abnormal   Collection Time: 03/24/23  2:32 PM  Result Value Ref Range   WBC 10.6 (H) 4.0 - 10.5 K/uL   RBC 4.04 (L) 4.22 - 5.81 MIL/uL   Hemoglobin 11.8 (L) 13.0 - 17.0 g/dL   HCT 98.1 (L) 19.1 - 47.8 %   MCV 92.8 80.0 - 100.0 fL   MCH 29.2 26.0 - 34.0 pg   MCHC 31.5 30.0 - 36.0 g/dL   RDW 29.5 (H) 62.1 - 30.8 %   Platelets 283 150 - 400 K/uL   nRBC 0.0 0.0 - 0.2 %    Comment: Performed at [facility], [address] [place], [place] [address]  Urinalysis, Routine w reflex microscopic -Urine, Clean Catch     Status: Abnormal   Collection Time: 03/24/23  2:32 PM  Result Value Ref Range   Color, Urine AMBER (A) YELLOW    Comment: BIOCHEMICALS MAY BE AFFECTED BY COLOR   APPearance HAZY (A) CLEAR   Specific Gravity, Urine 1.014 1.005 - 1.030   pH 5.0 5.0 - 8.0   Glucose, UA NEGATIVE NEGATIVE mg/dL   Hgb urine dipstick LARGE (A) NEGATIVE   Bilirubin Urine MODERATE (A) NEGATIVE   Ketones, ur NEGATIVE NEGATIVE mg/dL   Protein, ur 30 (A) NEGATIVE mg/dL   Nitrite NEGATIVE NEGATIVE   Leukocytes,Ua NEGATIVE NEGATIVE   RBC / HPF >50 0 - 5 RBC/hpf   WBC, UA 0-5 0 - 5 WBC/hpf   Bacteria, UA RARE (A) NONE SEEN   Squamous Epithelial / HPF 0-5 0 - 5 /HPF   Mucus PRESENT    Hyaline Casts, UA PRESENT    Granular Casts, UA PRESENT     Comment: Performed at [facility], [address] [place], [place] [address]  Bilirubin, fractionated(tot/dir/indir)     Status: Abnormal   Collection Time: 03/24/23  5:47 PM  Result Value Ref Range   Total Bilirubin 13.4 (H) 0.3 - 1.2 mg/dL   Bilirubin, Direct 8.3 (H) 0.0 - 0.2 mg/dL   Indirect  Bilirubin 5.1 (H) 0.3 - 0.9 mg/dL    Comment: Performed at [facility], [address], [place], [place] [address]  Protime-INR  Status: Abnormal   Collection Time: 03/24/23  5:47 PM  Result Value Ref Range   Prothrombin Time 17.9 (H) 11.4 - 15.2 seconds   INR 1.5 (H) 0.8 - 1.2    Comment: (NOTE) INR goal varies based on device and disease states. Performed at [facility], [address] [place], [place] [address]    US Abdomen Limited RUQ (LIVER/GB)  Result Date: 03/24/2023 CLINICAL DATA:  Right upper quadrant abdominal pain EXAM: ULTRASOUND ABDOMEN LIMITED RIGHT UPPER QUADRANT COMPARISON:  Previous studies including the MRI none on 03/09/2023 FINDINGS: Gallbladder: No gallstones or wall thickening visualized. No sonographic Murphy sign noted by sonographer. Common bile duct: Diameter: 3.3 mm Liver: Left lobe of liver is not visualized consistent with history of atrophy. There is increased echogenicity in liver. No focal abnormalities are seen in visualized portions of liver. Portal vein is patent on color Doppler imaging with normal direction of blood flow towards the liver. Other: None. IMPRESSION: No acute findings are seen in right upper quadrant abdomen sonogram. Electronically Signed   By: Ernie Avena M.D.   On: 03/24/2023 17:40    Pending Labs Unresulted Labs (From admission, onward)     Start     Ordered   03/25/23 0500  Protime-INR  Tomorrow morning,   R        03/24/23 2119   03/25/23 0500  APTT  Tomorrow morning,   R        03/24/23 2119   03/25/23 0500  CBC with Differential/Platelet  Tomorrow morning,   R        03/24/23 2120   03/25/23 0500  Mitochondrial antibodies  Tomorrow morning,   R        03/24/23 2121   Signed and Held  HIV Antibody (routine testing w rflx)  (HIV Antibody (Routine testing w reflex) panel)  Once,   R        Signed and Held   Signed and Held  Comprehensive metabolic panel  Tomorrow morning,   R        Signed and Held             Vitals/Pain Today's Vitals   03/24/23 1645 03/24/23 1700 03/24/23 1715 03/24/23 1942  BP: 117/88 110/68 106/75 131/87  Pulse: 71 62 68 77  Resp: 16 12 15 18   Temp:    98.3 F (36.8 C)  TempSrc:    Oral  SpO2: 100% 100% 97% 99%  PainSc:        Isolation Precautions No active isolations  Medications Medications  lactated ringers bolus 1,000 mL (has no administration in time range)  lactated ringers infusion (has no administration in time range)  phytonadione (VITAMIN K) tablet 10 mg (has no administration in time range)    Mobility walks     Focused Assessments Jaundice, denies all abdominal pain, n,v    R Recommendations: See Admitting Provider Note  Report given to:   Additional Notes:

## 2023-03-24 NOTE — Telephone Encounter (Addendum)
We received some of the labs performed by the patient's PCP on 03/22/2023 which showed a bilirubin of 11.8, alkaline phosphatase 802, AST 150, ALT 126, albumin 3.3, CBC with WBC 12.5.  These labs were checked as he was having some urinary frequency and he was being checked for UTI for which he is receiving ciprofloxacin treatment.  Patient reported he has presented some abdominal discomfort but no severe pain.  Has presented some low-grade temperature.  I am concerned about acute severe elevation of his liver enzymes, especially his total bilirubin as this was less than 2 recently.  He had an MRCP that did not show any focal stricture in his biliary tract but I advised him he should have an evaluation in the ER ASAP as it would be important to rule out any subacute obstruction in his biliary tract.  He may need to have a repeat CMP with fractionated bilirubin and MRCP performed.  I tried to reach his hepatologist at [facility] but could not get connected to him.  Patient understood and agreed.

## 2023-03-24 NOTE — Progress Notes (Signed)
New Admission Note:  Arrival Method:From ED around 2340 Mental Orientation: Alert and oriented x 4 Telemetry: None Assessment: Completed Skin: Completed, refer to flowsheets IV: Left upper arm  Pain: Denies Tubes: None Safety Measures: Safety Fall Prevention Plan was given, discussed and signed. Admission: Completed [facility] Orientation: Patient has been orientated to the room, unit and the staff. Family: None  Orders have been reviewed and implemented. Will continue to monitor the patient. Call light has been placed within reach and bed alarm has been activated.   Franky Macho, RN  Phone Number: [phone]

## 2023-03-25 ENCOUNTER — Inpatient Hospital Stay (HOSPITAL_COMMUNITY): Payer: Medicare Other

## 2023-03-25 DIAGNOSIS — R17 Unspecified jaundice: Secondary | ICD-10-CM

## 2023-03-25 DIAGNOSIS — K5190 Ulcerative colitis, unspecified, without complications: Secondary | ICD-10-CM

## 2023-03-25 DIAGNOSIS — N182 Chronic kidney disease, stage 2 (mild): Secondary | ICD-10-CM | POA: Diagnosis not present

## 2023-03-25 DIAGNOSIS — I517 Cardiomegaly: Secondary | ICD-10-CM

## 2023-03-25 DIAGNOSIS — N390 Urinary tract infection, site not specified: Secondary | ICD-10-CM

## 2023-03-25 DIAGNOSIS — D649 Anemia, unspecified: Secondary | ICD-10-CM

## 2023-03-25 DIAGNOSIS — K831 Obstruction of bile duct: Secondary | ICD-10-CM | POA: Diagnosis not present

## 2023-03-25 DIAGNOSIS — N200 Calculus of kidney: Secondary | ICD-10-CM | POA: Diagnosis not present

## 2023-03-25 DIAGNOSIS — K51019 Ulcerative (chronic) pancolitis with unspecified complications: Secondary | ICD-10-CM | POA: Diagnosis not present

## 2023-03-25 LAB — CBC WITH DIFFERENTIAL/PLATELET
Abs Immature Granulocytes: 0.07 10*3/uL (ref 0.00–0.07)
Basophils Absolute: 0 10*3/uL (ref 0.0–0.1)
Basophils Relative: 0 %
Eosinophils Absolute: 0.3 10*3/uL (ref 0.0–0.5)
Eosinophils Relative: 3 %
HCT: 31.3 % — ABNORMAL LOW (ref 39.0–52.0)
Hemoglobin: 10.6 g/dL — ABNORMAL LOW (ref 13.0–17.0)
Immature Granulocytes: 1 %
Lymphocytes Relative: 22 %
Lymphs Abs: 2.2 10*3/uL (ref 0.7–4.0)
MCH: 29.9 pg (ref 26.0–34.0)
MCHC: 33.9 g/dL (ref 30.0–36.0)
MCV: 88.4 fL (ref 80.0–100.0)
Monocytes Absolute: 0.7 10*3/uL (ref 0.1–1.0)
Monocytes Relative: 7 %
Neutro Abs: 6.7 10*3/uL (ref 1.7–7.7)
Neutrophils Relative %: 67 %
Platelets: 256 10*3/uL (ref 150–400)
RBC: 3.54 MIL/uL — ABNORMAL LOW (ref 4.22–5.81)
RDW: 16.5 % — ABNORMAL HIGH (ref 11.5–15.5)
WBC: 10.1 10*3/uL (ref 4.0–10.5)
nRBC: 0 % (ref 0.0–0.2)

## 2023-03-25 LAB — COMPREHENSIVE METABOLIC PANEL
ALT: 114 U/L — ABNORMAL HIGH (ref 0–44)
AST: 153 U/L — ABNORMAL HIGH (ref 15–41)
Albumin: 1.8 g/dL — ABNORMAL LOW (ref 3.5–5.0)
Alkaline Phosphatase: 593 U/L — ABNORMAL HIGH (ref 38–126)
Anion gap: 7 (ref 5–15)
BUN: 9 mg/dL (ref 8–23)
CO2: 22 mmol/L (ref 22–32)
Calcium: 8.3 mg/dL — ABNORMAL LOW (ref 8.9–10.3)
Chloride: 106 mmol/L (ref 98–111)
Creatinine, Ser: 1.07 mg/dL (ref 0.61–1.24)
GFR, Estimated: >60 mL/min (ref >60)
Glucose, Bld: 98 mg/dL (ref 70–99)
Potassium: 3 mmol/L — ABNORMAL LOW (ref 3.5–5.1)
Sodium: 135 mmol/L (ref 135–145)
Total Bilirubin: 11.8 mg/dL — ABNORMAL HIGH (ref 0.3–1.2)
Total Protein: 6.4 g/dL — ABNORMAL LOW (ref 6.5–8.1)

## 2023-03-25 LAB — PROTIME-INR
INR: 1.5 — ABNORMAL HIGH (ref 0.8–1.2)
Prothrombin Time: 18.3 seconds — ABNORMAL HIGH (ref 11.4–15.2)

## 2023-03-25 LAB — HIV ANTIBODY (ROUTINE TESTING W REFLEX): HIV Screen 4th Generation wRfx: NONREACTIVE

## 2023-03-25 LAB — APTT: aPTT: 33 seconds (ref 24–36)

## 2023-03-25 MED ORDER — PANTOPRAZOLE SODIUM 40 MG PO TBEC
40.0000 mg | DELAYED_RELEASE_TABLET | Freq: Every day | ORAL | Status: DC
  Administered 2023-03-25 – 2023-03-28 (×4): 40 mg via ORAL
  Filled 2023-03-25 (×4): qty 1

## 2023-03-25 MED ORDER — FOLIC ACID 1 MG PO TABS
1.0000 mg | ORAL_TABLET | Freq: Every day | ORAL | Status: DC
  Administered 2023-03-25 – 2023-03-28 (×4): 1 mg via ORAL
  Filled 2023-03-25 (×4): qty 1

## 2023-03-25 MED ORDER — GADOBUTROL 1 MMOL/ML IV SOLN
10.0000 mL | Freq: Once | INTRAVENOUS | Status: AC | PRN
  Administered 2023-03-25: 10 mL via INTRAVENOUS

## 2023-03-25 MED ORDER — POTASSIUM CHLORIDE CRYS ER 20 MEQ PO TBCR
40.0000 meq | EXTENDED_RELEASE_TABLET | Freq: Once | ORAL | Status: DC
Start: 2023-03-25 — End: 2023-03-25

## 2023-03-25 MED ORDER — RISAQUAD PO CAPS
1.0000 | ORAL_CAPSULE | Freq: Every day | ORAL | Status: DC
  Administered 2023-03-25 – 2023-03-27 (×3): 1 via ORAL
  Filled 2023-03-25 (×3): qty 1

## 2023-03-25 MED ORDER — VITAMIN D 25 MCG (1000 UNIT) PO TABS
1000.0000 [IU] | ORAL_TABLET | Freq: Every day | ORAL | Status: DC
  Administered 2023-03-25 – 2023-03-28 (×4): 1000 [IU] via ORAL
  Filled 2023-03-25 (×4): qty 1

## 2023-03-25 MED ORDER — POTASSIUM CHLORIDE CRYS ER 20 MEQ PO TBCR
40.0000 meq | EXTENDED_RELEASE_TABLET | Freq: Two times a day (BID) | ORAL | Status: AC
  Administered 2023-03-25 – 2023-03-26 (×3): 40 meq via ORAL
  Filled 2023-03-25 (×3): qty 2

## 2023-03-25 MED ORDER — VITAMIN B-12 1000 MCG PO TABS
2000.0000 ug | ORAL_TABLET | Freq: Every day | ORAL | Status: DC
  Administered 2023-03-25 – 2023-03-28 (×4): 2000 ug via ORAL
  Filled 2023-03-25 (×4): qty 2

## 2023-03-25 NOTE — Progress Notes (Signed)
Pt has gone down for MRI at this time, pt has no concerns at this time. Wife is at the bedside.

## 2023-03-25 NOTE — Consult Note (Addendum)
Consultation  Referring Provider: TRH/ Pokrel Primary Care Physician:  Benita Stabile, MD Primary Gastroenterologist:  Dr. Halford Decamp  Reason for Consultation: Jaundice  HPI: Alex Thomas is a 67 y.o. male, who was admitted yesterday after he presented with new onset of jaundice over the past few days.  Patient has history of ulcerative colitis, diagnosed in March 2023 and has been under the care of of Dr. Neoma Laming GI.  He is currently on Entyvio and was recently increased to every 4 weeks. He also carries a new diagnosis of PSC.  He has history of prior partial gastric resection secondary to gastric ulcers. he was diagnosed by MRI in April 2023, he has not had any evidence of cirrhosis.  He had been referred to [facility] and actually had his initial appointment with hepatology there with Dr. Thedora Hinders in June.  Changes were made to his regimen and plan was for him to follow-up in 1 year.  He did have MRI last MRCP done on 03/09/2023 because labs had shown an elevation of LFTs.  She has a severely atrophic or surgically absent left lobe and numerous surgical clips in the adjacent abdomen similar appearance of intrahepatic ductal dilation within the remnant left lobe of the liver without right-sided biliary ductal dilation or any common bile duct dilation.  This did note a long segment of bowel wall thickening and mucosal hyperenhancement involving the transverse, and proximal descending colon similar to prior exam suggestive of IBD, bilateral renal calculi, and cardiomegaly.  Patient says that he was diagnosed with a urinary tract infection by his PCP Dr. Margo Aye within the past 2 weeks.  He says he did have urine studies done and a culture, he was seen because of some darkening of his urine.  He did take a course of Cipro.  Earlier this week he was told by someone he worked with that he looked jaundiced.  Repeat labs were done and he was referred to the ER. Labs done 03/24/2023 shows  T. bili 14.1/ALT 12/16/1949/AST 178/ALT 127 Creatinine 1.3/albumin 2.2  Last month on 02/28/2023 T. bili was 1.2/alk phos 625/AST 70 and ALT 85.   Repeat labs today WBC 10.1/hemoglobin 10.6/hematocrit 31 INR 1.5 Potassium 3.0 T. bili 11.8/alk phos 593/AST 153/ALT 114  Ultrasound yesterday no evidence of gallstones, CBD of 3.3 mm and the left lobe of the liver not visualized.  Says he has not had any recent fever or chills, he has been feeling a bit fatigued, no nausea or vomiting and no abdominal pain.  He says he is not certain how well the [facility] Grayer is working because he still has loose stools only having 2-3 bowel movements per day.  He had been given some samples of Restora by his PCP and that seemed to help firm up his stool.      Past Medical History:  Diagnosis Date   Bladder cancer [facility])    age 64   History of kidney stones    Multiple gastric ulcers     Past Surgical History:  Procedure Laterality Date   APPENDECTOMY     BIOPSY  11/23/2021   Procedure: BIOPSY;  Surgeon: Dolores Frame, MD;  [facility];  Service: Gastroenterology;;   BIOPSY  09/23/2022   Procedure: BIOPSY;  Surgeon: Dolores Frame, MD;  [facility];  Service: Gastroenterology;;   COLONOSCOPY WITH PROPOFOL N/A 11/23/2021   Procedure: COLONOSCOPY WITH PROPOFOL;  Surgeon: Dolores Frame, MD;  [facility];  Service: Gastroenterology;  Laterality: N/A;  805 ASA 1   COLONOSCOPY WITH PROPOFOL N/A 09/23/2022   Procedure: COLONOSCOPY WITH PROPOFOL;  Surgeon: Dolores Frame, MD;  [facility];  Service: Gastroenterology;  Laterality: N/A;  1:00pm, asa 2, pt knows to arrive at 10:00   GASTRECTOMY     HERNIA REPAIR     KIDNEY STONE SURGERY     POLYPECTOMY  11/23/2021   Procedure: POLYPECTOMY;  Surgeon: Dolores Frame, MD;  [facility];  Service: Gastroenterology;;   Susa Day  09/23/2022   Procedure:  Susa Day;  Surgeon: Marguerita Merles, Reuel Boom, MD;  [facility];  Service: Gastroenterology;;    Prior to Admission medications   Medication Sig Start Date End Date Taking? Authorizing Provider  ciprofloxacin (CIPRO) 250 MG tablet Take 250 mg by mouth 2 (two) times daily.   Yes [provider]  Cyanocobalamin (VITAMIN B-12) 1000 MCG SUBL Place 2 tablets (2,000 mcg total) under the tongue daily. 03/24/22  Yes Dolores Frame, MD  folic acid (FOLVITE) 1 MG tablet Take 1 tablet (1 mg total) by mouth daily. 03/24/22  Yes Dolores Frame, MD  Probiotic Product [facility]) CAPS Take 1 capsule by mouth daily. 03/23/23  Yes [provider]  vedolizumab (ENTYVIO) 300 MG injection Inject 300 mg into the vein every 8 (eight) weeks.   Yes [provider]  VITAMIN D PO Take 1 tablet by mouth daily.   Yes [provider]    Current Facility-Administered Medications  Medication Dose Route Frequency Provider Last Rate Last Admin   acidophilus (RISAQUAD) capsule 1 capsule  1 capsule Oral Daily Pokhrel, Laxman, MD       cholecalciferol (VITAMIN D3) 25 MCG (1000 UNIT) tablet 1,000 Units  1,000 Units Oral Daily Pokhrel, Laxman, MD       ciprofloxacin (CIPRO) IVPB 400 mg  400 mg Intravenous Q12H Nolberto Hanlon, MD 200 mL/hr at 03/25/23 1152 400 mg at 03/25/23 1152   cyanocobalamin (VITAMIN B12) tablet 2,000 mcg  2,000 mcg Oral Daily Pokhrel, Laxman, MD       folic acid (FOLVITE) tablet 1 mg  1 mg Oral Daily Pokhrel, Laxman, MD       lactated ringers infusion   Intravenous Continuous Nolberto Hanlon, MD 100 mL/hr at 03/25/23 0612 New Bag at 03/25/23 0612   pantoprazole (PROTONIX) EC tablet 40 mg  40 mg Oral Daily Pokhrel, Laxman, MD       potassium chloride SA (KLOR-CON M) CR tablet 40 mEq  40 mEq Oral BID Pokhrel, Laxman, MD       sodium chloride flush (NS) 0.9 % injection 3 mL  3 mL Intravenous Q12H Nolberto Hanlon, MD        Allergies as of 03/24/2023    (No Known Allergies)    Family History  Problem Relation Age of Onset   Colon cancer Mother     Social History   Socioeconomic History   Marital status: Married    Spouse name: Not on file   Number of children: Not on file   Years of education: Not on file   Highest education level: Not on file  Occupational History   Not on file  Tobacco Use   Smoking status: Never    Passive exposure: Past   Smokeless tobacco: Never  Vaping Use   Vaping status: Never Used  Substance and Sexual Activity   Alcohol use: Not Currently   Drug use: Not Currently   Sexual activity:  Not on file  Other Topics Concern   Not on file  Social History Narrative   Not on file   Social Determinants of Health   Financial Resource Strain: Not on file  Food Insecurity: Not on file  Transportation Needs: Not on file  Physical Activity: Not on file  Stress: Not on file  Social Connections: Not on file  Intimate Partner Violence: Not on file    Review of Systems: Pertinent positive and negative review of systems were noted in the above HPI section.  All other review of systems was otherwise negative.   Physical Exam: Vital signs in last 24 hours: Temp:  [98 F (36.7 C)-98.3 F (36.8 C)] 98 F (36.7 C) (07/13 0401) Pulse Rate:  [35-77] 68 (07/13 0914) Resp:  [12-20] 18 (07/13 0914) BP: (99-131)/(61-88) 99/61 (07/13 0914) SpO2:  [97 %-100 %] 99 % (07/13 0914) Weight:  [137.6 kg] 137.6 kg (07/12 2332)   General:   Alert,  Well-developed, well-nourished, white male pleasant and cooperative in NAD jaundiced Head:  Normocephalic and atraumatic. Eyes:  Sclera icteric conjunctiva pink. Ears:  Normal auditory acuity. Nose:  No deformity, discharge,  or lesions. Mouth:  No deformity or lesions.   Neck:  Supple; no masses or thyromegaly. Lungs:  Clear throughout to auscultation.   No wheezes, crackles, or rhonchi.  Heart:  Regular rate and rhythm; no murmurs, clicks, rubs,  or  gallops. Abdomen:  Soft obese, nontender BS active,nonpalp mass or hsm.   Rectal: Not done Msk:  Symmetrical without gross deformities. . Pulses:  Normal pulses noted. Extremities:  Without clubbing or edema. Neurologic:  Alert and  oriented x4;  grossly normal neurologically. Skin: Jaundiced.Marland Kitchen Psych:  Alert and cooperative. Normal mood and affect.  Intake/Output from previous day: 07/12 0701 - 07/13 0700 In: 323.3 [P.O.:120; I.V.:3.3; IV Piggyback:200] Out: 1100 [Urine:1100] Intake/Output this shift: Total I/O In: -  Out: 520 [Urine:520]  Lab Results: Recent Labs    03/24/23 1432 03/25/23 0507  WBC 10.6* 10.1  HGB 11.8* 10.6*  HCT 37.5* 31.3*  PLT 283 256   BMET Recent Labs    03/24/23 1432 03/25/23 0507  NA 137 135  K 3.9 3.0*  CL 102 106  CO2 21* 22  GLUCOSE 89 98  BUN 8 9  CREATININE 1.31* 1.07  CALCIUM 8.7* 8.3*   LFT Recent Labs    03/24/23 1747 03/25/23 0507  PROT  --  6.4*  ALBUMIN  --  1.8*  AST  --  153*  ALT  --  114*  ALKPHOS  --  593*  BILITOT 13.4* 11.8*  BILIDIR 8.3*  --   IBILI 5.1*  --    PT/INR Recent Labs    03/24/23 1747 03/25/23 0507  LABPROT 17.9* 18.3*  INR 1.5* 1.5*      IMPRESSION:  #55 67 year old white male diagnosed with pan ulcerative colitis 2023, on Entyvio every 4 weeks # 2  diagnosis of PSC made this year-he had initial appointment with [facility] hepatology a couple of weeks ago, no changes to his IBD regimen and plans were to follow him up in 1 year.  #3 new onset of jaundice this past week-he had been noted to have an rise in LFTs a couple of weeks ago but no jaundice. MRI/MRCP 2-1/2 weeks ago with findings of a severely atrophic left lobe, and similar appearance of intrahepatic ductal dilation within the remnant left lobe no right-sided biliary ductal dilation or common bile duct dilation, somewhat heterogeneous early  enhancement of the liver parenchyma no focal liver lesion, he does still have long segment of wall  thickening in the transverse and left colon  Etiology of jaundice is likely secondary to stricturing within the left intrahepatic duct with obstruction, consider DI LI/drug-induced liver injury secondary to recent course of Cipro , Rule out neoplasm  #4 bilateral renal calculi #5 cardiomegaly #6 mild normocytic anemia #6 status 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     Amy Esterwood PA-C 03/25/2023, 3:12 PM     Attending physician's note   I have taken history, reviewed the chart and examined the patient. I performed a substantive portion of this encounter, including complete performance of at least one of the key components, in conjunction with the APP. I agree with the Advanced Practitioner's note, impression and recommendations.   New painless Obs jaundice in pt with known PSC (followed at [facility]). Prev MRCP 02/2023 with atropic L lobe of liver with L intrahepatic ductal dilatation similar to 12/2021 (?ischemic). CA 19-9 (7/5): 60 (N 0-35). No s/s ascending cholangitis.   pUC on entiyo q4weeky with good response.  Dose frequency was increased after last colon 09/2022 which showed mild colitis with low-grade dysplasia.  Remote partial gastrectomy d/t PUD (appears to be B1 on prev CT- will run in by radiology in AM).  Subsequently had incisional hernia requiring mesh placement  Recent UTI-treated with Cipro.     Latest Ref Rng & Units 03/25/2023    5:07 AM 03/24/2023    5:47 PM 03/24/2023    2:32 PM  Hepatic Function  Total Protein 6.5 - 8.1 g/dL 6.4   6.9   Albumin 3.5 - 5.0 g/dL 1.8   2.2   AST 15 - 41 U/L 153   178   ALT 0 - 44 U/L 114   127   Alk Phosphatase 38 - 126 U/L 593   651   Total Bilirubin 0.3 - 1.2 mg/dL 16.1  09.6  04.5   Bilirubin, Direct 0.0 - 0.2 mg/dL  8.3     Nl TB 1.2, 3 weeks ago Surprisingly his  Alb has reduced significantly as well.   Plan: -MRCP today -Trend LFTs esp alk phos, CBC -Recheck CA 19-9 -Will ask radiology to review previous CT/MR to determine nature of gastric surgery (B1 or B2) -D/W pt in detail. He would like further WU at [facility], if needed- ERCP vs liver Bx vs both.   Edman Circle, MD [facility] [phone]

## 2023-03-25 NOTE — Progress Notes (Addendum)
PROGRESS NOTE    Alex Thomas  NWG:[id number] DOB: August 29, 1956 DOA: 03/24/2023 PCP: Benita Stabile, MD    Brief Narrative:  Alex Thomas is a 67 y.o. male with past medical history significant of for inflammatory bowel disease who presented to hospital with 2-5 bowel movements.  Patient receives Entyvio infusions every 4 weeks for the last 2 years.  Patient was in his usual state of health till approximately 4 weeks ago when an acquaintance noticed that the patient was having yellowing of eyes.  Since then patient has had slight progression of his eye yellowing as well as noticing darkening of his urine.  Patient was evaluated by PCP 3 days prior to this presentation.  Urine culture at that time showed UTI and was on ciprofloxacin.  Blood cultures done by PCP showed abnormal levels of bilirubin show was asked to come to the hospital.    Assessment and plan  Jaundice, history of primary sclerosing cholangitis Prior to any suggestive cholestatic  disease with mild coagulopathy.  Received vitamin K.  On entyvio infusion every 4 weeks as requested.  Ultrasound of the right upper quadrant without any acute findings.  GI has been consulted and at this time plan is to proceed with MRI/MRCP.  Follows up with [facility] hepatology and [facility] gastroenterology as outpatient..  Recent diagnosis of UTI (urinary tract infection) No urinary symptoms at this time.  Patient was ciprofloxacin which has been continued.    Hypokalemia.  Will replace orally.  Check levels in AM.   CKD (chronic kidney disease) stage II. Seems to be chronic, with baseline creatinine creatinine fluctuating between 1 and 1.3.  Monitor BMP in AM.   Ulcerative pancolitis Diagnosed March 2023 maintained on Entyvio infusion  Follow GI input.  History of peptic ulcer disease.  Status post partial gastric resection.  Add PPI.    DVT prophylaxis: SCDs Start: 03/24/23 2326   Code Status:     Code Status: Full Code  Disposition: Home  likely in 1 to 2 days.  Status is: Inpatient Remains inpatient appropriate because: Workup for jaundice, GI consultation   Family Communication: None at bedside  Consultants:  GI  Procedures:  None yet  Antimicrobials:  Ciprofloxacin  Anti-infectives (From admission, onward)    Start     Dose/Rate Route Frequency Ordered Stop   03/25/23 0015  ciprofloxacin (CIPRO) IVPB 400 mg        400 mg 200 mL/hr over 60 Minutes Intravenous Every 12 hours 03/24/23 2325 03/26/23 1214      Subjective: Today, patient was seen and examined at bedside.  Patient feels a little better today.  States that his urine color has lightened up some.  Denies any nausea or vomiting.  No overt pain.  Objective: Vitals:   03/24/23 2332 03/24/23 2332 03/25/23 0401 03/25/23 0914  BP:  127/66 115/68 99/61  Pulse:  72 (!) 59 68  Resp:  18 20 18   Temp:  98.1 F (36.7 C) 98 F (36.7 C)   TempSrc:  Oral Oral   SpO2:  99% 100% 99%  Weight: (!) 137.6 kg     Height: 6\' 3"  (1.905 m)       Intake/Output Summary (Last 24 hours) at 03/25/2023 1058 Last data filed at 03/25/2023 0614 Gross per 24 hour  Intake 323.33 ml  Output 1100 ml  Net -776.67 ml   Filed Weights   03/24/23 2332  Weight: (!) 137.6 kg    Physical Examination: Body mass index is  37.92 kg/m.   General: Obese built, not in obvious distress HENT:   Icterus noted.  Oral mucosa is moist.  Chest:  Clear breath sounds.  No crackles or wheezes.  CVS: S1 &S2 heard. No murmur.  Regular rate and rhythm. Abdomen: Soft, nontender, nondistended.  Bowel sounds are heard.   Extremities: No cyanosis, clubbing or edema.  Peripheral pulses are palpable. Psych: Alert, awake and oriented, normal mood CNS:  No cranial nerve deficits.  Power equal in all extremities.   Skin: Warm and dry.  No rashes noted.  Data Reviewed:   CBC: Recent Labs  Lab 03/24/23 1432 03/25/23 0507  WBC 10.6* 10.1  NEUTROABS  --  6.7  HGB 11.8* 10.6*  HCT 37.5* 31.3*   MCV 92.8 88.4  PLT 283 256    Basic Metabolic Panel: Recent Labs  Lab 03/24/23 1432 03/25/23 0507  NA 137 135  K 3.9 3.0*  CL 102 106  CO2 21* 22  GLUCOSE 89 98  BUN 8 9  CREATININE 1.31* 1.07  CALCIUM 8.7* 8.3*    Liver Function Tests: Recent Labs  Lab 03/24/23 1432 03/24/23 1747 03/25/23 0507  AST 178*  --  153*  ALT 127*  --  114*  ALKPHOS 651*  --  593*  BILITOT 14.1* 13.4* 11.8*  PROT 6.9  --  6.4*  ALBUMIN 2.2*  --  1.8*     Radiology Studies: US Abdomen Limited RUQ (LIVER/GB)  Result Date: 03/24/2023 CLINICAL DATA:  Right upper quadrant abdominal pain EXAM: ULTRASOUND ABDOMEN LIMITED RIGHT UPPER QUADRANT COMPARISON:  Previous studies including the MRI none on 03/09/2023 FINDINGS: Gallbladder: No gallstones or wall thickening visualized. No sonographic Murphy sign noted by sonographer. Common bile duct: Diameter: 3.3 mm Liver: Left lobe of liver is not visualized consistent with history of atrophy. There is increased echogenicity in liver. No focal abnormalities are seen in visualized portions of liver. Portal vein is patent on color Doppler imaging with normal direction of blood flow towards the liver. Other: None. IMPRESSION: No acute findings are seen in right upper quadrant abdomen sonogram. Electronically Signed   By: Ernie Avena M.D.   On: 03/24/2023 17:40      LOS: 1 day   Joycelyn Das, MD [facility] Available via Epic secure chat 7am-7pm After these hours, please refer to coverage provider listed on [email or web address] 03/25/2023, 10:58 AM

## 2023-03-25 NOTE — Progress Notes (Signed)
Pt has returned from MRI , pt has no concerns at this time.

## 2023-03-25 NOTE — Hospital Course (Addendum)
Alex Thomas is a 67 y.o. male with past medical history significant of for inflammatory bowel disease who presented to hospital with 2-5 bowel movements.  Patient receives Entyvio infusions every 4 weeks for the last 2 years.  Patient was in his usual state of health till approximately 4 weeks ago when an acquaintance noticed that the patient was having yellowing of eyes.  Since then patient has had slight progression of his eye yellowing as well as noticing darkening of his urine.  Patient was evaluated by PCP 3 days prior to this presentation.  Urine culture at that time showed UTI and was on ciprofloxacin.  Blood cultures done by PCP showed abnormal levels of bilirubin show was asked to come to the hospital.    Assessment and plan  Jaundice, history of primary sclerosing cholangitis Prior to any suggestive cholestatic  disease with mild coagulopathy.  Received vitamin K.  Ultrasound of the right upper quadrant without any acute findings.  GI has been consulted and at this time plan is to proceed with MRI MRCP.  Follows up with [facility] hepatology and [facility] gastroenterology as outpatient..  Recent diagnosis of UTI (urinary tract infection) No urinary symptoms at this time.  Patient was ciprofloxacin which has been continued.     CKD (chronic kidney disease) stage II. Seems to be chronic, with baseline creatinine creatinine fluctuating between 1 and 1.3.    Ulcerative pancolitis Diagnosed March 2023 maintained on Entyvio infusion  Follow GI input.  History of peptic ulcer disease.  Status post partial gastric resection.  Add PPI.

## 2023-03-26 DIAGNOSIS — K831 Obstruction of bile duct: Secondary | ICD-10-CM | POA: Diagnosis not present

## 2023-03-26 DIAGNOSIS — I517 Cardiomegaly: Secondary | ICD-10-CM | POA: Diagnosis not present

## 2023-03-26 DIAGNOSIS — K5190 Ulcerative colitis, unspecified, without complications: Secondary | ICD-10-CM | POA: Diagnosis not present

## 2023-03-26 DIAGNOSIS — K51019 Ulcerative (chronic) pancolitis with unspecified complications: Secondary | ICD-10-CM | POA: Diagnosis not present

## 2023-03-26 DIAGNOSIS — N186 End stage renal disease: Secondary | ICD-10-CM

## 2023-03-26 DIAGNOSIS — N182 Chronic kidney disease, stage 2 (mild): Secondary | ICD-10-CM | POA: Diagnosis not present

## 2023-03-26 DIAGNOSIS — N200 Calculus of kidney: Secondary | ICD-10-CM | POA: Diagnosis not present

## 2023-03-26 DIAGNOSIS — R17 Unspecified jaundice: Secondary | ICD-10-CM | POA: Diagnosis not present

## 2023-03-26 LAB — MAGNESIUM: Magnesium: 1.7 mg/dL (ref 1.7–2.4)

## 2023-03-26 LAB — CBC
HCT: 36.3 % — ABNORMAL LOW (ref 39.0–52.0)
Hemoglobin: 12 g/dL — ABNORMAL LOW (ref 13.0–17.0)
MCH: 29.7 pg (ref 26.0–34.0)
MCHC: 33.1 g/dL (ref 30.0–36.0)
MCV: 89.9 fL (ref 80.0–100.0)
Platelets: 337 10*3/uL (ref 150–400)
RBC: 4.04 MIL/uL — ABNORMAL LOW (ref 4.22–5.81)
RDW: 16.6 % — ABNORMAL HIGH (ref 11.5–15.5)
WBC: 11.4 10*3/uL — ABNORMAL HIGH (ref 4.0–10.5)
nRBC: 0 % (ref 0.0–0.2)

## 2023-03-26 LAB — HEPATITIS PANEL, ACUTE
HCV Ab: NONREACTIVE
Hep A IgM: NONREACTIVE
Hep B C IgM: NONREACTIVE
Hepatitis B Surface Ag: NONREACTIVE

## 2023-03-26 LAB — COMPREHENSIVE METABOLIC PANEL
ALT: 120 U/L — ABNORMAL HIGH (ref 0–44)
AST: 160 U/L — ABNORMAL HIGH (ref 15–41)
Albumin: 2 g/dL — ABNORMAL LOW (ref 3.5–5.0)
Alkaline Phosphatase: 623 U/L — ABNORMAL HIGH (ref 38–126)
Anion gap: 10 (ref 5–15)
BUN: 8 mg/dL (ref 8–23)
CO2: 23 mmol/L (ref 22–32)
Calcium: 8.4 mg/dL — ABNORMAL LOW (ref 8.9–10.3)
Chloride: 101 mmol/L (ref 98–111)
Creatinine, Ser: 1.18 mg/dL (ref 0.61–1.24)
GFR, Estimated: >60 mL/min (ref >60)
Glucose, Bld: 85 mg/dL (ref 70–99)
Potassium: 3.3 mmol/L — ABNORMAL LOW (ref 3.5–5.1)
Sodium: 134 mmol/L — ABNORMAL LOW (ref 135–145)
Total Bilirubin: 14.1 mg/dL — ABNORMAL HIGH (ref 0.3–1.2)
Total Protein: 6.8 g/dL (ref 6.5–8.1)

## 2023-03-26 LAB — FERRITIN: Ferritin: 202 ng/mL (ref 24–336)

## 2023-03-26 LAB — PROTIME-INR
INR: 1.3 — ABNORMAL HIGH (ref 0.8–1.2)
Prothrombin Time: 16.1 seconds — ABNORMAL HIGH (ref 11.4–15.2)

## 2023-03-26 MED ORDER — VITAMIN K1 10 MG/ML IJ SOLN
1.0000 mg | Freq: Once | INTRAVENOUS | Status: AC
  Administered 2023-03-26: 1 mg via INTRAVENOUS
  Filled 2023-03-26 (×2): qty 0.1

## 2023-03-26 NOTE — Plan of Care (Signed)

## 2023-03-26 NOTE — Progress Notes (Addendum)
Patient ID: Alex Thomas, male   DOB: 1956-08-02, 67 y.o.   MRN: [id number]    Progress Note   Subjective   Day # 2 CC; jaundice in setting of previously diagnosed PSC Patient with UC on Entyvio  MRI/MRCP-atrophic left lobe of the liver again noted, no suspicious appearing hepatic lesions considerable patient motion however within that limitation no significant intra or extrahepatic biliary ductal dilation there is some mild increased T2 signal intensity adjacent to the portal triad suggesting periportal edema, no choledocholithiasis or evidence to suggest biliary obstruction, gallbladder unremarkable, pancreas unremarkable, spleen normal size, surgical clips from prior partial gastrectomy, no significant ascites  Labs-CA 19-9 pending WBC 11.4/hemoglobin 12/hematocrit 36.3 Sodium 134/potassium 3.3/creatinine 1.18 T. bili 14/alk phos 623/AST 160/ALT 120 INR 1.5 yesterday  Patient says he feels okay, no specific complaints still no complaint of abdominal discomfort or nausea eating without difficulty, has been afebrile    Objective   Vital signs in last 24 hours: Temp:  [98 F (36.7 C)-98.9 F (37.2 C)] 98.4 F (36.9 C) (07/14 0857) Pulse Rate:  [65-74] 65 (07/14 0857) Resp:  [18-19] 18 (07/14 0857) BP: (113-130)/(64-69) 113/64 (07/14 0857) SpO2:  [97 %-100 %] 97 % (07/14 0857) Last BM Date : 03/26/23 General: Older white male in NAD-jaundiced, pleasant Heart:  Regular rate and rhythm; no murmurs Lungs: Respirations even and unlabored, lungs CTA bilaterally Abdomen:  Soft, obese,nontender and nondistended. Normal bowel sounds. Extremities:  Without edema. Neurologic:  Alert and oriented,  grossly normal neurologically. Psych:  Cooperative. Normal mood and affect.  Intake/Output from previous day: 07/13 0701 - 07/14 0700 In: 1257.1 [P.O.:480; I.V.:564.3; IV Piggyback:212.8] Out: 5765 [Urine:5765] Intake/Output this shift: Total I/O In: 240 [P.O.:240] Out: 500  [Urine:500]  Lab Results: Recent Labs    03/24/23 1432 03/25/23 0507 03/26/23 0230  WBC 10.6* 10.1 11.4*  HGB 11.8* 10.6* 12.0*  HCT 37.5* 31.3* 36.3*  PLT 283 256 337   BMET Recent Labs    03/24/23 1432 03/25/23 0507 03/26/23 0230  NA 137 135 134*  K 3.9 3.0* 3.3*  CL 102 106 101  CO2 21* 22 23  GLUCOSE 89 98 85  BUN 8 9 8   CREATININE 1.31* 1.07 1.18  CALCIUM 8.7* 8.3* 8.4*   LFT Recent Labs    03/24/23 1747 03/25/23 0507 03/26/23 0230  PROT  --    < > 6.8  ALBUMIN  --    < > 2.0*  AST  --    < > 160*  ALT  --    < > 120*  ALKPHOS  --    < > 623*  BILITOT 13.4*   < > 14.1*  BILIDIR 8.3*  --   --   IBILI 5.1*  --   --    < > = values in this interval not displayed.   PT/INR Recent Labs    03/24/23 1747 03/25/23 0507  LABPROT 17.9* 18.3*  INR 1.5* 1.5*    Studies/Results: MR ABDOMEN MRCP W WO CONTAST  Result Date: 03/26/2023 CLINICAL DATA:  67 year old male with history of jaundice. EXAM: MRI ABDOMEN WITHOUT AND WITH CONTRAST (INCLUDING MRCP) TECHNIQUE: Multiplanar multisequence MR imaging of the abdomen was performed both before and after the administration of intravenous contrast. Heavily T2-weighted images of the biliary and pancreatic ducts were obtained, and three-dimensional MRCP images were rendered by post processing. CONTRAST:  10mL GADAVIST GADOBUTROL 1 MMOL/ML IV SOLN COMPARISON:  Abdominal MRI 03/09/2023. Abdominal ultrasound 03/24/2023. FINDINGS: Comment: Portions of today's  examination are limited by considerable patient respiratory motion. Lower chest: Unremarkable. Hepatobiliary: Atrophy of the left lobe of the liver again noted. No suspicious appearing hepatic lesions are noted. MRCP images are limited by considerable patient motion. With these limitations in mind, there is no significant intra or extrahepatic biliary ductal dilatation. Mild increased T2 signal intensity is noted adjacent to the portal triads, suggesting periportal edema.  Gallbladder is unremarkable in appearance. Pancreas: No pancreatic mass. No pancreatic ductal dilatation. No pancreatic or peripancreatic fluid collections or inflammatory changes. Spleen:  Unremarkable. Adrenals/Urinary Tract: Multiple small parapelvic cysts are noted in both kidneys (benign requiring no imaging follow-up). No aggressive appearing renal lesions are noted. Expansion of the renal sinus fat bilaterally, unusual, but similar to prior studies and presumably benign. There are multiple filling defects associated with the collecting systems of both kidneys, presumably nonobstructive calculi, measuring up to 1.3 cm in the lower pole collecting system of the right kidney and 1.1 cm in the lower pole collecting system of the left kidney, similar to remote prior CT examination 12/30/2021. No hydroureteronephrosis in the visualized portions of the abdomen. Bilateral adrenal glands are normal in appearance. Stomach/Bowel: Numerous areas of susceptibility artifact are noted in the upper abdomen adjacent to the distal stomach related to surgical clips from prior partial gastrectomy. Visualized portions are otherwise unremarkable. Vascular/Lymphatic: No aneurysm identified in the visualized abdominal vasculature. No lymphadenopathy noted in the abdomen. Other: No significant volume of ascites noted in the visualized portions of the peritoneal cavity. Musculoskeletal: No aggressive appearing osseous lesions are noted in the visualized portions of the skeleton. IMPRESSION: 1. Mild periportal edema in the liver, which is a nonspecific finding that can be seen in the setting of hepatitis. Correlation with liver function tests is recommended. 2. No other acute findings to account for the patient's history of jaundice. Specifically, no choledocholithiasis or findings to suggest biliary obstruction. 3. Chronic atrophy of the left lobe of the liver, similar to prior studies. 4. Numerous nonobstructive calculi in the  collecting systems of both kidneys. 5. Additional incidental findings, as above. Electronically Signed   By: Trudie Reed M.D.   On: 03/26/2023 09:31   US Abdomen Limited RUQ (LIVER/GB)  Result Date: 03/24/2023 CLINICAL DATA:  Right upper quadrant abdominal pain EXAM: ULTRASOUND ABDOMEN LIMITED RIGHT UPPER QUADRANT COMPARISON:  Previous studies including the MRI none on 03/09/2023 FINDINGS: Gallbladder: No gallstones or wall thickening visualized. No sonographic Murphy sign noted by sonographer. Common bile duct: Diameter: 3.3 mm Liver: Left lobe of liver is not visualized consistent with history of atrophy. There is increased echogenicity in liver. No focal abnormalities are seen in visualized portions of liver. Portal vein is patent on color Doppler imaging with normal direction of blood flow towards the liver. Other: None. IMPRESSION: No acute findings are seen in right upper quadrant abdomen sonogram. Electronically Signed   By: Ernie Avena M.D.   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 Karl Pock [facility] with initial diagnosis March 2023  #2 PSC-diagnosed by MRI 2023, has not had any evidence of cirrhosis.  He had been referred to [facility] and had his initial consultation there in June.  Dr.Dinani-changes in his regimen made, and plan was for 1 year follow-up  #3 onset of jaundice over the past week had been noted to have elevated LFTs over the past couple of weeks.  MRI/MRCP 2 weeks prior to admission showed a severely atrophic left lobe of the liver and similar appearance of intrahepatic ductal dilation within the remnant left lobe, no right-sided ductal dilation or common bile duct dilation, heterogeneous early enhancement of the liver no focal liver lesions, there was evidence for active colitis with a long segment of thickening in the transverse and left colon   There was  concern for intrahepatic duct obstruction/stricture in the left system however MRI/MRCP does not show any obvious obstruction.  There is some edema at the porta hepatis  Etiology of the jaundice is not entirely clear at this time still need to consider DI LI/drug-induced liver injury with recent course of Cipro for UTI superimposed on his underlying PSC Consider small duct PSC Will workup for other etiologies of chronic liver disease  Parameters are stable  #4 mild coagulopathy-vitamin K today #5 normocytic anemia secondary to above #6 cardiomegaly #7 chronic kidney disease stage II #8 history of partial gastrectomy secondary to remote peptic ulcer disease  Plan; need to observe off Cipro he did get a dose of Cipro here now discontinued Multiple other hepatic autoimmune labs, inheritable labs etc. have been ordered today  If he does not have improvement in the jaundice will need to consider transjugular liver biopsy, and hepatic wedge pressure gradient He is established with hepatology at [facility], and if parameters stabilize he may be best served by getting back quickly to hepatology there and if liver biopsy indicated to have that done through [facility].            Principal Problem:   Obstructive jaundice Active Problems:   Ulcerative colitis (HCC)   CKD (chronic kidney disease)   UTI (urinary tract infection)   Jaundice     LOS: 2 days   Amy Esterwood PA-C 03/26/2023, 11:14 AM     Attending physician's note   I have taken history, reviewed the chart and examined the patient. I performed a substantive portion of this encounter, including complete performance of at least one of the key components, in conjunction with the APP. I agree with the Advanced Practitioner's note, impression and recommendations.   New painless Obs jaundice in pt with known PSC (followed at [facility]). Neg MRCP this AM for biliary ductal dil or liver lesions.  Atrophic L liver could be ischemic d/t prev  gastric Sx. CA 19-9 (7/5): 60 (N 0-35). No s/s ascending cholangitis. ?etiology of jaundice could be d/t DILI (recent cipro), small duct PSC, other etiology. No ETOH. No obvious cirrhosis but at risk.   pUC on entiyo q4weeky with good response.  Dose frequency was increased after last colon 09/2022 which showed mild colitis with low-grade dysplasia.   Remote partial gastrectomy d/t PUD (appears to be B1 on prev CT)   Recent UTI-treated with Cipro.  Plan: -Proceed with viral, metabolic, autoimmune liver WU. Check ANCA profile -Trend LFTs -Follow CA 19-9, AFP, CEA -Avoid all antibiotics including Cipro -If not better or LFTs worsen, would recommend transjugular IR liver bx with HWPG measurements to r/o pHTN. -FU at [facility] hepatology.  -Advance diet.   Edman Circle, MD [facility] [phone]

## 2023-03-26 NOTE — Progress Notes (Signed)
PROGRESS NOTE    Alex Thomas  ZOX:[id number] DOB: December 30, 1955 DOA: 03/24/2023 PCP: Benita Stabile, MD    Brief Narrative:   Alex Thomas is a 67 y.o. male with past medical history significant of for inflammatory bowel disease who presented to hospital with 2-5 bowel movements.  Patient receives Entyvio infusions every 4 weeks for the last 2 years.  Patient was in his usual state of health till approximately 4 weeks ago when an acquaintance noticed that the patient was having yellowing of eyes.  Since then patient has had slight progression of his eye as well as noticing darkening of his urine.  Patient was evaluated by PCP 3 days prior to this presentation.  Urine culture at that time showed UTI and was on ciprofloxacin.  Blood cultures done by PCP showed abnormal levels of bilirubin show was asked to come to the hospital.    Assessment and plan  Jaundice, history of primary sclerosing cholangitis Labs suggestive of cholestatic  disease with mild coagulopathy.  Received vitamin K.  On entyvio infusion every 4 weeks as requested.  Abdominal ultrasound of the right upper quadrant without any acute findings.  GI has been consulted and at this time plan is to proceed with MRI/MRCP.  Follows up with [facility] hepatology and [facility] gastroenterology as outpatient..  GI on board and multiple labs have been sent to assess for cholestatic disease including mitochondrial antibodies, CA 19-9, ANA, sports muscle antibody, hepatitis panel, ceruloplasmin, alpha-1 antitrypsin, ANCA profile, AFP.Marland Kitchen  Recent diagnosis of UTI (urinary tract infection) No urinary symptoms at this time.  Patient was ciprofloxacin outpatient which was continued during hospitalization to complete the course.  Hypokalemia.  Replenished.  Check BMP in AM.   CKD (chronic kidney disease) stage II. Seems to be chronic, with baseline creatinine creatinine fluctuating between 1 and 1.3.  Monitor BMP in AM.   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  History of peptic ulcer disease.  Status post partial gastric resection.  Continue PPI   DVT prophylaxis: SCDs Start: 03/24/23 2326   Code Status:     Code Status: Full Code  Disposition:  Home likely in 1 to 2 days when okay with GI.Marland Kitchen  Status is: Inpatient  Remains inpatient appropriate because: Workup for jaundice, GI consultation   Family Communication: None at bedside  Consultants:  GI  Procedures:  None yet  Antimicrobials:  Ciprofloxacin- completed  Anti-infectives (From admission, onward)    Start     Dose/Rate Route Frequency Ordered Stop   03/25/23 0015  ciprofloxacin (CIPRO) IVPB 400 mg        400 mg 200 mL/hr over 60 Minutes Intravenous Every 12 hours 03/24/23 2325 03/25/23 2342      Subjective: Today, patient was seen and examined at bedside.  Patient denies any nausea vomiting fever chills or rigor.    Objective: Vitals:   03/25/23 1633 03/25/23 2217 03/26/23 0243 03/26/23 0857  BP: 126/69 130/68 125/68 113/64  Pulse: 67 69 74 65  Resp: 18 19 18 18   Temp: 98 F (36.7 C) 98.2 F (36.8 C) 98.9 F (37.2 C) 98.4 F (36.9 C)  TempSrc: Oral Oral Oral   SpO2: 100% 99% 99% 97%  Weight:      Height:        Intake/Output Summary (Last 24 hours) at 03/26/2023 1048 Last data filed at 03/26/2023 0956 Gross per 24 hour  Intake 1497.14 ml  Output 6265 ml  Net -4767.86 ml   Filed Weights   03/24/23 2332  Weight: (!) 137.6 kg    Physical Examination: Body mass index is 37.92 kg/m.   General: Obese built, not in obvious distress HENT:   Icterus noted.  Oral mucosa is moist.  Chest:  Clear breath sounds.  No crackles or wheezes.  CVS: S1 &S2 heard. No murmur.  Regular rate and rhythm. Abdomen: Soft, nontender, nondistended.  Bowel sounds are heard.   Extremities: No cyanosis, clubbing or edema.  Peripheral pulses are palpable. Psych: Alert, awake and oriented, normal mood CNS:  No  cranial nerve deficits.  Power equal in all extremities.   Skin: Warm and dry.  No rashes noted.  Data Reviewed:   CBC: Recent Labs  Lab 03/24/23 1432 03/25/23 0507 03/26/23 0230  WBC 10.6* 10.1 11.4*  NEUTROABS  --  6.7  --   HGB 11.8* 10.6* 12.0*  HCT 37.5* 31.3* 36.3*  MCV 92.8 88.4 89.9  PLT 283 256 337    Basic Metabolic Panel: Recent Labs  Lab 03/24/23 1432 03/25/23 0507 03/26/23 0230  NA 137 135 134*  K 3.9 3.0* 3.3*  CL 102 106 101  CO2 21* 22 23  GLUCOSE 89 98 85  BUN 8 9 8   CREATININE 1.31* 1.07 1.18  CALCIUM 8.7* 8.3* 8.4*  MG  --   --  1.7    Liver Function Tests: Recent Labs  Lab 03/24/23 1432 03/24/23 1747 03/25/23 0507 03/26/23 0230  AST 178*  --  153* 160*  ALT 127*  --  114* 120*  ALKPHOS 651*  --  593* 623*  BILITOT 14.1* 13.4* 11.8* 14.1*  PROT 6.9  --  6.4* 6.8  ALBUMIN 2.2*  --  1.8* 2.0*     Radiology Studies: MR ABDOMEN MRCP W WO CONTAST  Result Date: 03/26/2023 CLINICAL DATA:  67 year old male with history of jaundice. EXAM: MRI ABDOMEN WITHOUT AND WITH CONTRAST (INCLUDING MRCP) TECHNIQUE: Multiplanar multisequence MR imaging of the abdomen was performed both before and after the administration of intravenous contrast. Heavily T2-weighted images of the biliary and pancreatic ducts were obtained, and three-dimensional MRCP images were rendered by post processing. CONTRAST:  10mL GADAVIST GADOBUTROL 1 MMOL/ML IV SOLN COMPARISON:  Abdominal MRI 03/09/2023. Abdominal ultrasound 03/24/2023. FINDINGS: Comment: Portions of today's examination are limited by considerable patient respiratory motion. Lower chest: Unremarkable. Hepatobiliary: Atrophy of the left lobe of the liver again noted. No suspicious appearing hepatic lesions are noted. MRCP images are limited by considerable patient motion. With these limitations in mind, there is no significant intra or extrahepatic biliary ductal dilatation. Mild increased T2 signal intensity is noted  adjacent to the portal triads, suggesting periportal edema. Gallbladder is unremarkable in appearance. Pancreas: No pancreatic mass. No pancreatic ductal dilatation. No pancreatic or peripancreatic fluid collections or inflammatory changes. Spleen:  Unremarkable. Adrenals/Urinary Tract: Multiple small parapelvic cysts are noted in both kidneys (benign requiring no imaging follow-up). No aggressive appearing renal lesions are noted. Expansion of the renal sinus fat bilaterally, unusual, but similar to prior studies and presumably benign. There are multiple filling defects associated with the collecting systems of both kidneys, presumably nonobstructive calculi, measuring up to 1.3 cm in the lower pole collecting system of the right kidney and 1.1 cm in the lower pole collecting system of the left kidney, similar to remote prior CT examination 12/30/2021. No hydroureteronephrosis in the visualized portions of the abdomen. Bilateral adrenal glands are normal in appearance. Stomach/Bowel: Numerous areas of susceptibility artifact  are noted in the upper abdomen adjacent to the distal stomach related to surgical clips from prior partial gastrectomy. Visualized portions are otherwise unremarkable. Vascular/Lymphatic: No aneurysm identified in the visualized abdominal vasculature. No lymphadenopathy noted in the abdomen. Other: No significant volume of ascites noted in the visualized portions of the peritoneal cavity. Musculoskeletal: No aggressive appearing osseous lesions are noted in the visualized portions of the skeleton. IMPRESSION: 1. Mild periportal edema in the liver, which is a nonspecific finding that can be seen in the setting of hepatitis. Correlation with liver function tests is recommended. 2. No other acute findings to account for the patient's history of jaundice. Specifically, no choledocholithiasis or findings to suggest biliary obstruction. 3. Chronic atrophy of the left lobe of the liver, similar to  prior studies. 4. Numerous nonobstructive calculi in the collecting systems of both kidneys. 5. Additional incidental findings, as above. Electronically Signed   By: Trudie Reed M.D.   On: 03/26/2023 09:31   US Abdomen Limited RUQ (LIVER/GB)  Result Date: 03/24/2023 CLINICAL DATA:  Right upper quadrant abdominal pain EXAM: ULTRASOUND ABDOMEN LIMITED RIGHT UPPER QUADRANT COMPARISON:  Previous studies including the MRI none on 03/09/2023 FINDINGS: Gallbladder: No gallstones or wall thickening visualized. No sonographic Murphy sign noted by sonographer. Common bile duct: Diameter: 3.3 mm Liver: Left lobe of liver is not visualized consistent with history of atrophy. There is increased echogenicity in liver. No focal abnormalities are seen in visualized portions of liver. Portal vein is patent on color Doppler imaging with normal direction of blood flow towards the liver. Other: None. IMPRESSION: No acute findings are seen in right upper quadrant abdomen sonogram. Electronically Signed   By: Ernie Avena M.D.   On: 03/24/2023 17:40      LOS: 2 days   Joycelyn Das, MD [facility] Available via Epic secure chat 7am-7pm After these hours, please refer to coverage provider listed on [email or web address] 03/26/2023, 10:48 AM

## 2023-03-27 ENCOUNTER — Encounter (HOSPITAL_COMMUNITY): Payer: Medicare Other

## 2023-03-27 DIAGNOSIS — K51019 Ulcerative (chronic) pancolitis with unspecified complications: Secondary | ICD-10-CM | POA: Diagnosis not present

## 2023-03-27 DIAGNOSIS — K831 Obstruction of bile duct: Secondary | ICD-10-CM | POA: Diagnosis not present

## 2023-03-27 DIAGNOSIS — K5190 Ulcerative colitis, unspecified, without complications: Secondary | ICD-10-CM | POA: Diagnosis not present

## 2023-03-27 DIAGNOSIS — N182 Chronic kidney disease, stage 2 (mild): Secondary | ICD-10-CM | POA: Diagnosis not present

## 2023-03-27 DIAGNOSIS — R7989 Other specified abnormal findings of blood chemistry: Secondary | ICD-10-CM

## 2023-03-27 DIAGNOSIS — R17 Unspecified jaundice: Secondary | ICD-10-CM | POA: Diagnosis not present

## 2023-03-27 LAB — PROTIME-INR
INR: 1.2 (ref 0.8–1.2)
Prothrombin Time: 15.2 seconds (ref 11.4–15.2)

## 2023-03-27 LAB — COMPREHENSIVE METABOLIC PANEL
ALT: 118 U/L — ABNORMAL HIGH (ref 0–44)
AST: 174 U/L — ABNORMAL HIGH (ref 15–41)
Albumin: 1.8 g/dL — ABNORMAL LOW (ref 3.5–5.0)
Alkaline Phosphatase: 567 U/L — ABNORMAL HIGH (ref 38–126)
Anion gap: 7 (ref 5–15)
BUN: 10 mg/dL (ref 8–23)
CO2: 21 mmol/L — ABNORMAL LOW (ref 22–32)
Calcium: 8 mg/dL — ABNORMAL LOW (ref 8.9–10.3)
Chloride: 104 mmol/L (ref 98–111)
Creatinine, Ser: 1.26 mg/dL — ABNORMAL HIGH (ref 0.61–1.24)
GFR, Estimated: >60 mL/min (ref >60)
Glucose, Bld: 122 mg/dL — ABNORMAL HIGH (ref 70–99)
Potassium: 4.1 mmol/L (ref 3.5–5.1)
Sodium: 132 mmol/L — ABNORMAL LOW (ref 135–145)
Total Bilirubin: 14 mg/dL — ABNORMAL HIGH (ref 0.3–1.2)
Total Protein: 6.3 g/dL — ABNORMAL LOW (ref 6.5–8.1)

## 2023-03-27 LAB — CERULOPLASMIN: Ceruloplasmin: 35.7 mg/dL — ABNORMAL HIGH (ref 16.0–31.0)

## 2023-03-27 LAB — ANTI-SMOOTH MUSCLE ANTIBODY, IGG: F-Actin IgG: 8 Units (ref 0–19)

## 2023-03-27 LAB — ALPHA-1-ANTITRYPSIN: A-1 Antitrypsin, Ser: 249 mg/dL — ABNORMAL HIGH (ref 101–187)

## 2023-03-27 LAB — IGG: IgG (Immunoglobin G), Serum: 1118 mg/dL (ref 603–1613)

## 2023-03-27 LAB — AFP TUMOR MARKER: AFP, Serum, Tumor Marker: <1.8 ng/mL (ref 0.0–8.4)

## 2023-03-27 LAB — MITOCHONDRIAL ANTIBODIES
Mitochondrial M2 Ab, IgG: <20 Units (ref 0.0–20.0)
Mitochondrial M2 Ab, IgG: <20 Units (ref 0.0–20.0)

## 2023-03-27 LAB — ANA W/REFLEX IF POSITIVE: Anti Nuclear Antibody (ANA): NEGATIVE

## 2023-03-27 LAB — CANCER ANTIGEN 19-9: CA 19-9: 75 U/mL — ABNORMAL HIGH (ref 0–35)

## 2023-03-27 MED ORDER — URSODIOL 300 MG PO CAPS
300.0000 mg | ORAL_CAPSULE | Freq: Three times a day (TID) | ORAL | Status: DC
  Administered 2023-03-27 – 2023-03-28 (×3): 300 mg via ORAL
  Filled 2023-03-27 (×5): qty 1

## 2023-03-27 NOTE — TOC CM/SW Note (Addendum)
Transition of Care [facility]) - Inpatient Brief Assessment   Patient Details  Name: Alex Thomas MRN: [id number] Date of Birth: Jan 17, 1956  Transition of Care [facility]) CM/SW Contact:    Tom-Johnson, Hershal Coria, RN Phone Number: 03/27/2023, 3:08 PM   Clinical Narrative:  Patient presented to the ED from his PCP with abnormal levels of Bilirubin and Jaundiced skin. Admitted with Obstructive Jaundice, GI following.   Patient has hx of Primary Sclerosing Cholangitis (PSC) and being followed at [facility].   From home with wife and one son, has three children. Retired, independent with care ad able to drive self prior to admission.  Has a cane, walker , shower seat and grab bars at home. PCP is Benita Stabile, MD and uses [facility].  No TOC needs or recommendations noted at this time. CM will continue to follow as patient progresses with care towards discharge.        Transition of Care Asessment: Insurance and Status: Insurance coverage has been reviewed Patient has primary care physician: Yes Home environment has been reviewed: Yes Prior level of function:: Independent Prior/Current Home Services: No current home services Social Determinants of Health Reivew: SDOH reviewed no interventions necessary Readmission risk has been reviewed: Yes Transition of care needs: no transition of care needs at this time

## 2023-03-27 NOTE — Progress Notes (Addendum)
Daily Progress Note  DOA: 03/24/2023 Hospital Day: 4 Chief Complaint: jaundice   Attending physician's note   I have taken a history, reviewed the chart and examined the patient. I performed a substantive portion of this encounter, including complete performance of at least one of the key components, in conjunction with the APP. I agree with the APP's note, impression and recommendations.    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 and recent diagnosis of PSC with worsening LFT pattern consistent with PSC and possible drug-induced liver injury  INR trended down to normal 1.2, no sign of liver failure  MRCP negative for biliary obstruction LFT have plateaued, will hold off liver biopsy but may benefit if LFTs do not improve He will need close follow-up with hepatology clinic Will start Urso 300 mg 3 times daily to improve alk phos Continue Entyvio Avoid alcohol, soda, artificial sweeteners, herbal remedies or any hepatotoxins  If LFT remains stable, okay to discharge patient home tomorrow to follow-up with his outpatient hepatologist and GI team at [facility]   The patient was provided an opportunity to ask questions and all were answered. The patient agreed with the plan and demonstrated an understanding of the instructions.   [name] , MD [phone]     Assessment and Plan:    Brief Narrative:  Alex Thomas is a 67 y.o. year old male with a past medical history not limited to gastric ulcers s/ gastric recention,  kidney stones pan-ulcerative colitis diagnosed March 2023 and followed by [facility].  Recently diagnosed with PSC . 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 Diagnosed in March 2023.   New onset painless jaundice / recent diagnosis of PSC followed at [facility]. MRCP this admission showing periportal edema ( non-specific) , atrophic left lobe of the liver , no significant intra  or extrahepatic biliary ductal dilatation, no acute findings.  Etiology of new jaundice unclear. Drug induced liver injury d/t Cipro? Malignancy? -Liver enzymes stable - AST 174 / ALT 118. Tbili stable at 14. Alk phos stable in 560's.  -Hepatic serologic workup in progress. So far iron studies, ANA, Hep A IgM, Hep B surface ag,  HCV ab all negative.  -Awaiting IgG, Alpha-1 antitrypsin, ceruloplasmin, anti-smooth muscle ab, AFP -CA 19-9 mildly elevated at 60 ( 0-35) -May need transjugular IR liver bx with HWPG measurements if  liver tests do not improve.   Subjective / New Events:   Feels okay. No complaints   Objective:   Recent Labs    03/24/23 1432 03/25/23 0507 03/26/23 0230  WBC 10.6* 10.1 11.4*  HGB 11.8* 10.6* 12.0*  HCT 37.5* 31.3* 36.3*  PLT 283 256 337   BMET Recent Labs    03/25/23 0507 03/26/23 0230 03/27/23 0118  NA 135 134* 132*  K 3.0* 3.3* 4.1  CL 106 101 104  CO2 22 23 21*  GLUCOSE 98 85 122*  BUN 9 8 10   CREATININE 1.07 1.18 1.26*  CALCIUM 8.3* 8.4* 8.0*   LFT Recent Labs    03/24/23 1747 03/25/23 0507 03/27/23 0118  PROT  --    < > 6.3*  ALBUMIN  --    < > 1.8*  AST  --    < > 174*  ALT  --    < > 118*  ALKPHOS  --    < > 567*  BILITOT 13.4*   < > 14.0*  BILIDIR 8.3*  --   --  IBILI 5.1*  --   --    < > = values in this interval not displayed.   PT/INR Recent Labs    03/26/23 1146 03/27/23 0118  LABPROT 16.1* 15.2  INR 1.3* 1.2     Imaging:  MR ABDOMEN MRCP W WO CONTAST CLINICAL DATA:  67 year old male with history of jaundice.  EXAM: MRI ABDOMEN WITHOUT AND WITH CONTRAST (INCLUDING MRCP)  TECHNIQUE: Multiplanar multisequence MR imaging of the abdomen was performed both before and after the administration of intravenous contrast. Heavily T2-weighted images of the biliary and pancreatic ducts were obtained, and three-dimensional MRCP images were rendered by post processing.  CONTRAST:  10mL GADAVIST GADOBUTROL 1 MMOL/ML  IV SOLN  COMPARISON:  Abdominal MRI 03/09/2023. Abdominal ultrasound 03/24/2023.  FINDINGS: Comment: Portions of today's examination are limited by considerable patient respiratory motion.  Lower chest: Unremarkable.  Hepatobiliary: Atrophy of the left lobe of the liver again noted. No suspicious appearing hepatic lesions are noted. MRCP images are limited by considerable patient motion. With these limitations in mind, there is no significant intra or extrahepatic biliary ductal dilatation. Mild increased T2 signal intensity is noted adjacent to the portal triads, suggesting periportal edema. Gallbladder is unremarkable in appearance.  Pancreas: No pancreatic mass. No pancreatic ductal dilatation. No pancreatic or peripancreatic fluid collections or inflammatory changes.  Spleen:  Unremarkable.  Adrenals/Urinary Tract: Multiple small parapelvic cysts are noted in both kidneys (benign requiring no imaging follow-up). No aggressive appearing renal lesions are noted. Expansion of the renal sinus fat bilaterally, unusual, but similar to prior studies and presumably benign. There are multiple filling defects associated with the collecting systems of both kidneys, presumably nonobstructive calculi, measuring up to 1.3 cm in the lower pole collecting system of the right kidney and 1.1 cm in the lower pole collecting system of the left kidney, similar to remote prior CT examination 12/30/2021. No hydroureteronephrosis in the visualized portions of the abdomen. Bilateral adrenal glands are normal in appearance.  Stomach/Bowel: Numerous areas of susceptibility artifact are noted in the upper abdomen adjacent to the distal stomach related to surgical clips from prior partial gastrectomy. Visualized portions are otherwise unremarkable.  Vascular/Lymphatic: No aneurysm identified in the visualized abdominal vasculature. No lymphadenopathy noted in the abdomen.  Other: No significant  volume of ascites noted in the visualized portions of the peritoneal cavity.  Musculoskeletal: No aggressive appearing osseous lesions are noted in the visualized portions of the skeleton.  IMPRESSION: 1. Mild periportal edema in the liver, which is a nonspecific finding that can be seen in the setting of hepatitis. Correlation with liver function tests is recommended. 2. No other acute findings to account for the patient's history of jaundice. Specifically, no choledocholithiasis or findings to suggest biliary obstruction. 3. Chronic atrophy of the left lobe of the liver, similar to prior studies. 4. Numerous nonobstructive calculi in the collecting systems of both kidneys. 5. Additional incidental findings, as above.  Electronically Signed   By: Trudie Reed M.D.   On: 03/26/2023 09:31     Scheduled inpatient medications:   acidophilus  1 capsule Oral Daily   cholecalciferol  1,000 Units Oral Daily   cyanocobalamin  2,000 mcg Oral Daily   folic acid  1 mg Oral Daily   pantoprazole  40 mg Oral Daily   sodium chloride flush  3 mL Intravenous Q12H   Continuous inpatient infusions:  PRN inpatient medications:   Vital signs in last 24 hours: Temp:  [97.6 F (36.4 C)-98.4 F (36.9  C)] 97.9 F (36.6 C) (07/15 0818) Pulse Rate:  [60-78] 78 (07/15 0818) Resp:  [16-18] 16 (07/15 0818) BP: (106-127)/(63-73) 106/63 (07/15 0818) SpO2:  [95 %-100 %] 97 % (07/15 0818) Last BM Date : 03/26/23  Intake/Output Summary (Last 24 hours) at 03/27/2023 1413 Last data filed at 03/27/2023 0855 Gross per 24 hour  Intake 290.1 ml  Output 1801 ml  Net -1510.9 ml    Intake/Output from previous day: 07/14 0701 - 07/15 0700 In: 530.1 [P.O.:480; IV Piggyback:50.1] Out: 2325 [Urine:2325] Intake/Output this shift: Total I/O In: 240 [P.O.:240] Out: 301 [Urine:300; Stool:1]   Physical Exam:  General: Alert obese male in NAD Heart:  Regular rate and rhythm.  Pulmonary: Normal  respiratory effort Abdomen: Soft, nondistended, nontender. Normal bowel sounds. Extremities: No lower extremity edema  Neurologic: Alert and oriented Psych: Pleasant. Cooperative. Insight appears normal.    Principal Problem:   Obstructive jaundice Active Problems:   Ulcerative colitis (HCC)   CKD (chronic kidney disease)   UTI (urinary tract infection)   Jaundice     LOS: 3 days   Willette Cluster ,NP 03/27/2023, 2:13 PM

## 2023-03-27 NOTE — Plan of Care (Signed)

## 2023-03-27 NOTE — Progress Notes (Signed)
PROGRESS NOTE    Alex Thomas  UEA:[id number] DOB: 1956-07-03 DOA: 03/24/2023 PCP: Benita Stabile, MD    Brief Narrative:   Alex Thomas is a 67 y.o. male with past medical history significant of for inflammatory bowel disease who presented to hospital with 2-5 bowel movements.  Patient receives Entyvio infusions every 4 weeks for the last 2 years.  Patient was in his usual state of health till approximately 4 weeks ago when an acquaintance noticed that the patient was having yellowing of eyes.  Since then patient has had slight progression of his eye as well as noticing darkening of his urine.  Patient was evaluated by PCP 3 days prior to this presentation.  Urine culture at that time showed UTI and was on ciprofloxacin.  Blood cultures done by PCP showed abnormal levels of bilirubin show was asked to come to the hospital.    During hospitalization, patient has been seen by GI for abnormal LFTs jaundice and previous history of sclerosing cholangitis.  MRCP was done with some periportal edema.  At this time multiple labs have been sent to assess for liver injury.  GI following.  Assessment and plan  Jaundice, history of primary sclerosing cholangitis Labs suggestive of cholestatic  disease with mild coagulopathy.   On entyvio infusion every 4 weeks as outpatient  Abdominal ultrasound of the right upper quadrant without any acute findings.  GI on board and had MRI/MRCP with periportal edema, chronic atrophy of left lobe of liver Previous findings.  Does follow up with [facility] hepatology and [facility] gastroenterology as outpatient..  GI on board and multiple labs have been sent to assess for liver disease including mitochondrial antibodies, CA 19-9, ANA, sports muscle antibody, hepatitis panel, ceruloplasmin, alpha-1 antitrypsin, ANCA profile, AFP patient still has a significantly elevated bilirubin at 14 with elevated ALT at 118 and AST of 174.  Hepatitis panel was however negative.  Latest INR of 1.3.   Ciprofloxacin has been discontinued due to concerns for cholestasis..  Recent diagnosis of UTI (urinary tract infection) No urinary symptoms at this time.  Patient was ciprofloxacin outpatient this will be discontinued at this time due to concerns for cholestasis  Hypokalemia.  Replenished.  Potassium today at 4.1 from 3.3 yesterday.   CKD (chronic kidney disease) stage II. Seems to be chronic, with baseline creatinine creatinine fluctuating between 1 and 1.3.  Creatinine today at 1.2.   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  Follow GI input.  History of peptic ulcer disease.  Status post partial gastric resection.  Continue PPI   DVT prophylaxis: SCDs Start: 03/24/23 2326   Code Status:     Code Status: Full Code  Disposition:  Home likely in 1 to 2 days when okay with GI.Marland Kitchen  Status is: Inpatient  Remains inpatient appropriate because: Workup for jaundice, GI follow-up,   Family Communication: None at bedside  Consultants:  GI  Procedures:  None yet  Antimicrobials:  None currently  Anti-infectives (From admission, onward)    Start     Dose/Rate Route Frequency Ordered Stop   03/25/23 0015  ciprofloxacin (CIPRO) IVPB 400 mg        400 mg 200 mL/hr over 60 Minutes Intravenous Every 12 hours 03/24/23 2325 03/25/23 2342      Subjective: Today, patient was seen and examined at bedside.  Patient denies any nausea vomiting fever chills or rigor.  He otherwise feels okay.  Still has jaundice.  States that after drinking more  water his urine seems to be clearing better.    Objective: Vitals:   03/26/23 1624 03/26/23 2115 03/27/23 0555 03/27/23 0818  BP: 120/73 127/71 121/65 106/63  Pulse: 60 72 68 78  Resp: 18 18 18 16   Temp: 97.6 F (36.4 C) 98.4 F (36.9 C) 98.3 F (36.8 C) 97.9 F (36.6 C)  TempSrc: Oral Oral Oral   SpO2: 100% 100% 95% 97%  Weight:      Height:        Intake/Output Summary (Last 24 hours) at 03/27/2023 1332 Last data  filed at 03/27/2023 0855 Gross per 24 hour  Intake 290.1 ml  Output 1801 ml  Net -1510.9 ml   Filed Weights   03/24/23 2332  Weight: (!) 137.6 kg    Physical Examination: Body mass index is 37.92 kg/m.   General: Obese built, not in obvious distress HENT:   Icterus noted,  Oral mucosa is moist.  Chest:  Clear breath sounds.  No crackles or wheezes.  CVS: S1 &S2 heard. No murmur.  Regular rate and rhythm. Abdomen: Soft, nontender, nondistended.  Bowel sounds are heard.   Extremities: No cyanosis, clubbing or edema.  Peripheral pulses are palpable. Psych: Alert, awake and oriented, normal mood CNS:  No cranial nerve deficits.  Power equal in all extremities.   Skin: Warm and dry.  No rashes noted.  Data Reviewed:   CBC: Recent Labs  Lab 03/24/23 1432 03/25/23 0507 03/26/23 0230  WBC 10.6* 10.1 11.4*  NEUTROABS  --  6.7  --   HGB 11.8* 10.6* 12.0*  HCT 37.5* 31.3* 36.3*  MCV 92.8 88.4 89.9  PLT 283 256 337    Basic Metabolic Panel: Recent Labs  Lab 03/24/23 1432 03/25/23 0507 03/26/23 0230 03/27/23 0118  NA 137 135 134* 132*  K 3.9 3.0* 3.3* 4.1  CL 102 106 101 104  CO2 21* 22 23 21*  GLUCOSE 89 98 85 122*  BUN 8 9 8 10   CREATININE 1.31* 1.07 1.18 1.26*  CALCIUM 8.7* 8.3* 8.4* 8.0*  MG  --   --  1.7  --     Liver Function Tests: Recent Labs  Lab 03/24/23 1432 03/24/23 1747 03/25/23 0507 03/26/23 0230 03/27/23 0118  AST 178*  --  153* 160* 174*  ALT 127*  --  114* 120* 118*  ALKPHOS 651*  --  593* 623* 567*  BILITOT 14.1* 13.4* 11.8* 14.1* 14.0*  PROT 6.9  --  6.4* 6.8 6.3*  ALBUMIN 2.2*  --  1.8* 2.0* 1.8*     Radiology Studies: MR ABDOMEN MRCP W WO CONTAST  Result Date: 03/26/2023 CLINICAL DATA:  67 year old male with history of jaundice. EXAM: MRI ABDOMEN WITHOUT AND WITH CONTRAST (INCLUDING MRCP) TECHNIQUE: Multiplanar multisequence MR imaging of the abdomen was performed both before and after the administration of intravenous contrast.  Heavily T2-weighted images of the biliary and pancreatic ducts were obtained, and three-dimensional MRCP images were rendered by post processing. CONTRAST:  10mL GADAVIST GADOBUTROL 1 MMOL/ML IV SOLN COMPARISON:  Abdominal MRI 03/09/2023. Abdominal ultrasound 03/24/2023. FINDINGS: Comment: Portions of today's examination are limited by considerable patient respiratory motion. Lower chest: Unremarkable. Hepatobiliary: Atrophy of the left lobe of the liver again noted. No suspicious appearing hepatic lesions are noted. MRCP images are limited by considerable patient motion. With these limitations in mind, there is no significant intra or extrahepatic biliary ductal dilatation. Mild increased T2 signal intensity is noted adjacent to the portal triads, suggesting periportal edema. Gallbladder is unremarkable  in appearance. Pancreas: No pancreatic mass. No pancreatic ductal dilatation. No pancreatic or peripancreatic fluid collections or inflammatory changes. Spleen:  Unremarkable. Adrenals/Urinary Tract: Multiple small parapelvic cysts are noted in both kidneys (benign requiring no imaging follow-up). No aggressive appearing renal lesions are noted. Expansion of the renal sinus fat bilaterally, unusual, but similar to prior studies and presumably benign. There are multiple filling defects associated with the collecting systems of both kidneys, presumably nonobstructive calculi, measuring up to 1.3 cm in the lower pole collecting system of the right kidney and 1.1 cm in the lower pole collecting system of the left kidney, similar to remote prior CT examination 12/30/2021. No hydroureteronephrosis in the visualized portions of the abdomen. Bilateral adrenal glands are normal in appearance. Stomach/Bowel: Numerous areas of susceptibility artifact are noted in the upper abdomen adjacent to the distal stomach related to surgical clips from prior partial gastrectomy. Visualized portions are otherwise unremarkable.  Vascular/Lymphatic: No aneurysm identified in the visualized abdominal vasculature. No lymphadenopathy noted in the abdomen. Other: No significant volume of ascites noted in the visualized portions of the peritoneal cavity. Musculoskeletal: No aggressive appearing osseous lesions are noted in the visualized portions of the skeleton. IMPRESSION: 1. Mild periportal edema in the liver, which is a nonspecific finding that can be seen in the setting of hepatitis. Correlation with liver function tests is recommended. 2. No other acute findings to account for the patient's history of jaundice. Specifically, no choledocholithiasis or findings to suggest biliary obstruction. 3. Chronic atrophy of the left lobe of the liver, similar to prior studies. 4. Numerous nonobstructive calculi in the collecting systems of both kidneys. 5. Additional incidental findings, as above. Electronically Signed   By: Trudie Reed M.D.   On: 03/26/2023 09:31      LOS: 3 days   Joycelyn Das, MD [facility] Available via Epic secure chat 7am-7pm After these hours, please refer to coverage provider listed on [email or web address] 03/27/2023, 1:32 PM

## 2023-03-28 ENCOUNTER — Encounter (HOSPITAL_COMMUNITY): Payer: Self-pay | Admitting: Gastroenterology

## 2023-03-28 ENCOUNTER — Other Ambulatory Visit (HOSPITAL_COMMUNITY): Payer: Self-pay

## 2023-03-28 DIAGNOSIS — K831 Obstruction of bile duct: Secondary | ICD-10-CM | POA: Diagnosis not present

## 2023-03-28 LAB — COMPREHENSIVE METABOLIC PANEL
ALT: 120 U/L — ABNORMAL HIGH (ref 0–44)
AST: 166 U/L — ABNORMAL HIGH (ref 15–41)
Albumin: 1.8 g/dL — ABNORMAL LOW (ref 3.5–5.0)
Alkaline Phosphatase: 629 U/L — ABNORMAL HIGH (ref 38–126)
Anion gap: 9 (ref 5–15)
BUN: 10 mg/dL (ref 8–23)
CO2: 23 mmol/L (ref 22–32)
Calcium: 8.2 mg/dL — ABNORMAL LOW (ref 8.9–10.3)
Chloride: 102 mmol/L (ref 98–111)
Creatinine, Ser: 1.21 mg/dL (ref 0.61–1.24)
GFR, Estimated: >60 mL/min (ref >60)
Glucose, Bld: 107 mg/dL — ABNORMAL HIGH (ref 70–99)
Potassium: 3.6 mmol/L (ref 3.5–5.1)
Sodium: 134 mmol/L — ABNORMAL LOW (ref 135–145)
Total Bilirubin: 13.8 mg/dL — ABNORMAL HIGH (ref 0.3–1.2)
Total Protein: 6.3 g/dL — ABNORMAL LOW (ref 6.5–8.1)

## 2023-03-28 LAB — CBC
HCT: 30.6 % — ABNORMAL LOW (ref 39.0–52.0)
Hemoglobin: 10.2 g/dL — ABNORMAL LOW (ref 13.0–17.0)
MCH: 29.1 pg (ref 26.0–34.0)
MCHC: 33.3 g/dL (ref 30.0–36.0)
MCV: 87.4 fL (ref 80.0–100.0)
Platelets: 326 10*3/uL (ref 150–400)
RBC: 3.5 MIL/uL — ABNORMAL LOW (ref 4.22–5.81)
RDW: 16.7 % — ABNORMAL HIGH (ref 11.5–15.5)
WBC: 13 10*3/uL — ABNORMAL HIGH (ref 4.0–10.5)
nRBC: 0 % (ref 0.0–0.2)

## 2023-03-28 LAB — PROTIME-INR
INR: 1.2 (ref 0.8–1.2)
Prothrombin Time: 15.3 seconds — ABNORMAL HIGH (ref 11.4–15.2)

## 2023-03-28 LAB — CEA: CEA: 5.1 ng/mL — ABNORMAL HIGH (ref 0.0–4.7)

## 2023-03-28 LAB — MAGNESIUM: Magnesium: 1.6 mg/dL — ABNORMAL LOW (ref 1.7–2.4)

## 2023-03-28 MED ORDER — POTASSIUM CHLORIDE CRYS ER 20 MEQ PO TBCR
40.0000 meq | EXTENDED_RELEASE_TABLET | Freq: Once | ORAL | Status: AC
  Administered 2023-03-28: 40 meq via ORAL
  Filled 2023-03-28: qty 2

## 2023-03-28 MED ORDER — MAGNESIUM SULFATE 2 GM/50ML IV SOLN
2.0000 g | Freq: Once | INTRAVENOUS | Status: AC
  Administered 2023-03-28: 2 g via INTRAVENOUS
  Filled 2023-03-28: qty 50

## 2023-03-28 MED ORDER — URSODIOL 300 MG PO CAPS
300.0000 mg | ORAL_CAPSULE | Freq: Three times a day (TID) | ORAL | 0 refills | Status: AC
  Filled 2023-03-28: qty 90, 30d supply, fill #0

## 2023-03-28 NOTE — Progress Notes (Signed)
Daily Progress Note  DOA: 03/24/2023 Hospital Day: 5 Chief Complaint: jaundice   Assessment and Plan:    Brief Narrative:  Alex Thomas is a 67 y.o. 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  Recently diagnosed with PSC . 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 Diagnosed in March 2023.    New onset painless jaundice / recent diagnosis of PSC followed at [facility]. MRCP this admission showing periportal edema ( non-specific) , atrophic left lobe of the liver , no significant intra or extrahepatic biliary ductal dilatation, no acute findings.  Etiology of new jaundice unclear. Drug induced liver injury d/t Cipro? Related to PSC? -Liver enzymes stable , not much change overnight.  AST 166 / ALT 120. Tbili 13.8, alk phos 629.  -Hepatic serologic workup negative for other causes of jaundice is negative.  -CA 19-9 mildly elevated at 60 ( 0-35) -Continue Urso ( started yesterday) -Stable for discharge from GI standpoint. He will follow up with his primary GI, Dr. Levon Hedger in near future. Should have repeat labs in about a week ( I sent Dr. Levon Hedger a message to update him on patient)    Subjective / New Events:   No complaints. About to go home   Objective:   Recent Labs    03/26/23 0230 03/28/23 0113  WBC 11.4* 13.0*  HGB 12.0* 10.2*  HCT 36.3* 30.6*  PLT 337 326   BMET Recent Labs    03/26/23 0230 03/27/23 0118 03/28/23 0113  NA 134* 132* 134*  K 3.3* 4.1 3.6  CL 101 104 102  CO2 23 21* 23  GLUCOSE 85 122* 107*  BUN 8 10 10   CREATININE 1.18 1.26* 1.21  CALCIUM 8.4* 8.0* 8.2*   LFT Recent Labs    03/28/23 0113  PROT 6.3*  ALBUMIN 1.8*  AST 166*  ALT 120*  ALKPHOS 629*  BILITOT 13.8*   PT/INR Recent Labs    03/27/23 0118 03/28/23 0113  LABPROT 15.2 15.3*  INR 1.2 1.2      Imaging:  MR ABDOMEN MRCP W WO CONTAST CLINICAL DATA:  67 year old male with history of jaundice.  EXAM: MRI ABDOMEN WITHOUT AND WITH CONTRAST (INCLUDING MRCP)  TECHNIQUE: Multiplanar multisequence MR imaging of the abdomen was performed both before and after the administration of intravenous contrast. Heavily T2-weighted images of the biliary and pancreatic ducts were obtained, and three-dimensional MRCP images were rendered by post processing.  CONTRAST:  10mL GADAVIST GADOBUTROL 1 MMOL/ML IV SOLN  COMPARISON:  Abdominal MRI 03/09/2023. Abdominal ultrasound 03/24/2023.  FINDINGS: Comment: Portions of today's examination are limited by considerable patient respiratory motion.  Lower chest: Unremarkable.  Hepatobiliary: Atrophy of the left lobe of the liver again noted. No suspicious appearing hepatic lesions are noted. MRCP images are limited by considerable patient motion. With these limitations in mind, there is no significant intra or extrahepatic biliary ductal dilatation. Mild increased T2 signal intensity is noted adjacent to the portal triads, suggesting periportal edema. Gallbladder is unremarkable in appearance.  Pancreas: No pancreatic mass. No pancreatic ductal dilatation. No pancreatic or peripancreatic fluid collections or inflammatory changes.  Spleen:  Unremarkable.  Adrenals/Urinary Tract: Multiple small parapelvic cysts are noted in both kidneys (benign requiring no imaging follow-up). No aggressive appearing renal lesions are noted. Expansion of the renal sinus fat bilaterally, unusual, but similar to  prior studies and presumably benign. There are multiple filling defects associated with the collecting systems of both kidneys, presumably nonobstructive calculi, measuring up to 1.3 cm in the lower pole collecting system of the right kidney and 1.1 cm in the lower pole collecting system of the left kidney, similar to remote prior CT  examination 12/30/2021. No hydroureteronephrosis in the visualized portions of the abdomen. Bilateral adrenal glands are normal in appearance.  Stomach/Bowel: Numerous areas of susceptibility artifact are noted in the upper abdomen adjacent to the distal stomach related to surgical clips from prior partial gastrectomy. Visualized portions are otherwise unremarkable.  Vascular/Lymphatic: No aneurysm identified in the visualized abdominal vasculature. No lymphadenopathy noted in the abdomen.  Other: No significant volume of ascites noted in the visualized portions of the peritoneal cavity.  Musculoskeletal: No aggressive appearing osseous lesions are noted in the visualized portions of the skeleton.  IMPRESSION: 1. Mild periportal edema in the liver, which is a nonspecific finding that can be seen in the setting of hepatitis. Correlation with liver function tests is recommended. 2. No other acute findings to account for the patient's history of jaundice. Specifically, no choledocholithiasis or findings to suggest biliary obstruction. 3. Chronic atrophy of the left lobe of the liver, similar to prior studies. 4. Numerous nonobstructive calculi in the collecting systems of both kidneys. 5. Additional incidental findings, as above.  Electronically Signed   By: Trudie Reed M.D.   On: 03/26/2023 09:31     Scheduled inpatient medications:   cholecalciferol  1,000 Units Oral Daily   cyanocobalamin  2,000 mcg Oral Daily   folic acid  1 mg Oral Daily   pantoprazole  40 mg Oral Daily   sodium chloride flush  3 mL Intravenous Q12H   ursodiol  300 mg Oral TID   Continuous inpatient infusions:  PRN inpatient medications:   Vital signs in last 24 hours: Temp:  [97.8 F (36.6 C)-98.6 F (37 C)] 98.6 F (37 C) (07/16 0930) Pulse Rate:  [68-76] 69 (07/16 0930) Resp:  [18-19] 18 (07/16 0930) BP: (103-123)/(65-81) 103/81 (07/16 0930) SpO2:  [95 %-99 %] 95 % (07/16 0930) Last  BM Date : 03/28/23  Intake/Output Summary (Last 24 hours) at 03/28/2023 1337 Last data filed at 03/28/2023 0900 Gross per 24 hour  Intake 483 ml  Output 1250 ml  Net -767 ml    Intake/Output from previous day: 07/15 0701 - 07/16 0700 In: 720 [P.O.:720] Out: 1751 [Urine:1750; Stool:1] Intake/Output this shift: Total I/O In: 243 [P.O.:240; I.V.:3] Out: -    Physical Exam:  General: Alert male in NAD Heart:  Regular rate and rhythm.  Pulmonary: Normal respiratory effort Abdomen: Soft, nondistended, nontender. Normal bowel sounds. Extremities: No lower extremity edema  Neurologic: Alert and oriented Psych: Pleasant. Cooperative. Insight appears normal.    Principal Problem:   Obstructive jaundice Active Problems:   Ulcerative colitis (HCC)   CKD (chronic kidney disease)   UTI (urinary tract infection)   Jaundice   Abnormal LFTs     LOS: 4 days   Willette Cluster ,NP 03/28/2023, 1:37 PM

## 2023-03-28 NOTE — Discharge Summary (Signed)
[facility]  Physician Discharge Summary   Patient ID: Alex Thomas MRN: [id number] DOB/AGE: 02/01/1956 67 y.o.  Admit date: 03/24/2023 Discharge date: 03/28/2023    PCP: Benita Stabile, MD  DISCHARGE DIAGNOSES:    Obstructive jaundice   Ulcerative colitis (HCC)   CKD (chronic kidney disease)   UTI (urinary tract infection)   Abnormal LFTs   RECOMMENDATIONS FOR OUTPATIENT FOLLOW UP: Patient knows to follow-up with PCP for repeat blood work in 1 week. He has an appointment with his hepatologist at [facility] in 2 weeks   Home Health: None Equipment/Devices: None  CODE STATUS: Full code  DISCHARGE CONDITION: fair  Diet recommendation: As before  INITIAL HISTORY: 67 y.o. male with past medical history significant of for inflammatory bowel disease who presented to hospital with 2-5 bowel movements.  Patient receives Entyvio infusions every 4 weeks for the last 2 years.  Patient was in his usual state of health till approximately 4 weeks ago when an acquaintance noticed that the patient was having yellowing of eyes.  Since then patient has had slight progression of his eye as well as noticing darkening of his urine.  Patient was evaluated by PCP 3 days prior to this presentation.  Urine culture at that time showed UTI and was on ciprofloxacin.  Blood cultures done by PCP showed abnormal levels of bilirubin show was asked to come to the hospital.     During hospitalization, patient has been seen by GI for abnormal LFTs jaundice and previous history of sclerosing cholangitis.  MRCP was done with some periportal edema.      HOSPITAL COURSE:   Jaundice/History of primary sclerosing cholangitis Labs suggestive of cholestatic  disease with mild coagulopathy.   On entyvio infusion every 4 weeks as outpatient  Abdominal ultrasound of the right upper quadrant without any acute findings.  GI on board and had MRI/MRCP with periportal edema, chronic atrophy of left lobe of liver Previous  findings.  Does follow up with [facility] hepatology and [facility] gastroenterology as outpatient..  GI on board and multiple labs have been sent to assess for liver disease including mitochondrial antibodies, CA 19-9, ANA, sports muscle antibody, hepatitis panel, ceruloplasmin, alpha-1 antitrypsin, ANCA profile, AFP.  This will be followed up by gastroenterology.  Hepatitis panel was unremarkable. Patient was started on ursodiol which will be prescribed at discharge.  Cipro has been discontinued.  Bilirubin is elevated but stable.  Cleared for discharge by gastroenterology.    Recent diagnosis of UTI (urinary tract infection) No urinary symptoms at this time.  Patient was ciprofloxacin outpatient this will be discontinued at this time due to concerns for cholestasis   Hypokalemia/hypomagnesemia Replenished.    CKD (chronic kidney disease) stage II. Seems to be chronic, with baseline creatinine creatinine fluctuating between 1 and 1.3.     Ulcerative pancolitis on Entyvio infusion as outpatient.  Follow GI input.   History of peptic ulcer disease.   Status post partial gastric resection.   Obesity Estimated body mass index is 37.92 kg/m as calculated from the following:   Height as of this encounter: 6\' 3"  (1.905 m).   Weight as of this encounter: 137.6 kg.   Patient is stable.  Okay for discharge home today.  PERTINENT LABS:  The results of significant diagnostics from this hospitalization (including imaging, microbiology, ancillary and laboratory) are listed below for reference.     Labs:   Basic Metabolic Panel: Recent Labs  Lab 03/24/23 1432 03/25/23 0507 03/26/23 0230 03/27/23 0118  03/28/23 0113  NA 137 135 134* 132* 134*  K 3.9 3.0* 3.3* 4.1 3.6  CL 102 106 101 104 102  CO2 21* 22 23 21* 23  GLUCOSE 89 98 85 122* 107*  BUN 8 9 8 10 10   CREATININE 1.31* 1.07 1.18 1.26* 1.21  CALCIUM 8.7* 8.3* 8.4* 8.0* 8.2*  MG  --   --  1.7  --  1.6*   Liver Function  Tests: Recent Labs  Lab 03/24/23 1432 03/24/23 1747 03/25/23 0507 03/26/23 0230 03/27/23 0118 03/28/23 0113  AST 178*  --  153* 160* 174* 166*  ALT 127*  --  114* 120* 118* 120*  ALKPHOS 651*  --  593* 623* 567* 629*  BILITOT 14.1* 13.4* 11.8* 14.1* 14.0* 13.8*  PROT 6.9  --  6.4* 6.8 6.3* 6.3*  ALBUMIN 2.2*  --  1.8* 2.0* 1.8* 1.8*   Recent Labs  Lab 03/24/23 1432  LIPASE 21    CBC: Recent Labs  Lab 03/24/23 1432 03/25/23 0507 03/26/23 0230 03/28/23 0113  WBC 10.6* 10.1 11.4* 13.0*  NEUTROABS  --  6.7  --   --   HGB 11.8* 10.6* 12.0* 10.2*  HCT 37.5* 31.3* 36.3* 30.6*  MCV 92.8 88.4 89.9 87.4  PLT 283 256 337 326      IMAGING STUDIES MR ABDOMEN MRCP W WO CONTAST  Result Date: 03/26/2023 CLINICAL DATA:  67 year old male with history of jaundice. EXAM: MRI ABDOMEN WITHOUT AND WITH CONTRAST (INCLUDING MRCP) TECHNIQUE: Multiplanar multisequence MR imaging of the abdomen was performed both before and after the administration of intravenous contrast. Heavily T2-weighted images of the biliary and pancreatic ducts were obtained, and three-dimensional MRCP images were rendered by post processing. CONTRAST:  10mL GADAVIST GADOBUTROL 1 MMOL/ML IV SOLN COMPARISON:  Abdominal MRI 03/09/2023. Abdominal ultrasound 03/24/2023. FINDINGS: Comment: Portions of today's examination are limited by considerable patient respiratory motion. Lower chest: Unremarkable. Hepatobiliary: Atrophy of the left lobe of the liver again noted. No suspicious appearing hepatic lesions are noted. MRCP images are limited by considerable patient motion. With these limitations in mind, there is no significant intra or extrahepatic biliary ductal dilatation. Mild increased T2 signal intensity is noted adjacent to the portal triads, suggesting periportal edema. Gallbladder is unremarkable in appearance. Pancreas: No pancreatic mass. No pancreatic ductal dilatation. No pancreatic or peripancreatic fluid collections or  inflammatory changes. Spleen:  Unremarkable. Adrenals/Urinary Tract: Multiple small parapelvic cysts are noted in both kidneys (benign requiring no imaging follow-up). No aggressive appearing renal lesions are noted. Expansion of the renal sinus fat bilaterally, unusual, but similar to prior studies and presumably benign. There are multiple filling defects associated with the collecting systems of both kidneys, presumably nonobstructive calculi, measuring up to 1.3 cm in the lower pole collecting system of the right kidney and 1.1 cm in the lower pole collecting system of the left kidney, similar to remote prior CT examination 12/30/2021. No hydroureteronephrosis in the visualized portions of the abdomen. Bilateral adrenal glands are normal in appearance. Stomach/Bowel: Numerous areas of susceptibility artifact are noted in the upper abdomen adjacent to the distal stomach related to surgical clips from prior partial gastrectomy. Visualized portions are otherwise unremarkable. Vascular/Lymphatic: No aneurysm identified in the visualized abdominal vasculature. No lymphadenopathy noted in the abdomen. Other: No significant volume of ascites noted in the visualized portions of the peritoneal cavity. Musculoskeletal: No aggressive appearing osseous lesions are noted in the visualized portions of the skeleton. IMPRESSION: 1. Mild periportal edema in the liver, which is  a nonspecific finding that can be seen in the setting of hepatitis. Correlation with liver function tests is recommended. 2. No other acute findings to account for the patient's history of jaundice. Specifically, no choledocholithiasis or findings to suggest biliary obstruction. 3. Chronic atrophy of the left lobe of the liver, similar to prior studies. 4. Numerous nonobstructive calculi in the collecting systems of both kidneys. 5. Additional incidental findings, as above. Electronically Signed   By: Trudie Reed M.D.   On: 03/26/2023 09:31   US  Abdomen Limited RUQ (LIVER/GB)  Result Date: 03/24/2023 CLINICAL DATA:  Right upper quadrant abdominal pain EXAM: ULTRASOUND ABDOMEN LIMITED RIGHT UPPER QUADRANT COMPARISON:  Previous studies including the MRI none on 03/09/2023 FINDINGS: Gallbladder: No gallstones or wall thickening visualized. No sonographic Murphy sign noted by sonographer. Common bile duct: Diameter: 3.3 mm Liver: Left lobe of liver is not visualized consistent with history of atrophy. There is increased echogenicity in liver. No focal abnormalities are seen in visualized portions of liver. Portal vein is patent on color Doppler imaging with normal direction of blood flow towards the liver. Other: None. IMPRESSION: No acute findings are seen in right upper quadrant abdomen sonogram. Electronically Signed   By: Ernie Avena M.D.   On: 03/24/2023 17:40   MR ABDOMEN MRCP W WO CONTAST  Result Date: 03/10/2023 CLINICAL DATA:  History of PSC, elevated LFTs, up trend and alkaline phosphatase EXAM: MRI ABDOMEN WITHOUT AND WITH CONTRAST (INCLUDING MRCP) TECHNIQUE: Multiplanar multisequence MR imaging of the abdomen was performed both before and after the administration of intravenous contrast. Heavily T2-weighted images of the biliary and pancreatic ducts were obtained, and three-dimensional MRCP images were rendered by post processing. CONTRAST:  10mL GADAVIST GADOBUTROL 1 MMOL/ML IV SOLN COMPARISON:  12/15/2021 FINDINGS: Lower chest: No acute abnormality.  Cardiomegaly. Hepatobiliary: Unchanged appearance of the liver, with severely atrophic or surgically absent left lobe and numerous surgical clips in the adjacent abdomen, with associated susceptibility artifact. Similar appearance of intrahepatic ductal dilatation within the remnant left lobe of the liver (series 20, image 50), without right-sided biliary ductal dilatation nor common bile duct dilatation. Somewhat geographic, heterogeneous early arterial enhancement of the liver  parenchyma without focal liver lesion. No gallstones or gallbladder wall thickening. Pancreas: Unremarkable. No pancreatic ductal dilatation or surrounding inflammatory changes. Spleen: Normal in size without significant abnormality. Adrenals/Urinary Tract: Adrenal glands are unremarkable. Mildly atrophic kidneys. Multiple bilateral renal calculi, including a calculus near the left ureteropelvic junction measuring 1.0 cm (series 8, image 30). Inflammatory stranding and mucosal hyperenhancement of the left renal pelvis without overt hydronephrosis. Stomach/Bowel: Stomach is within normal limits. Long segment wall thickening mucosal hyperenhancement involving the transverse and included proximal descending colon (series 8, image 28, series 3, image 8). Vascular/Lymphatic: No significant vascular findings are present. Unchanged prominent portacaval and porta hepatis lymph nodes (series 18, image 71). Other: No abdominal wall hernia or abnormality. No ascites. Musculoskeletal: No acute or significant osseous findings. IMPRESSION: 1. Unchanged appearance of the liver, with severely atrophic or surgically absent left lobe and numerous surgical clips in the adjacent abdomen, with associated susceptibility artifact. 2. Similar appearance of intrahepatic ductal dilatation within the remnant left lobe of the liver, without right-sided biliary ductal dilatation nor common bile duct dilatation. 3. Somewhat geographic, heterogeneous early arterial enhancement of the liver parenchyma without focal liver lesion, likely perfusional. No focal or suspicious liver lesion. 4. Long segment wall thickening and mucosal hyperenhancement involving the transverse and included proximal descending colon, similar to  prior examination, and again suggestive of inflammatory bowel disease, particularly including ulcerative colitis, which is strongly associated with stated diagnosis of primary sclerosing cholangitis. 5. Multiple bilateral renal  calculi, including a 1.0 cm calculus near the left ureteropelvic junction. Inflammatory stranding and mucosal hyperenhancement of the left renal pelvis without overt hydronephrosis. This may reflect a mild degree of pyelonephritis. 6. Cardiomegaly. These results will be called to the ordering clinician or representative by the Radiologist Assistant, and communication documented in the PACS or [employer]. Electronically Signed   By: Jearld Lesch M.D.   On: 03/10/2023 15:19   MR 3D Recon At Scanner  Result Date: 03/10/2023 CLINICAL DATA:  History of PSC, elevated LFTs, up trend and alkaline phosphatase EXAM: MRI ABDOMEN WITHOUT AND WITH CONTRAST (INCLUDING MRCP) TECHNIQUE: Multiplanar multisequence MR imaging of the abdomen was performed both before and after the administration of intravenous contrast. Heavily T2-weighted images of the biliary and pancreatic ducts were obtained, and three-dimensional MRCP images were rendered by post processing. CONTRAST:  10mL GADAVIST GADOBUTROL 1 MMOL/ML IV SOLN COMPARISON:  12/15/2021 FINDINGS: Lower chest: No acute abnormality.  Cardiomegaly. Hepatobiliary: Unchanged appearance of the liver, with severely atrophic or surgically absent left lobe and numerous surgical clips in the adjacent abdomen, with associated susceptibility artifact. Similar appearance of intrahepatic ductal dilatation within the remnant left lobe of the liver (series 20, image 50), without right-sided biliary ductal dilatation nor common bile duct dilatation. Somewhat geographic, heterogeneous early arterial enhancement of the liver parenchyma without focal liver lesion. No gallstones or gallbladder wall thickening. Pancreas: Unremarkable. No pancreatic ductal dilatation or surrounding inflammatory changes. Spleen: Normal in size without significant abnormality. Adrenals/Urinary Tract: Adrenal glands are unremarkable. Mildly atrophic kidneys. Multiple bilateral renal calculi, including a calculus  near the left ureteropelvic junction measuring 1.0 cm (series 8, image 30). Inflammatory stranding and mucosal hyperenhancement of the left renal pelvis without overt hydronephrosis. Stomach/Bowel: Stomach is within normal limits. Long segment wall thickening mucosal hyperenhancement involving the transverse and included proximal descending colon (series 8, image 28, series 3, image 8). Vascular/Lymphatic: No significant vascular findings are present. Unchanged prominent portacaval and porta hepatis lymph nodes (series 18, image 71). Other: No abdominal wall hernia or abnormality. No ascites. Musculoskeletal: No acute or significant osseous findings. IMPRESSION: 1. Unchanged appearance of the liver, with severely atrophic or surgically absent left lobe and numerous surgical clips in the adjacent abdomen, with associated susceptibility artifact. 2. Similar appearance of intrahepatic ductal dilatation within the remnant left lobe of the liver, without right-sided biliary ductal dilatation nor common bile duct dilatation. 3. Somewhat geographic, heterogeneous early arterial enhancement of the liver parenchyma without focal liver lesion, likely perfusional. No focal or suspicious liver lesion. 4. Long segment wall thickening and mucosal hyperenhancement involving the transverse and included proximal descending colon, similar to prior examination, and again suggestive of inflammatory bowel disease, particularly including ulcerative colitis, which is strongly associated with stated diagnosis of primary sclerosing cholangitis. 5. Multiple bilateral renal calculi, including a 1.0 cm calculus near the left ureteropelvic junction. Inflammatory stranding and mucosal hyperenhancement of the left renal pelvis without overt hydronephrosis. This may reflect a mild degree of pyelonephritis. 6. Cardiomegaly. These results will be called to the ordering clinician or representative by the Radiologist Assistant, and communication  documented in the PACS or [employer]. Electronically Signed   By: Jearld Lesch M.D.   On: 03/10/2023 15:19    DISCHARGE EXAMINATION: Vitals:   03/27/23 1639 03/27/23 2203 03/28/23 0605 03/28/23 0930  BP: 123/69 117/65 107/71 103/81  Pulse: 68 71 76 69  Resp: 19 18 18 18   Temp: 97.8 F (36.6 C) 98.2 F (36.8 C) 98.3 F (36.8 C) 98.6 F (37 C)  TempSrc:  Oral Oral   SpO2: 96% 99% 99% 95%  Weight:      Height:       General appearance: Awake alert.  In no distress Resp: Clear to auscultation bilaterally.  Normal effort Cardio: S1-S2 is normal regular.  No S3-S4.  No rubs murmurs or bruit GI: Abdomen is soft.  Nontender nondistended.  Bowel sounds are present normal.  No masses organomegaly  DISPOSITION: Home  Discharge Instructions     Call MD for:  difficulty breathing, headache or visual disturbances   Complete by: As directed    Call MD for:  extreme fatigue   Complete by: As directed    Call MD for:  persistant dizziness or light-headedness   Complete by: As directed    Call MD for:  persistant nausea and vomiting   Complete by: As directed    Call MD for:  severe uncontrolled pain   Complete by: As directed    Call MD for:  temperature >100.4   Complete by: As directed    Diet - low sodium heart healthy   Complete by: As directed    Discharge instructions   Complete by: As directed    Please take medications as prescribed.  Please be sure to follow-up with your primary care provider within 1 week to check your liver function tests.  Follow-up with your [facility] providers as scheduled.  You were cared for by a hospitalist during your hospital stay. If you have any questions about your discharge medications or the care you received while you were in the hospital after you are discharged, you can call the unit and asked to speak with the hospitalist on call if the hospitalist that took care of you is not available. Once you are discharged, your primary care physician  will handle any further medical issues. Please note that NO REFILLS for any discharge medications will be authorized once you are discharged, as it is imperative that you return to your primary care physician (or establish a relationship with a primary care physician if you do not have one) for your aftercare needs so that they can reassess your need for medications and monitor your lab values. If you do not have a primary care physician, you can call [phone] for a physician referral.   Increase activity slowly   Complete by: As directed          Allergies as of 03/28/2023   No Known Allergies      Medication List     STOP taking these medications    ciprofloxacin 250 MG tablet Commonly known as: CIPRO       TAKE these medications    Entyvio 300 MG injection Generic drug: vedolizumab Inject 300 mg into the vein every 8 (eight) weeks.   folic acid 1 MG tablet Commonly known as: FOLVITE Take 1 tablet (1 mg total) by mouth daily.   Restora Caps Take 1 capsule by mouth daily.   ursodiol 300 MG capsule Commonly known as: ACTIGALL Take 1 capsule (300 mg total) by mouth 3 (three) times daily.   Vitamin B-12 1000 MCG Subl Place 2 tablets (2,000 mcg total) under the tongue daily.   VITAMIN D PO Take 1 tablet by mouth daily.  Follow-up Information     Benita Stabile, MD Follow up in 1 week(s).   Specialty: Internal Medicine Why: for blood work to check your liver function Contact information: [address] Rosanne Gutting [facility] [address] [phone]                 TOTAL DISCHARGE TIME: 35 mins  Sheldon Sem Rito Ehrlich  [facility] Pager on [email or web address]  03/28/2023, 2:34 PM

## 2023-03-28 NOTE — TOC Transition Note (Signed)
Transition of Care [facility]) - CM/SW Discharge Note   Patient Details  Name: Alex Thomas MRN: [id number] Date of Birth: 24-May-1956  Transition of Care [facility]) CM/SW Contact:  Alex Thomas, Alex Coria, RN Phone Number: 03/28/2023, 10:35 AM   Clinical Narrative:     Patient is scheduled for discharge today.  Readmission Risk Assessment done. Hospital f/u and discharge instructions on AVS. Prescriptions sent to [facility] pharmacy and meds will be delivered to patient at bedside prior discharge. No TOC needs or recommendations noted. Wife, Alex Thomas to transport at discharge.  No further TOC needs noted.      Final next level of care: Home/Self Care Barriers to Discharge: Barriers Resolved   Patient Goals and CMS Choice CMS Medicare.gov Compare Post Acute Care list provided to:: Patient Choice offered to / list presented to : NA  Discharge Placement                  Patient to be transferred to facility by: Wife Name of family member notified: [facility] Additional resources added to the After Visit Summary for                  DME Arranged: N/A DME Agency: NA       HH Arranged: NA HH Agency: NA        Social Determinants of Health (SDOH) Interventions SDOH Screenings   Tobacco Use: Low Risk  (03/24/2023)     Readmission Risk Interventions    03/27/2023    3:07 PM  Readmission Risk Prevention Plan  Post Dischage Appt Complete  Medication Screening Complete  Transportation Screening Complete

## 2023-03-28 NOTE — Care Management Important Message (Signed)
Important Message  Patient Details  Name: Alex Thomas MRN: [id number] Date of Birth: 1956/03/11   Medicare Important Message Given:  Yes     Dorena Bodo 03/28/2023, 2:23 PM

## 2023-03-29 ENCOUNTER — Other Ambulatory Visit (INDEPENDENT_AMBULATORY_CARE_PROVIDER_SITE_OTHER): Payer: Self-pay | Admitting: Gastroenterology

## 2023-03-29 ENCOUNTER — Encounter (HOSPITAL_COMMUNITY)
Admission: RE | Admit: 2023-03-29 | Discharge: 2023-03-29 | Disposition: A | Discharge status: Home / Self Care | Payer: Medicare Other | Source: Ambulatory Visit | Attending: Gastroenterology | Admitting: Gastroenterology

## 2023-03-29 VITALS — BP 90/70 | HR 100 | Temp 97.6°F | Resp 18

## 2023-03-29 DIAGNOSIS — E538 Deficiency of other specified B group vitamins: Secondary | ICD-10-CM

## 2023-03-29 DIAGNOSIS — K51019 Ulcerative (chronic) pancolitis with unspecified complications: Secondary | ICD-10-CM | POA: Diagnosis not present

## 2023-03-29 MED ORDER — VEDOLIZUMAB 300 MG IV SOLR
300.0000 mg | INTRAVENOUS | Status: DC
  Administered 2023-03-29: 300 mg via INTRAVENOUS
  Filled 2023-03-29: qty 5

## 2023-03-29 NOTE — Progress Notes (Signed)
Diagnosis: Ulcerative Colitis  Provider:  Katrinka Blazing MD  Procedure: IV Infusion  IV Type: Peripheral, IV Location: R Hand  Entyvio (Vedolizumab), Dose: 300 mg  Infusion Start Time: 0951  Infusion Stop Time: 1021  Post Infusion IV Care: Observation period completed  Discharge: Condition: Good, Destination: Home . AVS Declined  Performed by:  Claudia Desanctis, RN

## 2023-03-29 NOTE — Addendum Note (Signed)
Encounter addended by: Arrie Senate, RN on: 03/29/2023 11:01 AM  Actions taken: Therapy plan modified

## 2023-03-30 ENCOUNTER — Ambulatory Visit (INDEPENDENT_AMBULATORY_CARE_PROVIDER_SITE_OTHER): Payer: Medicare Other | Admitting: Gastroenterology

## 2023-03-30 ENCOUNTER — Encounter (INDEPENDENT_AMBULATORY_CARE_PROVIDER_SITE_OTHER): Payer: Self-pay | Admitting: Gastroenterology

## 2023-03-30 VITALS — BP 78/51 | HR 117 | Temp 96.2°F | Ht 75.0 in | Wt 297.8 lb

## 2023-03-30 DIAGNOSIS — R7989 Other specified abnormal findings of blood chemistry: Secondary | ICD-10-CM

## 2023-03-30 DIAGNOSIS — E871 Hypo-osmolality and hyponatremia: Secondary | ICD-10-CM | POA: Diagnosis not present

## 2023-03-30 DIAGNOSIS — R031 Nonspecific low blood-pressure reading: Secondary | ICD-10-CM | POA: Diagnosis not present

## 2023-03-30 DIAGNOSIS — D649 Anemia, unspecified: Secondary | ICD-10-CM | POA: Diagnosis not present

## 2023-03-30 DIAGNOSIS — K51018 Ulcerative (chronic) pancolitis with other complication: Secondary | ICD-10-CM | POA: Diagnosis not present

## 2023-03-30 DIAGNOSIS — K51019 Ulcerative (chronic) pancolitis with unspecified complications: Secondary | ICD-10-CM

## 2023-03-30 DIAGNOSIS — R195 Other fecal abnormalities: Secondary | ICD-10-CM | POA: Diagnosis not present

## 2023-03-30 DIAGNOSIS — R161 Splenomegaly, not elsewhere classified: Secondary | ICD-10-CM | POA: Diagnosis not present

## 2023-03-30 DIAGNOSIS — Z79899 Other long term (current) drug therapy: Secondary | ICD-10-CM | POA: Diagnosis not present

## 2023-03-30 DIAGNOSIS — K838 Other specified diseases of biliary tract: Secondary | ICD-10-CM | POA: Diagnosis not present

## 2023-03-30 DIAGNOSIS — R932 Abnormal findings on diagnostic imaging of liver and biliary tract: Secondary | ICD-10-CM | POA: Diagnosis not present

## 2023-03-30 DIAGNOSIS — K7290 Hepatic failure, unspecified without coma: Secondary | ICD-10-CM | POA: Diagnosis not present

## 2023-03-30 DIAGNOSIS — K831 Obstruction of bile duct: Secondary | ICD-10-CM | POA: Diagnosis not present

## 2023-03-30 DIAGNOSIS — D631 Anemia in chronic kidney disease: Secondary | ICD-10-CM | POA: Diagnosis not present

## 2023-03-30 DIAGNOSIS — E559 Vitamin D deficiency, unspecified: Secondary | ICD-10-CM | POA: Diagnosis not present

## 2023-03-30 DIAGNOSIS — Z903 Acquired absence of stomach [part of]: Secondary | ICD-10-CM | POA: Diagnosis not present

## 2023-03-30 DIAGNOSIS — R7401 Elevation of levels of liver transaminase levels: Secondary | ICD-10-CM | POA: Diagnosis not present

## 2023-03-30 DIAGNOSIS — R17 Unspecified jaundice: Secondary | ICD-10-CM | POA: Diagnosis not present

## 2023-03-30 DIAGNOSIS — K5190 Ulcerative colitis, unspecified, without complications: Secondary | ICD-10-CM | POA: Diagnosis not present

## 2023-03-30 DIAGNOSIS — N182 Chronic kidney disease, stage 2 (mild): Secondary | ICD-10-CM | POA: Diagnosis not present

## 2023-03-30 DIAGNOSIS — N179 Acute kidney failure, unspecified: Secondary | ICD-10-CM | POA: Diagnosis not present

## 2023-03-30 DIAGNOSIS — R0602 Shortness of breath: Secondary | ICD-10-CM | POA: Diagnosis not present

## 2023-03-30 DIAGNOSIS — K7689 Other specified diseases of liver: Secondary | ICD-10-CM | POA: Diagnosis not present

## 2023-03-30 DIAGNOSIS — R21 Rash and other nonspecific skin eruption: Secondary | ICD-10-CM | POA: Diagnosis not present

## 2023-03-30 DIAGNOSIS — R591 Generalized enlarged lymph nodes: Secondary | ICD-10-CM | POA: Diagnosis not present

## 2023-03-30 DIAGNOSIS — T182XXA Foreign body in stomach, initial encounter: Secondary | ICD-10-CM | POA: Diagnosis not present

## 2023-03-30 DIAGNOSIS — K8301 Primary sclerosing cholangitis: Secondary | ICD-10-CM | POA: Diagnosis not present

## 2023-03-30 DIAGNOSIS — Z4659 Encounter for fitting and adjustment of other gastrointestinal appliance and device: Secondary | ICD-10-CM | POA: Diagnosis not present

## 2023-03-30 DIAGNOSIS — K5100 Ulcerative (chronic) pancolitis without complications: Secondary | ICD-10-CM | POA: Diagnosis not present

## 2023-03-30 DIAGNOSIS — N200 Calculus of kidney: Secondary | ICD-10-CM | POA: Diagnosis not present

## 2023-03-30 DIAGNOSIS — E860 Dehydration: Secondary | ICD-10-CM | POA: Diagnosis not present

## 2023-03-30 DIAGNOSIS — K529 Noninfective gastroenteritis and colitis, unspecified: Secondary | ICD-10-CM | POA: Diagnosis not present

## 2023-03-30 DIAGNOSIS — Z79620 Long term (current) use of immunosuppressive biologic: Secondary | ICD-10-CM | POA: Diagnosis not present

## 2023-03-30 DIAGNOSIS — R748 Abnormal levels of other serum enzymes: Secondary | ICD-10-CM | POA: Diagnosis not present

## 2023-03-30 LAB — ANCA PROFILE
Anti-MPO Antibodies: <0.2 units (ref 0.0–0.9)
Anti-PR3 Antibodies: <0.2 units (ref 0.0–0.9)
Atypical P-ANCA titer: <1:20 {titer}
C-ANCA: <1:20 {titer}
P-ANCA: <1:20 {titer}

## 2023-03-30 MED ORDER — RESTORA PO CAPS
1.0000 | ORAL_CAPSULE | Freq: Every day | ORAL | 3 refills | Status: DC
Start: 2023-03-30 — End: 2023-07-03

## 2023-03-30 NOTE — Patient Instructions (Addendum)
Please go today to the ER at [facility] for evaluation of elevated bilirubin and hypotension, need for possible liver biopsy NOTE TO THE ED: PATIENT STARTED CIPROFLOXACIN AFTER LFTS WERE CHECKED, HYPERBILIRUBINEMIA IS NOT RELATED TO BILIRUBIN, RECOMMEND HEPATOLOGY CONSULT - PATIENT OF DR. Mauri Reading  Continue Restora probiotics daily Cotninue with Entyvio every 4 weeks for now Keep appointment on 7/31 with hepatology and 8/1 with IBD clinic at [facility]

## 2023-03-30 NOTE — Progress Notes (Signed)
Alex Thomas, M.D. Gastroenterology & Hepatology [facility] Gastroenterology [address] [place], [place] [address]  Primary Care Physician: Benita Stabile, MD [address] [place] [address]  I will communicate my assessment and recommendations to the referring MD via EMR.  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  The patient was last seen on 02/16/2023. At that time, the patient reported feeling relatively well on Entyvio.  The patient was referred again to [facility] IBD clinic for evaluation of low - grade dysplasia found in random sampling of his colon - this was found at 50 cm from the anal verge but this was invisible dysplasia with narrow band imaging/white light.  Patient was reached on 03/24/2023 as we received labs from PCPs office performed on 03/22/2023 which showed a bilirubin of 11.8, alkaline phosphatase 802, AST 150, ALT 126, albumin 3.3, CBC with WBC 12.5.  These labs were checked as he was having some urinary frequency and he was being checked for UTI for which he is receiving ciprofloxacin treatment.   Patient reports that he took cirpofloxacin on Tuesday. That was the only thing he actually did different prior to having abnormal symptoms. However, his labs were elevated prior to taking the ciprofloxacin, as the labs were checked by his PCP before starting ciprofloxacin. He saw his PCP because he was having dark urine since Sunday 03/19/2023. Did not start any medications before he  noticed the changes in his urine. His wife states she noticed he was jaundiced on the same Sunday.  Due to this he was advised to go to to [facility] for further evaluation.  Patient was admitted to the hospital and was found to have normal hepatitis A/B/C serologies, ANA, AMA, ASMA, IgG, TTG IgA, ceruloplasmin, A1AT.  Notably his CEA level was elevated at 5.1.  CA 19-9 was mildly elevated at 75.  Patient underwent an MRCP which showed some periportal edema and atrophy of the left lobe of the liver but no stricture, there was presence of biliary ductal dilation consistent with PSC.  It was considered that he is elevated liver enzymes were related to his PSC versus drug-induced liver injury secondary to ciprofloxacin.  Patient was discharged on URSO.  He was also advised to take restora probiotics to improve his bowel movement frequency.  Patient is currently on URSO 300 mg 3 times daily.  He has been receiving Entyvio 300 mg every 4 weeks.  He is having 2 bowel movements per day. Is taking Restora probiotics. Noticed this improved his bowel movement frequency as he has having 4-5 bowel movements - soft to watery in consistency. Also taking turmeric.  Reports having some itching in his arms. Also reports that he has presented persisting fatigue and tired the whole day today. He was feeling very fatigued for the last 3-4 weeks.  Overall, he reports that he is not feeling well and is very tired.  Patient has an appointment on 04/12/2023 to see Dr. Allena Katz in the liver hepatology clinic and an appointment on 04/13/2023 to see Dr. Clyde Lundborg in the IBD clinic.  The patient denies having any nausea, vomiting, fever, chills, hematochezia, melena, hematemesis, abdominal distention, abdominal pain. Has lost significant amount of weight for the last few months.  Last Colonoscopy:09/2022   The perianal and digital rectal examinations were normal.  The terminal ileum appeared normal.  Inflammation was found  in a continuous and circumferential pattern from the descending colon ( 50 cm from anal verge) to the cecum. This was graded as Mayo Score 1 ( mild, with erythema, decreased vascular pattern, mild friability) , and when compared to the previous examination, the findings are improved as there were no erosions and vascularity appeared to be more preserved. No inflammation ( Mayo 0) was found distal to 50 cm. Biopsies were taken with a cold forceps for histology every 10 cm.  One 15 to 20 mm mucosal nodularity was found in the mid ascending colon. Area was successfully injected with 20 mL Eleview for lesion assessment, but the lesion could not be lifted adequately and remained very flat so no polypectomy could be performed. Biopsies were taken with a cold forceps for histology. A contralateral area 2 cm distal to the nodularity was tattooed with an injection of 1 mL of Spot ( carbon black) .  The retroflexed view of the distal rectum and anal verge was normal and showed no anal or rectal abnormalities.  FINAL MICROSCOPIC DIAGNOSIS:  A. COLON, ASCENDING, NODULE, BIOPSY: - Consistent with inflammatory polyp - Negative for dysplasia  B. COLON, 90 CM, BIOPSY: - Mildly active chronic colitis, consistent with patient's clinical history of ulcerative colitis - Negative for granulomas or dysplasia  C. COLON, 80 CM, BIOPSY: - Mildly active chronic colitis, consistent with patient's clinical history of ulcerative colitis - Negative for granulomas or dysplasia  D. COLON, 70 CM, BIOPSY: - Mildly active chronic colitis, consistent with patient's clinical history of ulcerative colitis - Negative for granulomas or dysplasia  E. COLON, 60 CM, BIOPSY: - Mildly active chronic colitis, consistent with patient's clinical history of ulcerative colitis - Negative for granulomas or dysplasia  F. COLON, 50 CM, BIOPSY: - Low-grade dysplasia arising in background of mildly active chronic ulcerative colitis, see  comment - Negative for granulomas  G. COLON, 40 CM, BIOPSY: - Mildly active chronic colitis, consistent with patient's clinical history of ulcerative colitis - Negative for granulomas or dysplasia  H. COLON, 30 CM, BIOPSY: - Mildly active chronic colitis, consistent with patient's clinical history of ulcerative colitis - Negative for granulomas or dysplasia  I. COLON, 20 CM, BIOPSY: - Mildly active chronic colitis, consistent with patient's clinical history of ulcerative colitis - Negative for granulomas or dysplasia  J. COLON, 10 CM, BIOPSY: - Inactive chronic proctitis, consistent with patient's clinical history of ulcerative colitis - Negative for granulomas or dysplasia  COMMENT:  F.  Some foci show increased cytologic atypia than usually seen in low-grade dysplasia but clear-cut high-grade dysplasia is not identified.  Dr. Kenyon Ana reviewed the case and concurs with the above diagnosis.   Presence of low-grade dysplasia at 50 cm -random biopsies.  This was confirmed by 2 pathologists.  Thorough discussion was held with the patient regarding the significance of low-grade dysplasia. discussed that this is classified as invisible dysplasia.  discussed the importance of having an evaluation at a tertiary center such as [facility] with an inflammatory bowel disease group that can evaluate this further with chromoendoscopy and potential resection if the lesion is visible/resectable.  Also, depending on the findings, he may be better served if there is need for  surgical intervention.  Past Medical History: Past Medical History:  Diagnosis Date   Bladder cancer [facility])    age 23   History of kidney stones    Multiple gastric ulcers     Past Surgical History: Past Surgical History:  Procedure Laterality Date   APPENDECTOMY     BIOPSY  11/23/2021   Procedure: BIOPSY;  Surgeon: Dolores Frame, MD;  Location: [facility];  Service: Gastroenterology;;   BIOPSY  09/23/2022    Procedure: BIOPSY;  Surgeon: Dolores Frame, MD;  [facility];  Service: Gastroenterology;;   COLONOSCOPY WITH PROPOFOL N/A 11/23/2021   Procedure: COLONOSCOPY WITH PROPOFOL;  Surgeon: Dolores Frame, MD;  Location[facility];  Service: Gastroenterology;  Laterality: N/A;  805 ASA 1   COLONOSCOPY WITH PROPOFOL N/A 09/23/2022   Procedure: COLONOSCOPY WITH PROPOFOL;  Surgeon: Dolores Frame, MD;  Location[facility];  Service: Gastroenterology;  Laterality: N/A;  1:00pm, asa 2, pt knows to arrive at 10:00   GASTRECTOMY     HERNIA REPAIR     KIDNEY STONE SURGERY     POLYPECTOMY  11/23/2021   Procedure: POLYPECTOMY;  Surgeon: Dolores Frame, MD;  [facility];  Service: Gastroenterology;;   Susa Day  09/23/2022   Procedure: Susa Day;  Surgeon: Marguerita Merles, Reuel Boom, MD;  Location: [facility];  Service: Gastroenterology;;    Family History: Family History  Problem Relation Age of Onset   Colon cancer Mother     Social History: Social History   Tobacco Use  Smoking Status Never   Passive exposure: Past  Smokeless Tobacco Never   Social History   Substance and Sexual Activity  Alcohol Use Not Currently   Social History   Substance and Sexual Activity  Drug Use Not Currently    Allergies: No Known Allergies  Medications: Current Outpatient Medications  Medication Sig Dispense Refill   Cyanocobalamin (VITAMIN B-12) 1000 MCG SUBL Place 2 tablets (2,000 mcg total) under the tongue daily. 180 tablet 1   folic acid (FOLVITE) 1 MG tablet TAKE ONE (1) TABLET BY MOUTH EVERY DAY 90 tablet 1   Probiotic Product (RESTORA) CAPS Take 1 capsule by mouth daily.     ursodiol (ACTIGALL) 300 MG capsule Take 1 capsule (300 mg total) by mouth 3 (three) times daily. 90 capsule 0   vedolizumab (ENTYVIO) 300 MG injection Inject 300 mg into the vein every 8 (eight) weeks.     VITAMIN D PO Take 1 tablet by mouth  daily.     No current facility-administered medications for this visit.    Review of Systems: GENERAL: negative for malaise, night sweats HEENT: No changes in hearing or vision, no nose bleeds or other nasal problems. NECK: Negative for lumps, goiter, pain and significant neck swelling RESPIRATORY: Negative for cough, wheezing CARDIOVASCULAR: Negative for chest pain, leg swelling, palpitations, orthopnea GI: SEE HPI MUSCULOSKELETAL: Negative for joint pain or swelling, back pain, and muscle pain. SKIN: Negative for lesions, rash PSYCH: Negative for sleep disturbance, mood disorder and recent psychosocial stressors. HEMATOLOGY Negative for prolonged bleeding, bruising easily, and swollen nodes. ENDOCRINE: Negative for cold or heat intolerance, polyuria, polydipsia and goiter. NEURO: negative for tremor, gait imbalance, syncope and seizures. The remainder of the review of systems is noncontributory.   Physical Exam: BP (!) 78/51 (BP Location: Right Arm, Patient Position: Sitting, Cuff Size: Large)   Pulse (!) 117   Temp (!) 96.2 F (35.7 C) (Temporal)   Ht  6\' 3"  (1.905 m)   Wt 297 lb 12.8 oz (135.1 kg)   BMI 37.22 kg/m  GENERAL: The patient is AO x3, in no acute distress. HEENT: Head is normocephalic and atraumatic. EOMI are intact. Mouth is well hydrated and without lesions. NECK: Supple. No masses LUNGS: Clear to auscultation. No presence of rhonchi/wheezing/rales. Adequate chest expansion HEART: RRR, normal s1 and s2. ABDOMEN: Soft, nontender, no guarding, no peritoneal signs, and nondistended. BS +. No masses. EXTREMITIES: Without any cyanosis, clubbing, rash, lesions or edema. NEUROLOGIC: AOx3, no focal motor deficit. SKIN: generalized jaundice, has scratches in arms  Imaging/Labs: as above  I personally reviewed and interpreted the available labs, imaging and endoscopic files.  Impression and Plan: MAXWELL LEMEN is a 67 y.o. 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  Patient had acute severe elevation of his liver enzymes, specifically his alkaline phosphatase and total bilirubin.  It was initially considered that this was related to drug-induced liver injury but the patient did not start any antibiotics or medications before he had the blood workup performed at his PCPs office.  He had multiple serologies performed recently which were negative for autoimmune or viral etiologies.  Unfortunately, during today's examination the patient is presenting severe fatigue with hypotension (confirmed x 2) and looks in worrisome conditions.  We had a very thorough discussion regarding the importance of having him evaluated again by a multidisciplinary team tertiary center, for which he will proceed to be evaluated in the ER at [facility] where he is establish for his liver disease.  He would like to go on his own to the ER instead of calling EMS as he is now feeling lightheaded and would not like to call EMS.  I will plan to follow him in our office in a short time span.  I encouraged him to follow-up with his hepatologist as scheduled as well.  In terms of his inflammatory bowel disease, it does not seem that he has achieved adequate symptom control with the use of Entyvio at max dose.  It is very likely we will need to switch him to another agent.  However, I consider that it is important for him to be evaluated first by Dr. Clyde Lundborg at the IBD clinic in [facility] since he has presence of low-grade dysplasia in random biopsies and recent mild elevation of CEA levels.  This will help him determine the next steps in management at a tertiary center such as switching to another advanced therapy, repeating colonoscopy with chromoendoscopy  and potential colectomy.  For now we will keep him on Entyvio every 4 weeks.  I will refill his Restora probiotic as he has felt better with this.  -Patient advised to go today to the ER at [facility] for evaluation of elevated bilirubin/alkaline phosphatase and hypotension, need for possible liver biopsy - Continue Restora probiotics daily - Continue with Entyvio every 4 weeks for now - Keep appointment on 7/31 with hepatology and 8/1 with IBD clinic at [facility]  All questions were answered.      Alex Blazing, MD Gastroenterology and Hepatology [facility] Gastroenterology

## 2023-03-30 NOTE — Telephone Encounter (Signed)
Mr Roepke is scheduled:  04/12/23 at 900 am (arrive at 830 am) He will be seeing: Darcus Austin, PA  Location: [address] [facility] [address] - Clinic [facility]J

## 2023-03-30 NOTE — Telephone Encounter (Signed)
-----   Message from Katrinka Blazing Mayorga sent at 03/29/2023  3:10 PM EDT ----- Thanks Gunnar Fusi.  Mitzie, can you please schedule a follow up appointment for this patient in next available with me or any of the APPs?  Croy Drumwright, can you try reaching [facility] hepatology (Dr. Mauri Reading) and ask if they can see the patient soon as well?  They recently saw him but he had a big spike in his LFTs.  Thanks,  Katrinka Blazing, MD Gastroenterology and Hepatology [facility] Gastroenterology ----- Message ----- From: Meredith Pel, NP Sent: 03/28/2023   1:46 PM EDT To: Dolores Frame, MD  Hi,   Just wanted to give you an FYI. This nice patient is being discharged from [facility] today. I asked him to follow up with you. We started him on Urso. I told him he would likely need repeat LFTs with you in a week or so.  Thanks,  Gunnar Fusi

## 2023-04-06 ENCOUNTER — Telehealth (INDEPENDENT_AMBULATORY_CARE_PROVIDER_SITE_OTHER): Payer: Self-pay

## 2023-04-06 NOTE — Telephone Encounter (Signed)
Patient called and wanted to know if needs a hospital follow up from recent Hospitalization at [facility]? He has been discharged. Please advise.

## 2023-04-07 DIAGNOSIS — E785 Hyperlipidemia, unspecified: Secondary | ICD-10-CM | POA: Diagnosis not present

## 2023-04-07 DIAGNOSIS — E871 Hypo-osmolality and hyponatremia: Secondary | ICD-10-CM | POA: Diagnosis not present

## 2023-04-07 DIAGNOSIS — Z9049 Acquired absence of other specified parts of digestive tract: Secondary | ICD-10-CM | POA: Diagnosis not present

## 2023-04-07 DIAGNOSIS — K8301 Primary sclerosing cholangitis: Secondary | ICD-10-CM | POA: Diagnosis not present

## 2023-04-07 DIAGNOSIS — N400 Enlarged prostate without lower urinary tract symptoms: Secondary | ICD-10-CM | POA: Insufficient documentation

## 2023-04-07 DIAGNOSIS — E559 Vitamin D deficiency, unspecified: Secondary | ICD-10-CM | POA: Diagnosis not present

## 2023-04-07 DIAGNOSIS — K5100 Ulcerative (chronic) pancolitis without complications: Secondary | ICD-10-CM | POA: Diagnosis not present

## 2023-04-07 DIAGNOSIS — K760 Fatty (change of) liver, not elsewhere classified: Secondary | ICD-10-CM | POA: Diagnosis not present

## 2023-04-07 DIAGNOSIS — D649 Anemia, unspecified: Secondary | ICD-10-CM | POA: Diagnosis not present

## 2023-04-07 DIAGNOSIS — D72829 Elevated white blood cell count, unspecified: Secondary | ICD-10-CM | POA: Diagnosis not present

## 2023-04-08 NOTE — Telephone Encounter (Signed)
He has an appointment with me in mid August, which he should keep for now. He should also follow up with the IBD clinic and hepatology doctor at [facility]. Please confirm with the patient if he has an appointment to see the doctor that did his most recent procedure (ERCP) or if he has a procedure already scheduled in the next few weeks, as he had a stent placed in his most recent ERCP.  Thanks

## 2023-04-10 NOTE — Telephone Encounter (Signed)
Thanks a lot Toniann Fail, appreciate it

## 2023-04-10 NOTE — Telephone Encounter (Signed)
Discussed with patient and he has follow up he states in 8 weeks to have stent replaced with Dr. Jilda Roche at [facility].  And has follow up with Dr. Marius Ditch July 31st and Dr. Secundino Ginger aug 1st.

## 2023-04-12 DIAGNOSIS — R748 Abnormal levels of other serum enzymes: Secondary | ICD-10-CM | POA: Diagnosis not present

## 2023-04-13 DIAGNOSIS — K51019 Ulcerative (chronic) pancolitis with unspecified complications: Secondary | ICD-10-CM | POA: Diagnosis not present

## 2023-04-13 DIAGNOSIS — K8301 Primary sclerosing cholangitis: Secondary | ICD-10-CM | POA: Diagnosis not present

## 2023-04-13 DIAGNOSIS — Z79899 Other long term (current) drug therapy: Secondary | ICD-10-CM | POA: Diagnosis not present

## 2023-04-18 ENCOUNTER — Telehealth (INDEPENDENT_AMBULATORY_CARE_PROVIDER_SITE_OTHER): Payer: Self-pay | Admitting: Gastroenterology

## 2023-04-18 NOTE — Telephone Encounter (Signed)
I received the clinic note from 04/13/2023 by Dr. Secundino Ginger.  Unfortunately, I did not see any mention about the most recent colonoscopy in January 2024 and the presence of low-grade dysplasia, which was the reason for the referral.  I called Dr. Lorne Skeens office to notify them about this.  The nurse from [facility] Gastroenterology Clinic at [facility] confirmed that they received the colonoscopy from January 2024.  She stated that she will notify Dr. Lorne Skeens team so they can call me back to discuss Alex Thomas case

## 2023-04-25 DIAGNOSIS — Z79899 Other long term (current) drug therapy: Secondary | ICD-10-CM | POA: Diagnosis not present

## 2023-04-25 DIAGNOSIS — K51019 Ulcerative (chronic) pancolitis with unspecified complications: Secondary | ICD-10-CM | POA: Diagnosis not present

## 2023-04-26 ENCOUNTER — Telehealth (INDEPENDENT_AMBULATORY_CARE_PROVIDER_SITE_OTHER): Payer: Self-pay | Admitting: Gastroenterology

## 2023-04-26 ENCOUNTER — Encounter (HOSPITAL_COMMUNITY): Payer: Medicare Other | Attending: Gastroenterology | Admitting: Emergency Medicine

## 2023-04-26 ENCOUNTER — Encounter (HOSPITAL_COMMUNITY): Admission: RE | Admit: 2023-04-26 | Payer: Medicare Other | Source: Ambulatory Visit

## 2023-04-26 VITALS — BP 117/77 | HR 59 | Temp 97.5°F | Resp 18

## 2023-04-26 DIAGNOSIS — K51019 Ulcerative (chronic) pancolitis with unspecified complications: Secondary | ICD-10-CM | POA: Diagnosis not present

## 2023-04-26 MED ORDER — VEDOLIZUMAB 300 MG IV SOLR
300.0000 mg | Freq: Once | INTRAVENOUS | Status: AC
  Administered 2023-04-26: 300 mg via INTRAVENOUS
  Filled 2023-04-26: qty 5

## 2023-04-26 NOTE — Telephone Encounter (Signed)
Have not received any call from Dr. Secundino Ginger, faxed again to his office the most recetn colonoscopy and pathology report from 09/2022

## 2023-04-26 NOTE — Progress Notes (Signed)
Diagnosis: Ulcerative Colitis  Provider:  Katrinka Blazing MD  Procedure: IV Infusion  IV Type: Peripheral, IV Location: L Hand  Entyvio (Vedolizumab), Dose: 300 mg  Infusion Start Time: 0952  Infusion Stop Time: 40981  Post Infusion IV Care: Patient declined observation and Peripheral IV Discontinued  Discharge: Condition: Good, Destination: Home . AVS Provided  Performed by:  Arrie Senate, RN

## 2023-04-27 ENCOUNTER — Ambulatory Visit: Payer: Medicare Other | Admitting: Urology

## 2023-04-27 DIAGNOSIS — K51019 Ulcerative (chronic) pancolitis with unspecified complications: Secondary | ICD-10-CM | POA: Diagnosis not present

## 2023-04-27 DIAGNOSIS — Z79899 Other long term (current) drug therapy: Secondary | ICD-10-CM | POA: Diagnosis not present

## 2023-04-27 DIAGNOSIS — R748 Abnormal levels of other serum enzymes: Secondary | ICD-10-CM | POA: Diagnosis not present

## 2023-05-01 ENCOUNTER — Other Ambulatory Visit (INDEPENDENT_AMBULATORY_CARE_PROVIDER_SITE_OTHER): Payer: Self-pay | Admitting: Gastroenterology

## 2023-05-01 ENCOUNTER — Ambulatory Visit (INDEPENDENT_AMBULATORY_CARE_PROVIDER_SITE_OTHER): Payer: Medicare Other | Admitting: Gastroenterology

## 2023-05-01 ENCOUNTER — Encounter (INDEPENDENT_AMBULATORY_CARE_PROVIDER_SITE_OTHER): Payer: Self-pay | Admitting: Gastroenterology

## 2023-05-01 VITALS — BP 105/75 | HR 68 | Temp 97.3°F | Ht 75.0 in | Wt 308.0 lb

## 2023-05-01 DIAGNOSIS — K51019 Ulcerative (chronic) pancolitis with unspecified complications: Secondary | ICD-10-CM | POA: Diagnosis not present

## 2023-05-01 DIAGNOSIS — K8301 Primary sclerosing cholangitis: Secondary | ICD-10-CM | POA: Diagnosis not present

## 2023-05-01 DIAGNOSIS — K831 Obstruction of bile duct: Secondary | ICD-10-CM | POA: Insufficient documentation

## 2023-05-01 DIAGNOSIS — K639 Disease of intestine, unspecified: Secondary | ICD-10-CM | POA: Diagnosis not present

## 2023-05-01 DIAGNOSIS — Z111 Encounter for screening for respiratory tuberculosis: Secondary | ICD-10-CM

## 2023-05-01 NOTE — Patient Instructions (Addendum)
Continue with Entyvio every 4 weeks. Perform blood workup Proceed with endoscopic retrograde cholangiopancreatography tomorrow as scheduled with Dr. Shana Chute at [facility] Please discussed with the gastroenterology [facility] physician or directly at Dr. Lolita Cram office that you were referred to the IBD clinic for invisible low-grade dysplasia found on your most recent colonoscopy in January 2024.  Please bring a copy of the report and show it to them.

## 2023-05-01 NOTE — Progress Notes (Signed)
Katrinka Blazing, M.D. Gastroenterology & Hepatology [facility]enter[facility] [address] [place], [place] [address]  Primary Care Physician: Benita Stabile, MD [address] [place] [address]  I will communicate my assessment and recommendations to the referring MD via EMR.  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  The patient was last seen on 03/30/2023. At that time, the patient was advised to go to [facility] for evaluation of persistently worsening elevation of the liver enzymes.  He was advised to continue Entyvio every 4 weeks..  During his thorough evaluation at [facility] from 03/30/2023 to 04/04/2023, he was found to have abdominal stricture for which he underwent endoscopic retrograde cholangiopancreatography.  Brushings were performed (negative for malignancy) and stent was placed to allow adequate drainage.  Cholangiogram was worrisome for possible cholangiocarcinoma.  CEA was 6.3 and CA 19-9 was 128. Patient was scheduled for ERCP on 06/05/2023, but Dr. Julaine Hua and Dr. Allena Katz discussed with advanced endoscopist at [facility] and he will be undergoing ERCP tomorrow with Dr. Shana Chute.  Patient reports that after he left the hospital at [facility] he has felt much better. He feels his symptoms have improved - he is having 2 bowel movements per day. Stool is soft and formed, no blood or melena. The patient denies having any  nausea, vomiting, fever, chills, hematochezia, melena, hematemesis, abdominal distention, abdominal pain, diarrhea, jaundice, pruritus or weight loss.  He may want to have stop Restora.  Patient was seen by Dr. Secundino Ginger on 04/13/2023.  He is supposed to have a repeat colonoscopy on 07/21/2023.  I reviewed the notes and there is no mention of the low-grade dysplasia found on January 2024.  I tried to communicate with Dr. Secundino Ginger but unfortunately have not been able to get a hold of him.  I resent the colonoscopy and pathology reports to his office but have not been able to get a hold of him.  Last Entyvio infusion was on Friday.  Most recent labs from Thursday 8/15 at [facility] showed normal AST 36, ALT 25, alkaline phosphatase persistently elevated 522, total bilirubin better 2.6, direct bilirubin 1.7, albumin 2.9, INR 1.0, vedolizumab levels are pending.  Last Colonoscopy:09/2022   The perianal and digital rectal examinations were normal.   The terminal ileum appeared normal.   Inflammation was found in a continuous and circumferential pattern from the descending colon ( 50 cm from anal verge) to the cecum. This was graded as Mayo Score 1 ( mild, with erythema, decreased vascular pattern, mild friability) , and when compared to the previous examination, the findings are improved as there were no erosions and vascularity appeared to be more preserved. No inflammation ( Mayo 0) was found distal to 50 cm. Biopsies were taken with a cold forceps for histology every 10 cm.   One 15 to 20 mm mucosal nodularity was found in the mid ascending colon. Area was successfully injected with 20 mL Eleview for lesion assessment, but the lesion could not be lifted adequately and remained very flat so  no polypectomy could be performed. Biopsies were taken with a cold forceps for histology. A contralateral area 2 cm distal to the nodularity was tattooed with an injection of 1 mL of Spot ( carbon black) .   The retroflexed  view of the distal rectum and anal verge was normal and showed no anal or rectal abnormalities.   FINAL MICROSCOPIC DIAGNOSIS:  A. COLON, ASCENDING, NODULE, BIOPSY: - Consistent with inflammatory polyp - Negative for dysplasia  B. COLON, 90 CM, BIOPSY: - Mildly active chronic colitis, consistent with patient's clinical history of ulcerative colitis - Negative for granulomas or dysplasia  C. COLON, 80 CM, BIOPSY: - Mildly active chronic colitis, consistent with patient's clinical history of ulcerative colitis - Negative for granulomas or dysplasia  D. COLON, 70 CM, BIOPSY: - Mildly active chronic colitis, consistent with patient's clinical history of ulcerative colitis - Negative for granulomas or dysplasia  E. COLON, 60 CM, BIOPSY: - Mildly active chronic colitis, consistent with patient's clinical history of ulcerative colitis - Negative for granulomas or dysplasia  F. COLON, 50 CM, BIOPSY: - Low-grade dysplasia arising in background of mildly active chronic ulcerative colitis, see comment - Negative for granulomas  G. COLON, 40 CM, BIOPSY: - Mildly active chronic colitis, consistent with patient's clinical history of ulcerative colitis - Negative for granulomas or dysplasia  H. COLON, 30 CM, BIOPSY: - Mildly active chronic colitis, consistent with patient's clinical history of ulcerative colitis - Negative for granulomas or dysplasia  I. COLON, 20 CM, BIOPSY: - Mildly active chronic colitis, consistent with patient's clinical history of ulcerative colitis - Negative for granulomas or dysplasia  J. COLON, 10 CM, BIOPSY: - Inactive chronic proctitis, consistent with patient's clinical history of ulcerative colitis - Negative for granulomas or dysplasia  COMMENT:  F.  Some foci show increased cytologic atypia than usually seen in low-grade dysplasia but clear-cut high-grade dysplasia is not identified.  Dr. Kenyon Ana reviewed the case and concurs with the  above diagnosis.    Presence of low-grade dysplasia at 50 cm -random biopsies.  This was confirmed by 2 pathologists.  Thorough discussion was held with the patient regarding the significance of low-grade dysplasia. discussed that this is classified as invisible dysplasia.  discussed the importance of having an evaluation at a tertiary center such as [facility] with an inflammatory bowel disease group that can evaluate this further with chromoendoscopy and potential resection if the lesion is visible/resectable.  Also, depending on the findings, he may be better served if there is need for surgical intervention.  Last ERCP: ERCP (7/23) Findings: A scout film of the abdomen was obtained. Surgical  clips were seen in the area of the right upper  quadrant of the abdomen and mid upper abdomen. The esophagus was successfully intubated under direct  vision without detailed examination of the pharynx,  larynx, and associated structures. The upper GI tract  was traversed under direct vision without detailed  examination. A medium amount of food (residue) was  found on the greater curvature of the stomach. The  upper GI exam was otherwise without abnormality. The  major papilla was normal. The bile duct was deeply cannulated with the  short-nosed traction sphincterotome. Contrast was  injected. I personally interpreted the bile duct  images. There was brisk flow of contrast through the  ducts. Image quality was excellent. Contrast extended  to the entire biliary tree. The common hepatic duct  contained a single moderate stenosis 18 mm in length.  The left intrahepatic branches and  right intrahepatic  branches were mildly dilated to 9 mm. The intrahepatic  ducts otherwise do not seem strictured. A long 0.035  inch Soft Jagwire passed successfully into the left  intrahepatic branches. Biliary sphincterotomy was made  with a monofilament traction (standard) sphincterotome  using ERBE electrocautery.  There was no  post-sphincterotomy bleeding. To discover objects, the  biliary tree was swept with a retrieval balloon  starting at the bifurcation. Stone debris and sludge  were swept from the duct. The CHD stricture was  successfully dilated with a 6 mm balloon dilator.  Cells for cytology were obtained by brushing across  the CHD stricture. One 10 Fr by 10 cm plastic stent  with a single external flap and a single internal flap  was placed into the right hepatic duct. Bile flowed  through the stent. The stent was in good position. A pancreatogram was not attempted. The endoscope was withdrawn from the patient. Estimated Blood Loss: Estimated blood loss: none. Impression: - A medium amount of retained food residue was present  in the stomach. - The major papilla appeared normal. - A biliary sphincterotomy was performed. The biliary  tree was swept and debris and sludge was found and  removed. - A single moderate biliary stricture with upstream  ductal dilation was found in the common hepatic duct.  The stricture was brushed for cytology and treated  with balloon dilation + 10 french stent placement. The  cholangiogram findings are worrisome for a dominant  process such as cholangiocarcinoma. PSC seems less  likely based on the fairly normal appearance of the  intrahepatic ducts. - A pancreatogram was not attempted.   Past Medical History: Past Medical History:  Diagnosis Date   Bladder cancer [facility])    age 25   History of kidney stones    Multiple gastric ulcers     Past Surgical History: Past Surgical History:  Procedure Laterality Date   APPENDECTOMY     BIOPSY  11/23/2021   Procedure: BIOPSY;  Surgeon: Dolores Frame, MD;  [facility]: [facility];  Service: Gastroenterology;;   BIOPSY  09/23/2022   Procedure: BIOPSY;  Surgeon: Dolores Frame, MD;  [facility];  Service: Gastroenterology;;   COLONOSCOPY WITH PROPOFOL N/A 11/23/2021    Procedure: COLONOSCOPY WITH PROPOFOL;  Surgeon: Dolores Frame, MD;  [facility];  Service: Gastroenterology;  Laterality: N/A;  805 ASA 1   COLONOSCOPY WITH PROPOFOL N/A 09/23/2022   Procedure: COLONOSCOPY WITH PROPOFOL;  Surgeon: Dolores Frame, MD;  [facility];  Service: Gastroenterology;  Laterality: N/A;  1:00pm, asa 2, pt knows to arrive at 10:00   GASTRECTOMY     HERNIA REPAIR     KIDNEY STONE SURGERY     POLYPECTOMY  11/23/2021   Procedure: POLYPECTOMY;  Surgeon: Marguerita Merles, Reuel Boom, MD;  [facility]: [facility];  Service: Gastroenterology;;   Susa Day  09/23/2022   Procedure: Susa Day;  Surgeon: Marguerita Merles, Reuel Boom, MD;  [facility]: [facility];  Service: Gastroenterology;;    Family History: Family History  Problem Relation Age of Onset   Colon cancer Mother     Social History: Social History   Tobacco Use  Smoking Status Never   Passive exposure: Past  Smokeless Tobacco Never   Social History   Substance and Sexual Activity  Alcohol Use Not Currently   Social History   Substance and Sexual Activity  Drug Use Not Currently    Allergies: No Known Allergies  Medications: Current Outpatient Medications  Medication Sig Dispense Refill   Cyanocobalamin (VITAMIN B-12) 1000 MCG SUBL Place 2 tablets (2,000 mcg total) under the tongue daily. 180 tablet 1   folic acid (FOLVITE) 1 MG tablet TAKE ONE (1) TABLET BY MOUTH EVERY DAY 90 tablet 1   Probiotic Product (RESTORA) CAPS Take 1 capsule by mouth daily. 90 capsule 3   vedolizumab (ENTYVIO) 300 MG injection Inject 300 mg into the vein every 8 (eight) weeks.     VITAMIN D PO Take 1 tablet by mouth daily.     No current facility-administered medications for this visit.    Review of Systems: GENERAL: negative for malaise, night sweats HEENT: No changes in hearing or vision, no nose bleeds or other nasal problems. NECK: Negative for lumps, goiter,  pain and significant neck swelling RESPIRATORY: Negative for cough, wheezing CARDIOVASCULAR: Negative for chest pain, leg swelling, palpitations, orthopnea GI: SEE HPI MUSCULOSKELETAL: Negative for joint pain or swelling, back pain, and muscle pain. SKIN: Negative for lesions, rash PSYCH: Negative for sleep disturbance, mood disorder and recent psychosocial stressors. HEMATOLOGY Negative for prolonged bleeding, bruising easily, and swollen nodes. ENDOCRINE: Negative for cold or heat intolerance, polyuria, polydipsia and goiter. NEURO: negative for tremor, gait imbalance, syncope and seizures. The remainder of the review of systems is noncontributory.   Physical Exam: BP 105/75 (BP Location: Left Arm, Patient Position: Sitting, Cuff Size: Normal)   Pulse 68   Temp (!) 97.3 F (36.3 C) (Temporal)   Ht 6\' 3"  (1.905 m)   Wt (!) 308 lb (139.7 kg)   BMI 38.50 kg/m  GENERAL: The patient is AO x3, in no acute distress. HEENT: Head is normocephalic and atraumatic. EOMI are intact. Mouth is well hydrated and without lesions. NECK: Supple. No masses LUNGS: Clear to auscultation. No presence of rhonchi/wheezing/rales. Adequate chest expansion HEART: RRR, normal s1 and s2. ABDOMEN: Soft, nontender, no guarding, no peritoneal signs, and nondistended. BS +. No masses. EXTREMITIES: Without any cyanosis, clubbing, rash, lesions or edema. NEUROLOGIC: AOx3, no focal motor deficit. SKIN: no jaundice, no rashes  Imaging/Labs: as above  I personally reviewed and interpreted the available labs, imaging and endoscopic files.  Impression and Plan: CICERO WICKES is a 67 y.o. 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  The patient has  presented very complicated history of biliary abnormalities.  It was initially thought that her LFT abnormalities and imaging findings were consistent with PSC.  He was being evaluated by transplant hepatology at [facility] as well.  Both our clinic and do consider expectant management with monitoring of blood work.  However, his clinical condition deteriorated recently and was hospitalized at [facility].  Was found to have stricture in the common hepatic duct.  Based on the endoscopic retrograde cholangiopancreatography findings, it was not clear if the findings were related to [facility] or/and possible malignancy in the common hepatic duct.  We had a thorough discussion about this.  The patient understood and will proceed with ERCP tomorrow.  Depending on findings of cytology brushings and further testing by Dr. Shana Chute, we will discuss treatment options.  In terms of his IBD, it seems that he is possible ulcerative colitis is getting better control with the Entyvio every 4 weeks.  He is at max  dose of Entyvio.  We will follow the results of the most recent vedolizumab levels.  He is currently being evaluated by Dr. Secundino Ginger at [facility] as well.  Unfortunately, I did not see any note in his consult where he discussed the invisible low-grade dysplasia found in random colonic biopsies performed in January 2024.  I have tried to reach him in the past but have not been able to discuss the case with him.  I will try to reach him again but I advised the patient to discuss with him these findings (I added the specifics in his AVS, which Mr. Sadlier will show him).  Ideally, he should have an evaluation with chromoendoscopy.  -Continue with Entyvio every 4 weeks. -Check QuantiFERON -Proceed with endoscopic retrograde cholangiopancreatography tomorrow as scheduled with Dr. Shana Chute at [facility] -Patient will try to discuss with the gastroenterology physicians at [facility] or directly at Dr. Lolita Cram office that he was referred to the IBD clinic  for invisible low-grade dysplasia found on most recent colonoscopy in January 2024.  I will try to reach Dr. Evorn Gong office as well again.  All questions were answered.      Katrinka Blazing, MD Gastroenterology and Hepatology [facility] Gastroenterology

## 2023-05-02 DIAGNOSIS — K51919 Ulcerative colitis, unspecified with unspecified complications: Secondary | ICD-10-CM | POA: Diagnosis not present

## 2023-05-02 DIAGNOSIS — Z903 Acquired absence of stomach [part of]: Secondary | ICD-10-CM | POA: Diagnosis not present

## 2023-05-02 DIAGNOSIS — K838 Other specified diseases of biliary tract: Secondary | ICD-10-CM | POA: Diagnosis not present

## 2023-05-02 DIAGNOSIS — T85520A Displacement of bile duct prosthesis, initial encounter: Secondary | ICD-10-CM | POA: Diagnosis not present

## 2023-05-02 DIAGNOSIS — Z8711 Personal history of peptic ulcer disease: Secondary | ICD-10-CM | POA: Diagnosis not present

## 2023-05-02 DIAGNOSIS — K5190 Ulcerative colitis, unspecified, without complications: Secondary | ICD-10-CM | POA: Diagnosis not present

## 2023-05-02 DIAGNOSIS — K8301 Primary sclerosing cholangitis: Secondary | ICD-10-CM | POA: Diagnosis not present

## 2023-05-02 DIAGNOSIS — Z79620 Long term (current) use of immunosuppressive biologic: Secondary | ICD-10-CM | POA: Diagnosis not present

## 2023-05-02 DIAGNOSIS — Z79899 Other long term (current) drug therapy: Secondary | ICD-10-CM | POA: Diagnosis not present

## 2023-05-02 DIAGNOSIS — K8050 Calculus of bile duct without cholangitis or cholecystitis without obstruction: Secondary | ICD-10-CM | POA: Diagnosis not present

## 2023-05-02 DIAGNOSIS — R748 Abnormal levels of other serum enzymes: Secondary | ICD-10-CM | POA: Diagnosis not present

## 2023-05-02 DIAGNOSIS — K8051 Calculus of bile duct without cholangitis or cholecystitis with obstruction: Secondary | ICD-10-CM | POA: Diagnosis not present

## 2023-05-02 DIAGNOSIS — K831 Obstruction of bile duct: Secondary | ICD-10-CM | POA: Diagnosis not present

## 2023-05-02 DIAGNOSIS — Z4659 Encounter for fitting and adjustment of other gastrointestinal appliance and device: Secondary | ICD-10-CM | POA: Diagnosis not present

## 2023-05-02 DIAGNOSIS — Z4689 Encounter for fitting and adjustment of other specified devices: Secondary | ICD-10-CM | POA: Diagnosis not present

## 2023-05-02 DIAGNOSIS — N182 Chronic kidney disease, stage 2 (mild): Secondary | ICD-10-CM | POA: Diagnosis not present

## 2023-05-03 DIAGNOSIS — I480 Paroxysmal atrial fibrillation: Secondary | ICD-10-CM | POA: Insufficient documentation

## 2023-05-03 DIAGNOSIS — Z87891 Personal history of nicotine dependence: Secondary | ICD-10-CM | POA: Diagnosis not present

## 2023-05-03 LAB — QUANTIFERON-TB GOLD PLUS
QuantiFERON Mitogen Value: >10 [IU]/mL
QuantiFERON Nil Value: 0 [IU]/mL
QuantiFERON TB1 Ag Value: 0 [IU]/mL
QuantiFERON TB2 Ag Value: 0.01 [IU]/mL
QuantiFERON-TB Gold Plus: NEGATIVE

## 2023-05-04 ENCOUNTER — Other Ambulatory Visit (INDEPENDENT_AMBULATORY_CARE_PROVIDER_SITE_OTHER): Payer: Self-pay | Admitting: *Deleted

## 2023-05-16 DIAGNOSIS — Z87891 Personal history of nicotine dependence: Secondary | ICD-10-CM | POA: Insufficient documentation

## 2023-05-16 NOTE — Progress Notes (Signed)
Name: Alex Thomas DOB: Jul 24, 1956 MRN: [id number]  History of Present Illness: Mr. Alex Thomas is a 67 y.o. male who presents today for new patient visit at [facility]. - GU history: 1. Bladder cancer. History of TA bladder cancer near his right UO.  2. BPH. Taking Flomax 0.4 mg daily. 3. Kidney stones (recurrent). - 05/30/2014: Underwent left PCNL for a 2 cm stone. Stone composition was 80% calcium phosphate and 20% calcium oxalate. - Previously took potassium citrate and Beelith for stone prevention. - Currently taking Chanca Piedra supplement (Phyllanthus niruri) for stone prevention. - 03/25/2023: Abdomen MRI showed "Numerous nonobstructive calculi in the collecting systems of both kidneys." 4. Hypogonadism. - Has done well on testosterone cypionate injections.  Previous urology workup includes: - Previously followed at [facility].  - 05/12/2014: Pathology from bladder biopsy showed "papillary hyperplasia, cannot rule out recurrent low grade urothelial cancer".  - 07/01/2014: Per visit note the plan was "Follow up in 6 months for repeat cystoscopy to evaluate for recurrent bladder lesions and send off labs (testosterone, psa, cbc) at that time." - 12/30/2021: CT abdomen/pelvis w/o contrast showed no evidence of concerning GU mass.  Recent history: > 03/21/2023: Seen by PCP for dysuria, frequency, gross hematuria, back pain, and general malaise / tired. Treated for suspected UTI with prescription for Cipro 250 mg 2x/day x5 days.  > 03/24/2023 - 03/28/2023: Admitted for jaundice in the setting of primary sclerosing cholangitis with mild coagulopathy.   > 03/31/2023: UA with 22 RBC & 24 WBC, nitrite negative. Asymptomatic for UTI. Cipro discontinued.  > 04/04/2023: GFR 74; creatinine 1.1  Today: He reports normal strong urinary stream. He denies urinary hesitancy, urgency, frequency, dysuria, gross hematuria, straining to void, or sensations of  incomplete emptying. Reports nocturia x1-2. Denies incontinence.  He denies recent stone passage. He denies flank or abdominal pain.   Fall Screening: Do you usually have a device to assist in your mobility? No   Medications: Current Outpatient Medications  Medication Sig Dispense Refill   Cyanocobalamin (VITAMIN B-12) 1000 MCG SUBL Place 2 tablets (2,000 mcg total) under the tongue daily. 180 tablet 1   folic acid (FOLVITE) 1 MG tablet TAKE ONE (1) TABLET BY MOUTH EVERY DAY 90 tablet 1   Probiotic Product (RESTORA) CAPS Take 1 capsule by mouth daily. 90 capsule 3   tamsulosin (FLOMAX) 0.4 MG CAPS capsule Take 0.4 mg by mouth daily.     vedolizumab (ENTYVIO) 300 MG injection Inject 300 mg into the vein every 8 (eight) weeks.     VITAMIN D PO Take 1 tablet by mouth daily.     No current facility-administered medications for this visit.    Allergies: No Known Allergies  Past Medical History:  Diagnosis Date   Bladder cancer [facility])    age 43   History of kidney stones    Multiple gastric ulcers    Past Surgical History:  Procedure Laterality Date   APPENDECTOMY     BIOPSY  11/23/2021   Procedure: BIOPSY;  Surgeon: Dolores Frame, MD;  [facility];  Service: Gastroenterology;;   BIOPSY  09/23/2022   Procedure: BIOPSY;  Surgeon: Dolores Frame, MD;  [facility];  Service: Gastroenterology;;   COLONOSCOPY WITH PROPOFOL N/A 11/23/2021   Procedure: COLONOSCOPY WITH PROPOFOL;  Surgeon: Dolores Frame, MD;  [facility];  Service: Gastroenterology;  Laterality: N/A;  805 ASA 1   COLONOSCOPY WITH PROPOFOL N/A  09/23/2022   Procedure: COLONOSCOPY WITH PROPOFOL;  Surgeon: Dolores Frame, MD;  [facility];  Service: Gastroenterology;  Laterality: N/A;  1:00pm, asa 2, pt knows to arrive at 10:00   GASTRECTOMY     HERNIA REPAIR     KIDNEY STONE SURGERY     POLYPECTOMY  11/23/2021   Procedure:  POLYPECTOMY;  Surgeon: Marguerita Merles, Reuel Boom, MD;  [facility]: [facility];  Service: Gastroenterology;;   Susa Day  09/23/2022   Procedure: Susa Day;  Surgeon: Marguerita Merles, Reuel Boom, MD;  [facility]: [facility];  Service: Gastroenterology;;   Family History  Problem Relation Age of Onset   Colon cancer Mother    Social History   Socioeconomic History   Marital status: Married    Spouse name: Not on file   Number of children: Not on file   Years of education: Not on file   Highest education level: Not on file  Occupational History   Not on file  Tobacco Use   Smoking status: Never    Passive exposure: Past   Smokeless tobacco: Never  Vaping Use   Vaping status: Never Used  Substance and Sexual Activity   Alcohol use: Not Currently   Drug use: Not Currently   Sexual activity: Not Currently  Other Topics Concern   Not on file  Social History Narrative   Not on file   Social Determinants of Health   Financial Resource Strain: Not on file  Food Insecurity: Not on file  Transportation Needs: No Transportation Needs (03/31/2023)   Received from [facility]   PRAPARE - Transportation    In the past 12 months, has lack of transportation kept you from medical appointments or from getting medications?: No    Lack of Transportation (Non-Medical): No  Physical Activity: Not on file  Stress: Not on file  Social Connections: Not on file  Intimate Partner Violence: Not on file    Review of Systems Constitutional: Patient denies any unintentional weight loss or change in strength lntegumentary: Patient denies any rashes or pruritus Cardiovascular: Patient denies chest pain or syncope Respiratory: Patient denies shortness of breath Gastrointestinal: Patient denies nausea, vomiting, constipation, or diarrhea Musculoskeletal: Patient denies muscle cramps or weakness Neurologic: Patient denies convulsions or seizures Psychiatric: Patient denies  memory problems Allergic/Immunologic: Patient denies recent allergic reaction(s) Hematologic/Lymphatic: Patient denies bleeding tendencies Endocrine: Patient denies heat/cold intolerance  GU: As per HPI.  OBJECTIVE Vitals:   05/17/23 1136  BP: 117/79  Pulse: 61  Temp: 97.8 F (36.6 C)   There is no height or weight on file to calculate BMI.  Physical Examination Constitutional: No obvious distress; patient is non-toxic appearing  Cardiovascular: No visible lower extremity edema.  Respiratory: The patient does not have audible wheezing/stridor; respirations do not appear labored  Gastrointestinal: Abdomen non-distended Musculoskeletal: Normal ROM of UEs  Skin: No obvious rashes/open sores  Neurologic: CN 2-12 grossly intact Psychiatric: Answered questions appropriately with normal affect  Hematologic/Lymphatic/Immunologic: No obvious bruises or sites of spontaneous bleeding  UA: 0-5 WBC/hpf, >30 RBC/hpf, bacteria PVR: 26 ml  ASSESSMENT Malignant neoplasm of urinary bladder, unspecified site (HCC) - Plan: Urinalysis, Routine w reflex microscopic, BLADDER SCAN AMB NON-IMAGING, Cytology, urine, CT ABDOMEN PELVIS W WO CONTRAST  Gross hematuria - Plan: Urinalysis, Routine w reflex microscopic, BLADDER SCAN AMB NON-IMAGING, Cytology, urine, CT ABDOMEN PELVIS W WO CONTRAST  History of urinary stone - Plan: Urinalysis, Routine w reflex microscopic, BLADDER SCAN AMB NON-IMAGING, CT ABDOMEN PELVIS W WO  CONTRAST  Benign prostatic hyperplasia, unspecified whether lower urinary tract symptoms present - Plan: Urinalysis, Routine w reflex microscopic, BLADDER SCAN AMB NON-IMAGING, CT ABDOMEN PELVIS W WO CONTRAST  Male hypogonadism - Plan: Urinalysis, Routine w reflex microscopic, BLADDER SCAN AMB NON-IMAGING  Former smoker - Plan: Urinalysis, Routine w reflex microscopic, BLADDER SCAN AMB NON-IMAGING, Cytology, urine, CT ABDOMEN PELVIS W WO CONTRAST  For management of gross hematuria, we  discussed possible etiologies including but not limited to: vigorous exercise, sexual activity, stone, trauma, blood thinner use, urinary tract infection, kidney function, BPH, prostatitis, malignancy. We discussed pt's smoking as a risk factor for GU cancer and encouraged continued smoking cessation.  We discussed the importance of work-up including assessing the upper and lower GU tract with CT urogram and cystoscopy. We will also check voided cytology.  We discussed the risk for clot retention and pt was advised to increase fluid intake to thin out clots. Pt was advised to go to the ER if they become unable to urinate due to clot retention, start having symptoms of anemia (which were discussed), or any other significant concerning acute symptoms.  Pt verbalized understanding and decided to pursue this work-up. Patient agreed to follow-up afterward to discuss the results and formulate a treatment plan based on the findings. All questions were answered.  PLAN Advised the following: 1. CT hematuria protocol. 2. Voided cytology. 3. Return for 1st available cystoscopy with any urology MD.  Orders Placed This Encounter  Procedures   CT ABDOMEN PELVIS W WO CONTRAST    Standing Status:   Future    Standing Expiration Date:   05/16/2024    Order Specific Question:   If indicated for the ordered procedure, I authorize the administration of contrast media per Radiology protocol    Answer:   Yes    Order Specific Question:   Does the patient have a contrast media/X-ray dye allergy?    Answer:   No    Order Specific Question:   Preferred imaging location?    Answer:   [facility]    Order Specific Question:   If indicated for the ordered procedure, I authorize the administration of oral contrast media per Radiology protocol    Answer:   Yes   Urinalysis, Routine w reflex microscopic   BLADDER SCAN AMB NON-IMAGING    It has been explained that the patient is to follow regularly with their  PCP in addition to all other providers involved in their care and to follow instructions provided by these respective offices. Patient advised to contact urology clinic if any urologic-pertaining questions, concerns, new symptoms or problems arise in the interim period.  There are no Patient Instructions on file for this visit.  Electronically signed by:  Donnita Falls, FNP   05/17/23    12:08 PM

## 2023-05-17 ENCOUNTER — Ambulatory Visit (INDEPENDENT_AMBULATORY_CARE_PROVIDER_SITE_OTHER): Payer: Medicare Other | Admitting: Urology

## 2023-05-17 ENCOUNTER — Encounter: Payer: Self-pay | Admitting: Urology

## 2023-05-17 VITALS — BP 117/79 | HR 61 | Temp 97.8°F

## 2023-05-17 DIAGNOSIS — Z87442 Personal history of urinary calculi: Secondary | ICD-10-CM

## 2023-05-17 DIAGNOSIS — R310 Gross hematuria: Secondary | ICD-10-CM

## 2023-05-17 DIAGNOSIS — C679 Malignant neoplasm of bladder, unspecified: Secondary | ICD-10-CM | POA: Diagnosis not present

## 2023-05-17 DIAGNOSIS — Z87891 Personal history of nicotine dependence: Secondary | ICD-10-CM | POA: Diagnosis not present

## 2023-05-17 DIAGNOSIS — N400 Enlarged prostate without lower urinary tract symptoms: Secondary | ICD-10-CM

## 2023-05-17 DIAGNOSIS — E291 Testicular hypofunction: Secondary | ICD-10-CM

## 2023-05-17 LAB — URINALYSIS, ROUTINE W REFLEX MICROSCOPIC
Bilirubin, UA: NEGATIVE
Glucose, UA: NEGATIVE
Ketones, UA: NEGATIVE
Nitrite, UA: NEGATIVE
Specific Gravity, UA: 1.02 (ref 1.005–1.030)
Urobilinogen, Ur: 0.2 mg/dL (ref 0.2–1.0)
pH, UA: 6.5 (ref 5.0–7.5)

## 2023-05-17 LAB — MICROSCOPIC EXAMINATION
Bacteria, UA: NONE SEEN
RBC, Urine: >30 /HPF — AB (ref 0–2)

## 2023-05-17 LAB — BLADDER SCAN AMB NON-IMAGING: Scan Result: 26

## 2023-05-19 LAB — CYTOLOGY, URINE

## 2023-05-24 ENCOUNTER — Encounter: Payer: Medicare Other | Attending: Gastroenterology

## 2023-05-24 VITALS — BP 115/91 | HR 57 | Temp 97.7°F | Resp 18

## 2023-05-24 DIAGNOSIS — K51019 Ulcerative (chronic) pancolitis with unspecified complications: Secondary | ICD-10-CM | POA: Diagnosis not present

## 2023-05-24 MED ORDER — VEDOLIZUMAB 300 MG IV SOLR
300.0000 mg | Freq: Once | INTRAVENOUS | Status: AC
  Administered 2023-05-24: 300 mg via INTRAVENOUS
  Filled 2023-05-24: qty 5

## 2023-05-24 NOTE — Progress Notes (Signed)
Diagnosis: Ulcerative pancolitit  Provider:  Katrinka Blazing MD  Procedure: IV Infusion  IV Type: Peripheral, IV Location: L Hand  Evinity (Romosozumab-[id number]), Dose: 300 mg  Infusion Start Time: 0931  Infusion Stop Time: 1005  Post Infusion IV Care: Peripheral IV Discontinued  Discharge: Condition: Good, Destination: Home . AVS Provided  Performed by:  Marlow Baars [place], RN

## 2023-05-31 ENCOUNTER — Telehealth (INDEPENDENT_AMBULATORY_CARE_PROVIDER_SITE_OTHER): Payer: Self-pay | Admitting: *Deleted

## 2023-05-31 ENCOUNTER — Other Ambulatory Visit (INDEPENDENT_AMBULATORY_CARE_PROVIDER_SITE_OTHER): Payer: Self-pay | Admitting: *Deleted

## 2023-05-31 DIAGNOSIS — E559 Vitamin D deficiency, unspecified: Secondary | ICD-10-CM

## 2023-05-31 NOTE — Telephone Encounter (Signed)
Called patient because he is in reminder file to repeat vit d. He asked me how much vit d was he suppose to be taking. I let him know the note said he told you he was taking 5000 and you told him to increase to 15000. He said he doesn't remember that and he has been taking vit d 3000 daily. Should he still repeat vit d level or wait and start taking 40981 units first.

## 2023-06-01 ENCOUNTER — Ambulatory Visit (HOSPITAL_COMMUNITY)
Admission: RE | Admit: 2023-06-01 | Discharge: 2023-06-01 | Disposition: A | Discharge status: Home / Self Care | Payer: Medicare Other | Source: Ambulatory Visit | Attending: Urology | Admitting: Urology

## 2023-06-01 DIAGNOSIS — N281 Cyst of kidney, acquired: Secondary | ICD-10-CM | POA: Diagnosis not present

## 2023-06-01 DIAGNOSIS — Z87442 Personal history of urinary calculi: Secondary | ICD-10-CM | POA: Diagnosis not present

## 2023-06-01 DIAGNOSIS — Z87891 Personal history of nicotine dependence: Secondary | ICD-10-CM | POA: Insufficient documentation

## 2023-06-01 DIAGNOSIS — N200 Calculus of kidney: Secondary | ICD-10-CM | POA: Diagnosis not present

## 2023-06-01 DIAGNOSIS — K7210 Chronic hepatic failure without coma: Secondary | ICD-10-CM | POA: Diagnosis not present

## 2023-06-01 DIAGNOSIS — C679 Malignant neoplasm of bladder, unspecified: Secondary | ICD-10-CM | POA: Diagnosis not present

## 2023-06-01 DIAGNOSIS — K429 Umbilical hernia without obstruction or gangrene: Secondary | ICD-10-CM | POA: Diagnosis not present

## 2023-06-01 DIAGNOSIS — R310 Gross hematuria: Secondary | ICD-10-CM | POA: Insufficient documentation

## 2023-06-01 DIAGNOSIS — N400 Enlarged prostate without lower urinary tract symptoms: Secondary | ICD-10-CM | POA: Diagnosis present

## 2023-06-01 LAB — POCT I-STAT CREATININE: Creatinine, Ser: 1.4 mg/dL — ABNORMAL HIGH (ref 0.61–1.24)

## 2023-06-01 MED ORDER — IOHEXOL 300 MG/ML  SOLN
100.0000 mL | Freq: Once | INTRAMUSCULAR | Status: AC | PRN
  Administered 2023-06-01: 100 mL via INTRAVENOUS

## 2023-06-01 NOTE — Telephone Encounter (Signed)
Discussed with patient per [facility] have him increase as previously recommended and repeat in 3 months Patient aware to increase vit d to 15,000 units daily per [place] and repeat labs in 3 months. Pt added to reminder file.

## 2023-06-03 DIAGNOSIS — R609 Edema, unspecified: Secondary | ICD-10-CM | POA: Diagnosis not present

## 2023-06-13 DIAGNOSIS — E559 Vitamin D deficiency, unspecified: Secondary | ICD-10-CM | POA: Diagnosis not present

## 2023-06-14 ENCOUNTER — Telehealth: Payer: Self-pay

## 2023-06-14 ENCOUNTER — Encounter (INDEPENDENT_AMBULATORY_CARE_PROVIDER_SITE_OTHER): Payer: Self-pay

## 2023-06-14 LAB — VITAMIN D 25 HYDROXY (VIT D DEFICIENCY, FRACTURES): Vit D, 25-Hydroxy: 20.3 ng/mL — ABNORMAL LOW (ref 30.0–100.0)

## 2023-06-14 NOTE — Telephone Encounter (Signed)
Patient is made aware and voiced understanding. 

## 2023-06-14 NOTE — Telephone Encounter (Signed)
-----   Message from Donnita Falls sent at 06/14/2023  9:12 AM EDT ----- Please let pt know: - CT showed bilateral nonobstructing kidney stones; no acute findings. It's possible his stones may be the source of his microscopic hematuria, however due to history of bladder cancer he is advised to proceed with cystoscopy as scheduled tomorrow (06/15/23) with Dr. Annabell Howells.  - If no acute findings on cystoscopy, advised to discuss stone procedure with Dr. Annabell Howells at tomorrow's visit given that he has several large stones (>10 mm) in both kidneys.

## 2023-06-15 ENCOUNTER — Encounter (INDEPENDENT_AMBULATORY_CARE_PROVIDER_SITE_OTHER): Payer: Self-pay

## 2023-06-15 ENCOUNTER — Other Ambulatory Visit (INDEPENDENT_AMBULATORY_CARE_PROVIDER_SITE_OTHER): Payer: Self-pay | Admitting: *Deleted

## 2023-06-15 ENCOUNTER — Ambulatory Visit (INDEPENDENT_AMBULATORY_CARE_PROVIDER_SITE_OTHER): Payer: Medicare Other | Admitting: Urology

## 2023-06-15 ENCOUNTER — Encounter: Payer: Self-pay | Admitting: Urology

## 2023-06-15 VITALS — BP 112/74 | HR 72 | Ht 75.0 in | Wt 308.0 lb

## 2023-06-15 DIAGNOSIS — N200 Calculus of kidney: Secondary | ICD-10-CM

## 2023-06-15 DIAGNOSIS — R310 Gross hematuria: Secondary | ICD-10-CM

## 2023-06-15 DIAGNOSIS — Z8551 Personal history of malignant neoplasm of bladder: Secondary | ICD-10-CM | POA: Diagnosis not present

## 2023-06-15 DIAGNOSIS — C679 Malignant neoplasm of bladder, unspecified: Secondary | ICD-10-CM | POA: Diagnosis not present

## 2023-06-15 NOTE — Progress Notes (Signed)
Subjective:  1. Personal history of bladder cancer   2. Renal stones   3. Gross hematuria     Alex Thomas is a 67 yo male who was seen on 05/17/23 by Evette Georges for gross hematuria.  On CT he has a 9mm left renal pelvic stone and a 12mm LLP stone as well as a 9mm right renal pelvic stone and 12mm RMP stone.  The stones are radiopaque and visible on the scout films.  He has a history of bladder cancer with a prior Ta tumor near the right UO and he has BPH with BOO that is managed with tamsulosin.   He has had prior stones with PCNL in 9/15.  He is on TRT for hypogonadism.  His Cr was 1.4 on 06/01/23.    IPSS     Row Name 06/15/23 1300         International Prostate Symptom Score   How often have you had the sensation of not emptying your bladder? Less than 1 in 5     How often have you had to urinate less than every two hours? Not at All     How often have you found you stopped and started again several times when you urinated? Not at All     How often have you found it difficult to postpone urination? Not at All     How often have you had a weak urinary stream? Less than 1 in 5 times     How often have you had to strain to start urination? Not at All     How many times did you typically get up at night to urinate? 2 Times     Total IPSS Score 4       Quality of Life due to urinary symptoms   If you were to spend the rest of your life with your urinary condition just the way it is now how would you feel about that? Mostly Satisfied               ROS:  ROS:  A complete review of systems was performed.  All systems are negative except for pertinent findings as noted.   ROS  No Known Allergies  Outpatient Encounter Medications as of 06/15/2023  Medication Sig Note   Cyanocobalamin (VITAMIN B-12) 1000 MCG SUBL Place 2 tablets (2,000 mcg total) under the tongue daily.    folic acid (FOLVITE) 1 MG tablet TAKE ONE (1) TABLET BY MOUTH EVERY DAY    Probiotic Product (RESTORA) CAPS Take  1 capsule by mouth daily.    tamsulosin (FLOMAX) 0.4 MG CAPS capsule Take 0.4 mg by mouth daily.    vedolizumab (ENTYVIO) 300 MG injection Inject 300 mg into the vein every 8 (eight) weeks. 02/16/2023: Every 4 weeks per patient   VITAMIN D PO Take 2 tablets by mouth daily. 10,000 international units daily    No facility-administered encounter medications on file as of 06/15/2023.    Past Medical History:  Diagnosis Date   Bladder cancer [facility])    age 49   History of kidney stones    Multiple gastric ulcers     Past Surgical History:  Procedure Laterality Date   APPENDECTOMY     BIOPSY  11/23/2021   Procedure: BIOPSY;  Surgeon: Dolores Frame, MD;  [facility];  Service: Gastroenterology;;   BIOPSY  09/23/2022   Procedure: BIOPSY;  Surgeon: Dolores Frame, MD;  [facility];  Service: Gastroenterology;;  COLONOSCOPY WITH PROPOFOL N/A 11/23/2021   Procedure: COLONOSCOPY WITH PROPOFOL;  Surgeon: Dolores Frame, MD;  [facility];  Service: Gastroenterology;  Laterality: N/A;  805 ASA 1   COLONOSCOPY WITH PROPOFOL N/A 09/23/2022   Procedure: COLONOSCOPY WITH PROPOFOL;  Surgeon: Dolores Frame, MD;  [facility];  Service: Gastroenterology;  Laterality: N/A;  1:00pm, asa 2, pt knows to arrive at 10:00   GASTRECTOMY     HERNIA REPAIR     KIDNEY STONE SURGERY     POLYPECTOMY  11/23/2021   Procedure: POLYPECTOMY;  Surgeon: Marguerita Merles, Reuel Boom, MD;  [facility];  Service: Gastroenterology;;   Susa Day  09/23/2022   Procedure: Susa Day;  Surgeon: Marguerita Merles, Reuel Boom, MD;  [facility];  Service: Gastroenterology;;    Social History   Socioeconomic History   Marital status: Married    Spouse name: Not on file   Number of children: Not on file   Years of education: Not on file   Highest education level: Not on file  Occupational History   Not on file  Tobacco  Use   Smoking status: Never    Passive exposure: Past   Smokeless tobacco: Never  Vaping Use   Vaping status: Never Used  Substance and Sexual Activity   Alcohol use: Not Currently   Drug use: Not Currently   Sexual activity: Not Currently  Other Topics Concern   Not on file  Social History Narrative   Not on file   Social Determinants of Health   Financial Resource Strain: Not on file  Food Insecurity: Not on file  Transportation Needs: No Transportation Needs (03/31/2023)   Received from [facility] - Transportation    In the past 12 months, has lack of transportation kept you from medical appointments or from getting medications?: No    Lack of Transportation (Non-Medical): No  Physical Activity: Not on file  Stress: Not on file  Social Connections: Not on file  Intimate Partner Violence: Not on file    Family History  Problem Relation Age of Onset   Colon cancer Mother        Objective: Vitals:   06/15/23 1322  BP: 112/74  Pulse: 72     Physical Exam Vitals reviewed.  Constitutional:      Appearance: Normal appearance. He is obese.  Cardiovascular:     Rate and Rhythm: Normal rate and regular rhythm.     Heart sounds: Normal heart sounds.  Pulmonary:     Effort: Pulmonary effort is normal. No respiratory distress.     Breath sounds: Normal breath sounds.  Abdominal:     General: There is no distension.     Palpations: Abdomen is soft.     Tenderness: There is no abdominal tenderness. There is no right CVA tenderness or left CVA tenderness.  Neurological:     Mental Status: He is alert.     Lab Results:  PSA No results found for: "PSA" No results found for: "TESTOSTERONE"  UA has 6-10 WBC, >30 RBC and few bacteria.   Studies/Results: CT ABDOMEN PELVIS W WO CONTRAST  Result Date: 06/14/2023 CLINICAL DATA:  Hematuria, history of kidney stones EXAM: CT ABDOMEN AND PELVIS WITHOUT AND WITH CONTRAST TECHNIQUE:  Multidetector CT imaging of the abdomen and pelvis was performed following the standard protocol before and following the bolus administration of intravenous contrast. RADIATION DOSE REDUCTION: This exam was performed according to the departmental  dose-optimization program which includes automated exposure control, adjustment of the mA and/or kV according to patient size and/or use of iterative reconstruction technique. CONTRAST:  OMNIPAQUE IOHEXOL 300 MG/ML  SOLN COMPARISON:  MRI abdomen dated 03/25/2023. CT abdomen/pelvis dated 12/30/2021. FINDINGS: Lower chest: Lung bases are clear. Hepatobiliary: Prominent right hepatic lobe with atrophic left hepatic lobe, chronic. No focal hepatic lesion is seen. Gallbladder is unremarkable. No intrahepatic or extrahepatic ductal dilatation. Indwelling common duct stent. Pancreas: Within normal limits. Spleen: Within normal limits. Adrenals/Urinary Tract: Adrenal glands are within normal limits. Left upper pole renal sinus cysts, benign. No follow-up is recommended. 13 mm nonobstructing right lower pole renal calculus (series 5/image 62). Two calculi in the left renal collecting system measuring up to 8 mm (series 5/image 2). Two nonobstructing left lower pole renal calculi measuring up to 14 mm (series 5/image 43). Additional 10 mm calculus in the left renal collecting system (series 5/image 43). No distal ureteral calculi or hydronephrosis. Bladder is within normal limits. Stomach/Bowel: Stomach is within normal limits. No evidence of bowel obstruction. Appendix is not discretely visualized. Wall thickening extending from the ascending colon to the rectum, favored to be chronic, possibly related to ulcerative colitis. Vascular/Lymphatic: No evidence of abdominal aortic aneurysm. Atherosclerotic calcifications of the abdominal aorta and branch vessels, although vessels remain patent. No suspicious abdominopelvic lymphadenopathy. Reproductive: Prostate is unremarkable.  Other: No abdominopelvic ascites. Postsurgical changes related to prior upper abdominal ventral hernia repair. Additional tiny fat containing right paramidline periumbilical hernia (series 5/image 61). Musculoskeletal: Degenerative changes of the visualized thoracolumbar spine. IMPRESSION: Bilateral nonobstructing renal calculi, measuring up to 14 mm, as above. No hydronephrosis. Chronic colonic wall thickening, possibly reflecting ulcerative colitis. Additional ancillary findings as above. Electronically Signed   By: Charline Bills M.D.   On: 06/14/2023 02:26   MR ABDOMEN MRCP W WO CONTAST  Result Date: 03/26/2023 CLINICAL DATA:  67 year old male with history of jaundice. EXAM: MRI ABDOMEN WITHOUT AND WITH CONTRAST (INCLUDING MRCP) TECHNIQUE: Multiplanar multisequence MR imaging of the abdomen was performed both before and after the administration of intravenous contrast. Heavily T2-weighted images of the biliary and pancreatic ducts were obtained, and three-dimensional MRCP images were rendered by post processing. CONTRAST:  10mL GADAVIST GADOBUTROL 1 MMOL/ML IV SOLN COMPARISON:  Abdominal MRI 03/09/2023. Abdominal ultrasound 03/24/2023. FINDINGS: Comment: Portions of today's examination are limited by considerable patient respiratory motion. Lower chest: Unremarkable. Hepatobiliary: Atrophy of the left lobe of the liver again noted. No suspicious appearing hepatic lesions are noted. MRCP images are limited by considerable patient motion. With these limitations in mind, there is no significant intra or extrahepatic biliary ductal dilatation. Mild increased T2 signal intensity is noted adjacent to the portal triads, suggesting periportal edema. Gallbladder is unremarkable in appearance. Pancreas: No pancreatic mass. No pancreatic ductal dilatation. No pancreatic or peripancreatic fluid collections or inflammatory changes. Spleen:  Unremarkable. Adrenals/Urinary Tract: Multiple small parapelvic cysts are  noted in both kidneys (benign requiring no imaging follow-up). No aggressive appearing renal lesions are noted. Expansion of the renal sinus fat bilaterally, unusual, but similar to prior studies and presumably benign. There are multiple filling defects associated with the collecting systems of both kidneys, presumably nonobstructive calculi, measuring up to 1.3 cm in the lower pole collecting system of the right kidney and 1.1 cm in the lower pole collecting system of the left kidney, similar to remote prior CT examination 12/30/2021. No hydroureteronephrosis in the visualized portions of the abdomen. Bilateral adrenal glands are normal in appearance.  Stomach/Bowel: Numerous areas of susceptibility artifact are noted in the upper abdomen adjacent to the distal stomach related to surgical clips from prior partial gastrectomy. Visualized portions are otherwise unremarkable. Vascular/Lymphatic: No aneurysm identified in the visualized abdominal vasculature. No lymphadenopathy noted in the abdomen. Other: No significant volume of ascites noted in the visualized portions of the peritoneal cavity. Musculoskeletal: No aggressive appearing osseous lesions are noted in the visualized portions of the skeleton. IMPRESSION: 1. Mild periportal edema in the liver, which is a nonspecific finding that can be seen in the setting of hepatitis. Correlation with liver function tests is recommended. 2. No other acute findings to account for the patient's history of jaundice. Specifically, no choledocholithiasis or findings to suggest biliary obstruction. 3. Chronic atrophy of the left lobe of the liver, similar to prior studies. 4. Numerous nonobstructive calculi in the collecting systems of both kidneys. 5. Additional incidental findings, as above. Electronically Signed   By: Trudie Reed M.D.   On: 03/26/2023 09:31   US Abdomen Limited RUQ (LIVER/GB)  Result Date: 03/24/2023 CLINICAL DATA:  Right upper quadrant abdominal pain  EXAM: ULTRASOUND ABDOMEN LIMITED RIGHT UPPER QUADRANT COMPARISON:  Previous studies including the MRI none on 03/09/2023 FINDINGS: Gallbladder: No gallstones or wall thickening visualized. No sonographic Murphy sign noted by sonographer. Common bile duct: Diameter: 3.3 mm Liver: Left lobe of liver is not visualized consistent with history of atrophy. There is increased echogenicity in liver. No focal abnormalities are seen in visualized portions of liver. Portal vein is patent on color Doppler imaging with normal direction of blood flow towards the liver. Other: None. IMPRESSION: No acute findings are seen in right upper quadrant abdomen sonogram. Electronically Signed   By: Ernie Avena M.D.   On: 03/24/2023 17:40    I reviewed a CT from 4/23 and the stones were present and similar in size then.   I have reviewed labs and imaging from [facility] along with notes.    Assessment & Plan: Bilateral renal stones with hematuria.  I discussed options including ESWL and URS.   I will get him set up for bilateral ureteroscopy with laser and stenting and reviewed the risks of bleeding, infection,  ureteral injury, need for a staged or secondary procedure, thrombotic events and anesthetic consultation.    History of bladder neoplasm.    No orders of the defined types were placed in this encounter.    Orders Placed This Encounter  Procedures   Microscopic Examination   Urinalysis, Routine w reflex microscopic      Return for Will scheduled bilateral ureteroscopy. .   CC: Benita Stabile, MD      Bjorn Pippin 06/16/2023

## 2023-06-16 LAB — URINALYSIS, ROUTINE W REFLEX MICROSCOPIC
Bilirubin, UA: NEGATIVE
Glucose, UA: NEGATIVE
Ketones, UA: NEGATIVE
Nitrite, UA: NEGATIVE
Specific Gravity, UA: 1.025 (ref 1.005–1.030)
Urobilinogen, Ur: 0.2 mg/dL (ref 0.2–1.0)
pH, UA: 6 (ref 5.0–7.5)

## 2023-06-16 LAB — MICROSCOPIC EXAMINATION: RBC, Urine: >30 /[HPF] — AB (ref 0–2)

## 2023-06-16 MED ORDER — CIPROFLOXACIN HCL 500 MG PO TABS
500.0000 mg | ORAL_TABLET | Freq: Once | ORAL | Status: DC
Start: 2023-06-16 — End: 2023-06-16

## 2023-06-21 ENCOUNTER — Other Ambulatory Visit: Payer: Self-pay | Admitting: Urology

## 2023-06-21 ENCOUNTER — Encounter: Payer: Medicare Other | Attending: Gastroenterology | Admitting: Internal Medicine

## 2023-06-21 VITALS — BP 107/77 | HR 95 | Temp 98.1°F | Resp 18

## 2023-06-21 DIAGNOSIS — K51019 Ulcerative (chronic) pancolitis with unspecified complications: Secondary | ICD-10-CM | POA: Diagnosis not present

## 2023-06-21 MED ORDER — VEDOLIZUMAB 300 MG IV SOLR
300.0000 mg | Freq: Once | INTRAVENOUS | Status: AC
  Administered 2023-06-21: 300 mg via INTRAVENOUS
  Filled 2023-06-21: qty 5

## 2023-06-21 NOTE — Progress Notes (Signed)
Diagnosis: Crohn's Disease  Provider:  Katrinka Blazing MD  Procedure: IV Infusion  IV Type: Peripheral, IV Location: L Hand  Entyvio (Vedolizumab), Dose: 300 mg  Infusion Start Time: 0938  Infusion Stop Time: 1018  Post Infusion IV Care: Observation period completed  Discharge: Condition: Good, Destination: Home . AVS Provided  Performed by:  Feliberto Harts, LPN

## 2023-06-22 ENCOUNTER — Other Ambulatory Visit: Payer: Medicare Other | Admitting: Urology

## 2023-06-22 DIAGNOSIS — K51019 Ulcerative (chronic) pancolitis with unspecified complications: Secondary | ICD-10-CM | POA: Diagnosis not present

## 2023-06-22 DIAGNOSIS — K8301 Primary sclerosing cholangitis: Secondary | ICD-10-CM | POA: Diagnosis not present

## 2023-06-28 DIAGNOSIS — K8070 Calculus of gallbladder and bile duct without cholecystitis without obstruction: Secondary | ICD-10-CM | POA: Diagnosis not present

## 2023-06-28 DIAGNOSIS — K8301 Primary sclerosing cholangitis: Secondary | ICD-10-CM | POA: Diagnosis not present

## 2023-06-30 ENCOUNTER — Telehealth (INDEPENDENT_AMBULATORY_CARE_PROVIDER_SITE_OTHER): Payer: Self-pay | Admitting: *Deleted

## 2023-06-30 NOTE — Telephone Encounter (Signed)
patient was in reminder file fo repeat LFTs in October. Pt has had labs in epic ordered by michelle christine mcghee.

## 2023-07-03 ENCOUNTER — Ambulatory Visit (INDEPENDENT_AMBULATORY_CARE_PROVIDER_SITE_OTHER): Payer: Medicare Other | Admitting: Gastroenterology

## 2023-07-03 ENCOUNTER — Encounter (INDEPENDENT_AMBULATORY_CARE_PROVIDER_SITE_OTHER): Payer: Self-pay | Admitting: Gastroenterology

## 2023-07-03 VITALS — BP 118/81 | HR 111 | Temp 98.2°F | Ht 75.0 in | Wt 298.5 lb

## 2023-07-03 DIAGNOSIS — K5190 Ulcerative colitis, unspecified, without complications: Secondary | ICD-10-CM

## 2023-07-03 DIAGNOSIS — K8301 Primary sclerosing cholangitis: Secondary | ICD-10-CM | POA: Diagnosis not present

## 2023-07-03 DIAGNOSIS — K5100 Ulcerative (chronic) pancolitis without complications: Secondary | ICD-10-CM

## 2023-07-03 DIAGNOSIS — E559 Vitamin D deficiency, unspecified: Secondary | ICD-10-CM

## 2023-07-03 NOTE — Patient Instructions (Signed)
Please continue entyvio every 4 weeks for now. Please keep scheduled colonoscopy with [facility] and follow up ERCP with them as well  Follow up in January  It was a pleasure to see you today. I want to create trusting relationships with patients and provide genuine, compassionate, and quality care. I truly value your feedback! please be on the lookout for a survey regarding your visit with me today. I appreciate your input about our visit and your time in completing this!    Darnesha Diloreto L. Jeanmarie Hubert, MSN, APRN, AGNP-C Adult-Gerontology Nurse Practitioner [facility]

## 2023-07-03 NOTE — Progress Notes (Addendum)
Referring Provider: Benita Stabile, MD Primary Care Physician:  Benita Stabile, MD Primary GI Physician: Dr. Levon Hedger   Chief Complaint  Patient presents with   ulcerative pancolitis    Follow up on pancolitis. States doing well since putting in stent.    HPI:   Alex Thomas is a 67 y.o. 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  Brushings were performed (negative for malignancy) and stent was placed to allow adequate drainage.  Cholangiogram was worrisome for possible cholangiocarcinoma.  CEA was 6.3 and CA 19-9 was 128. Patient was scheduled for ERCP on 8/20.  He reported that he was feeling better, having 2 BMs per day, stools soft but formed.   Seen By Dr. Secundino Ginger on 04/13/2023.  After review of the notes,  no mention of the low-grade dysplasia found on January 2024.  Dr. Levon Hedger tried to communicate with Dr. Secundino Ginger but unfortunately was not able to get in touch with him,  colonoscopy and pathology reports were resent to dr. Lorne Skeens office but no communication back was received  Recent ERCP on 8/20 as outlined below, recommended repeat ERCP in November.  Seen by Dr. Secundino Ginger on 10/10 in follow up.  Last labs on 10/16 with Alk phos 369, AST 27, ALT 34, T bili 0.5, INR 1,   Present:  Patient states he has upcoming colonoscopy with Dr. Secundino Ginger at [facility] on 11/8.   Having on average 1 BM per day, he notes  since recent biliary stent placement, he has more solid stools, previously having some diarrhea. He states he is feeling well but has not noticed any difference in being on Entyvio.   He has GB removal upcoming in December with Dr. Ander Slade at [facility], as recommended by [facility] after most recent ERCP. He reports they are planning to do liver biopsy possibly at time of next ERCP.   He denies any nausea or vomiting, no abdominal pain. Has actually gained a few pounds. No rectal bleeding or melena. Appetite is good. No EIMs.    Last Colonoscopy:09/2022   The perianal and digital rectal examinations were normal.   The terminal ileum appeared normal.   Inflammation was found in a continuous and circumferential pattern from the descending colon ( 50 cm from anal verge) to the cecum. This was graded as Mayo Score 1 ( mild, with erythema, decreased vascular pattern, mild friability) , and when compared to the previous examination, the findings are improved as there were no erosions and vascularity appeared to be more preserved. No inflammation ( Mayo 0) was found distal to 50 cm. Biopsies were taken with a cold forceps for histology every 10 cm.   One 15 to 20 mm mucosal nodularity was found in the mid ascending colon. Area was successfully injected with 20 mL Eleview for lesion assessment, but the lesion could not be lifted adequately and remained very flat so no polypectomy could be performed. Biopsies were taken with a  cold forceps for histology. A contralateral area 2 cm distal to the nodularity was tattooed with an injection of 1 mL of Spot ( carbon black) .   The retroflexed view of the distal rectum and anal verge was normal and showed no anal or rectal abnormalities.   FINAL MICROSCOPIC DIAGNOSIS:  A. COLON, ASCENDING, NODULE, BIOPSY: - Consistent with inflammatory polyp - Negative for dysplasia  B. COLON, 90 CM, BIOPSY: - Mildly active chronic colitis, consistent with patient's clinical history  of ulcerative colitis - Negative for granulomas or dysplasia  C. COLON, 80 CM, BIOPSY: - Mildly active chronic colitis, consistent with patient's clinical history of ulcerative colitis - Negative for granulomas or dysplasia  D. COLON, 70 CM, BIOPSY: - Mildly active chronic colitis, consistent with patient's clinical history of ulcerative colitis - Negative for granulomas or dysplasia  E. COLON, 60 CM, BIOPSY: - Mildly active chronic colitis, consistent with patient's clinical history of ulcerative colitis - Negative for granulomas or dysplasia  F. COLON, 50 CM, BIOPSY: - Low-grade dysplasia arising in background of mildly active chronic ulcerative colitis, see comment - Negative for granulomas  G. COLON, 40 CM, BIOPSY: - Mildly active chronic colitis, consistent with patient's clinical history of ulcerative colitis - Negative for granulomas or dysplasia  H. COLON, 30 CM, BIOPSY: - Mildly active chronic colitis, consistent with patient's clinical history of ulcerative colitis - Negative for granulomas or dysplasia  I. COLON, 20 CM, BIOPSY: - Mildly active chronic colitis, consistent with patient's clinical history of ulcerative colitis - Negative for granulomas or dysplasia  J. COLON, 10 CM, BIOPSY: - Inactive chronic proctitis, consistent with patient's clinical history of ulcerative colitis - Negative for granulomas or dysplasia  COMMENT:  F.  Some foci show increased cytologic atypia than usually seen in low-grade dysplasia but clear-cut high-grade dysplasia is not identified.  Dr. Kenyon Ana reviewed the case and concurs with the above diagnosis.    Presence of low-grade dysplasia at 50 cm -random biopsies.  This was confirmed by 2 pathologists.  Thorough discussion was held with the patient regarding the significance of low-grade dysplasia. discussed that this is classified as invisible dysplasia.  discussed the importance of having an evaluation at a tertiary  center such as [facility] with an inflammatory bowel disease group that can evaluate this further with chromoendoscopy and potential resection if the lesion is visible/resectable.  Also, depending on the findings, he may be better served if there is need for surgical intervention.   Last ERCP: 05/02/23  Impression: - The previously placed biliary stent had  spontaneously migrated out of the duct. - Prior biliary sphincterotomy appeared open. - A single mild biliary stricture was found in the  common hepatic duct. Cholangioscopy was performed.  This showed the stricture to be nodular and inflammed  in appearance. The stricture was biopsied with a  spybite forceps and brushed for cytology. Broad  differential for the stricture including inflammatory (favored) and malignant etiologies. Treated with placement of a 10 french plastic stent. - Choledocholithiasis was found. Removal was  accomplished by balloon extraction and cholangioscopy mini-spybasket retrieval. Small stones likely remain in the intrahepatic ducts. - A pancreatogram was not attempted.  Recommendation: - Discharge patient to home (ambulatory). - Watch for pancreatitis, bleeding, perforation, and  cholangitis. - Await cytology and pathology results. - Consider referral to a surgeon for discussion of  cholecystectomy. - Repeat ERCP in 3 months to remove stent. - Cipro (ciprofloxacin) 500 mg PO BID for 5 days.  Past Medical History:  Diagnosis Date   Bladder cancer [facility])    age 24   History of kidney stones    Multiple gastric ulcers     Past Surgical History:  Procedure Laterality Date   APPENDECTOMY     BIOPSY  11/23/2021   Procedure: BIOPSY;  Surgeon: Dolores Frame, MD;  [facility];  Service: Gastroenterology;;   BIOPSY  09/23/2022   Procedure: BIOPSY;  Surgeon: Dolores Frame, MD;  [facility];  Service: Gastroenterology;;   COLONOSCOPY WITH PROPOFOL N/A 11/23/2021   Procedure:  COLONOSCOPY WITH PROPOFOL;  Surgeon: Dolores Frame, MD;  [facility];  Service: Gastroenterology;  Laterality: N/A;  805 ASA 1   COLONOSCOPY WITH PROPOFOL N/A 09/23/2022   Procedure: COLONOSCOPY WITH PROPOFOL;  Surgeon: Dolores Frame, MD;  [facility];  Service: Gastroenterology;  Laterality: N/A;  1:00pm, asa 2, pt knows to arrive at 10:00   GASTRECTOMY     HERNIA REPAIR     KIDNEY STONE SURGERY     POLYPECTOMY  11/23/2021   Procedure: POLYPECTOMY;  Surgeon: Dolores Frame, MD;  [facility];  Service: Gastroenterology;;   Susa Day  09/23/2022   Procedure: Susa Day;  Surgeon: Marguerita Merles, Reuel Boom, MD;  [facility];  Service: Gastroenterology;;    Current Outpatient Medications  Medication Sig Dispense Refill   Cyanocobalamin (VITAMIN B-12) 1000 MCG SUBL Place 2 tablets (2,000 mcg total) under the tongue daily. 180 tablet 1   folic acid (FOLVITE) 1 MG tablet TAKE ONE (1) TABLET BY MOUTH EVERY DAY 90 tablet 1   tamsulosin (FLOMAX) 0.4 MG CAPS capsule Take 0.4 mg by mouth daily.     vedolizumab (ENTYVIO) 300 MG injection Inject 300 mg into the vein every 8 (eight) weeks.     VITAMIN D PO Take 2 tablets by mouth daily. 10,000 international units daily total     No current facility-administered medications for this visit.    Allergies as of 07/03/2023   (No Known Allergies)    Family History  Problem Relation Age of Onset   Colon cancer Mother     Social History   Socioeconomic History   Marital status: Married    Spouse name: Not on file   Number of children: Not on file   Years of education: Not on file   Highest education level: Not on file  Occupational History   Not on file  Tobacco Use   Smoking status: Never    Passive exposure: Past   Smokeless tobacco: Never  Vaping Use   Vaping status: Never Used  Substance and Sexual Activity   Alcohol use: Not Currently   Drug use:  Not Currently   Sexual activity: Not Currently  Other Topics Concern   Not on file  Social History Narrative   Not on file   Social Determinants of Health   Financial Resource Strain: Not on file  Food Insecurity: Not on file  Transportation Needs: No Transportation Needs (03/31/2023)   Received from [facility]   PRAPARE - Transportation    In the past 12 months, has lack of transportation kept you from medical appointments or from getting medications?: No    Lack of Transportation (Non-Medical): No  Physical Activity: Not on file  Stress: Not on file  Social Connections: Not on file    Review of systems General: negative for malaise, night sweats, fever, chills, weight loss Neck: Negative  for lumps, goiter, pain and significant neck swelling Resp: Negative for cough, wheezing, dyspnea at rest CV: Negative for chest pain, leg swelling, palpitations, orthopnea GI: denies melena, hematochezia, nausea, vomiting, diarrhea, constipation, dysphagia, odyonophagia, early satiety or unintentional weight loss.  MSK: Negative for joint pain or swelling, back pain, and muscle pain. Derm: Negative for itching or rash Psych: Denies depression, anxiety, memory loss, confusion. No homicidal or suicidal ideation.  Heme: Negative for prolonged bleeding, bruising easily, and swollen nodes. Endocrine: Negative for cold or heat intolerance, polyuria, polydipsia and goiter. Neuro: negative for tremor, gait imbalance, syncope and seizures. The remainder of the review of systems is noncontributory.  Physical Exam: BP 118/81 (BP Location: Left Arm, Patient Position: Sitting, Cuff Size: Large)   Pulse (!) 111   Temp 98.2 F (36.8 C) (Oral)   Ht 6\' 3"  (1.905 m)   Wt 298 lb 8 oz (135.4 kg)   BMI 37.31 kg/m  General:   Alert and oriented. No distress noted. Pleasant and cooperative.  Head:  Normocephalic and atraumatic. Eyes:  Conjuctiva clear without scleral icterus. Mouth:  Oral  mucosa pink and moist. Good dentition. No lesions. Heart: Normal rate and rhythm, s1 and s2 heart sounds present.  Lungs: Clear lung sounds in all lobes. Respirations equal and unlabored. Abdomen:  +BS, soft, non-tender and non-distended. No rebound or guarding. No HSM or masses noted. Derm: No palmar erythema or jaundice Msk:  Symmetrical without gross deformities. Normal posture. Extremities:  Without edema. Neurologic:  Alert and  oriented x4 Psych:  Alert and cooperative. Normal mood and affect.  Invalid input(s): "6 MONTHS"   ASSESSMENT: ROBBIN STYLES is a 67 y.o. male presenting today for follow up of UC and PSC  UC: currently on entyvio, having 1 solid stool per day since biliary stenting. No abdominal pain, rectal bleeding or melena. No EIMs. He is up to date on TB testing and Hep B testing. Last CRP in July was 12.09 though non specific and likely secondary to biliary issues at that time. His last vit D level was 20.3, advised to increase to 10k units per day and have repeat Vit D labs again in 3 months. Has tolerated Entyvio well. Last colonoscopy in early 2024 with mild active chronic colitis as well as some low grade dysplasia at 50cm. Was referred to GI at [facility] for further evaluation. He has seen Dr. Secundino Ginger and has plan for colonoscopy coming up next month.  Will continue with Entyio for now and await any further recommendations they may have thereafter.   PSC: significantly elevated LFTs in July 2024 which required MRCP with Dr. Shana Chute at [facility] with biliary stent placement, cytology negative for malignancy, despite elevated CEA and CA 19-9. Repeat ERCP on 8/20 revealed  previously placed biliary stent had spontaneously migrated out of the duct, Prior biliary sphincterotomy appeared open, single mild biliary stricture was found in the  common hepatic duct. Cholangioscopy was performed.  This showed the stricture to be nodular and inflammed  in appearance thought secondary to  inflammatory process, Treated with placement of a 10 french plastic stent.Choledocholithiasis was found. He was recommended to have Repeat ERCP in 3 months, scheduled for 12/2, started on cipro x5 days at that time. Referred to surgery for GB removal which patient reports is planned for December. Reassuringly his LFTs have trended down nicely. Last on 10/16 with normal aminotransferases, alk phos still elevated at 369, down from 647 in July. He has no upper abdominal pain, nausea,  vomiting, pruritus or jaundice. Should continue to follow with Dr. Shana Chute at [facility]. Will follow for results of upcoming ERCP next month.    PLAN:  Continue with entyvio every 4 weeks 2. Proceed with Colonoscopy next month with [facility]  3. [facility] to [facility] with [facility] regarding [facility] and GB removal 4. Repeat Vitamin D levels again in December 5. Continue vitamin d supplementation  All questions were answered, patient verbalized understanding and is in agreement with plan as outlined above.   Follow Up: January 2025  Alex Thomas L. Jeanmarie Hubert, MSN, APRN, AGNP-C Adult-Gerontology Nurse Practitioner [facility]  I have reviewed the note and agree with the APP's assessment as described in this progress note  I have discussed with Dr. Secundino Ginger the most recent colonoscopy that Mr. Saah had performed.  Dr. Clyde Lundborg is aware of the presence of invisible dysplasia, they will monitor for this and biopsy the rest of his colon.  As he had adequate levels of Entyvio (value in April 27, 2023 was 55, unclear if he had this checked the day before his next dose of Entyvio), we will determine if he has any ongoing active inflammation in his colon during his upcoming colonoscopy at [facility].  Will need to keep close monitoring of his colon given his increased risk of colorectal cancer and the presence of invisible low-grade dysplasia.  Will have a low threshold to switch him to another agent if he has active inflammation in  his colon.  Katrinka Blazing, MD Gastroenterology and Hepatology [facility]astroenter[facility]

## 2023-07-07 ENCOUNTER — Ambulatory Visit: Payer: Medicare Other

## 2023-07-07 ENCOUNTER — Ambulatory Visit: Payer: Medicare Other | Attending: Cardiology | Admitting: Cardiology

## 2023-07-07 ENCOUNTER — Encounter: Payer: Self-pay | Admitting: Cardiology

## 2023-07-07 VITALS — BP 102/78 | HR 76 | Ht 75.0 in | Wt 296.0 lb

## 2023-07-07 DIAGNOSIS — I480 Paroxysmal atrial fibrillation: Secondary | ICD-10-CM

## 2023-07-07 DIAGNOSIS — I4891 Unspecified atrial fibrillation: Secondary | ICD-10-CM

## 2023-07-07 NOTE — Progress Notes (Signed)
Clinical Summary Alex Thomas is a 67 y.o.male seen today as a new patient for the following medical problems.   1.PAF - new diagnosis 04/2023 during procedure -he brings EKG from [facility] dated 05/02/2023 which shows afib rate 66 - at pcp f/u was back in SR - interestingly during 06/2023 GI appt HR was 111 CHADS2Vasc score is 1 for age - denies any palpitations    2.History of ulcerative colitis   3. History of PUD with partial gastric resection age 54  4.PSC with stricturing and cholanigits s/p ERCP with stent placement - from notes plans for lap chole  5. Kidney stones - extensive history of stones.   Past Medical History:  Diagnosis Date   Bladder cancer [facility])    age 28   History of kidney stones    Multiple gastric ulcers      No Known Allergies   Current Outpatient Medications  Medication Sig Dispense Refill   Cyanocobalamin (VITAMIN B-12) 1000 MCG SUBL Place 2 tablets (2,000 mcg total) under the tongue daily. 180 tablet 1   folic acid (FOLVITE) 1 MG tablet TAKE ONE (1) TABLET BY MOUTH EVERY DAY 90 tablet 1   tamsulosin (FLOMAX) 0.4 MG CAPS capsule Take 0.4 mg by mouth daily.     vedolizumab (ENTYVIO) 300 MG injection Inject 300 mg into the vein every 8 (eight) weeks.     VITAMIN D PO Take 2 tablets by mouth daily. 10,000 international units daily total     No current facility-administered medications for this visit.     Past Surgical History:  Procedure Laterality Date   APPENDECTOMY     BIOPSY  11/23/2021   Procedure: BIOPSY;  Surgeon: Dolores Frame, MD;  [facility];  Service: Gastroenterology;;   BIOPSY  09/23/2022   Procedure: BIOPSY;  Surgeon: Dolores Frame, MD;  [facility];  Service: Gastroenterology;;   COLONOSCOPY WITH PROPOFOL N/A 11/23/2021   Procedure: COLONOSCOPY WITH PROPOFOL;  Surgeon: Dolores Frame, MD;  [facility];  Service: Gastroenterology;  Laterality: N/A;  805  ASA 1   COLONOSCOPY WITH PROPOFOL N/A 09/23/2022   Procedure: COLONOSCOPY WITH PROPOFOL;  Surgeon: Dolores Frame, MD;  [facility];  Service: Gastroenterology;  Laterality: N/A;  1:00pm, asa 2, pt knows to arrive at 10:00   GASTRECTOMY     HERNIA REPAIR     KIDNEY STONE SURGERY     POLYPECTOMY  11/23/2021   Procedure: POLYPECTOMY;  Surgeon: Dolores Frame, MD;  [facility];  Service: Gastroenterology;;   Susa Day  09/23/2022   Procedure: Susa Day;  Surgeon: Dolores Frame, MD;  [facility];  Service: Gastroenterology;;     No Known Allergies    Family History  Problem Relation Age of Onset   Colon cancer Mother      Social History Mr. Perteet reports that he has never smoked. He has been exposed to tobacco smoke. He has never used smokeless tobacco. Mr. Casillo reports that he does not currently use alcohol.   Review of Systems CONSTITUTIONAL: No weight loss, fever, chills, weakness or fatigue.  HEENT: Eyes: No visual loss, blurred vision, double vision or yellow sclerae.No hearing loss, sneezing, congestion, runny nose or sore throat.  SKIN: No rash or itching.  CARDIOVASCULAR: per hpi RESPIRATORY: No shortness of breath, cough or sputum.  GASTROINTESTINAL: No anorexia, nausea, vomiting or diarrhea. No abdominal pain or blood.  GENITOURINARY: No burning  on urination, no polyuria NEUROLOGICAL: No headache, dizziness, syncope, paralysis, ataxia, numbness or tingling in the extremities. No change in bowel or bladder control.  MUSCULOSKELETAL: No muscle, back pain, joint pain or stiffness.  LYMPHATICS: No enlarged nodes. No history of splenectomy.  PSYCHIATRIC: No history of depression or anxiety.  ENDOCRINOLOGIC: No reports of sweating, cold or heat intolerance. No polyuria or polydipsia.  Marland Kitchen   Physical Examination Today's Vitals   07/07/23 1041  BP: 102/78  Pulse: 76  SpO2: 97%  Weight: 296 lb  (134.3 kg)  Height: 6\' 3"  (1.905 m)   Body mass index is 37 kg/m.  Gen: resting comfortably, no acute distress HEENT: no scleral icterus, pupils equal round and reactive, no palptable cervical adenopathy,  CV: RRR, no m/rg no jvd Resp: Clear to auscultation bilaterally GI: abdomen is soft, non-tender, non-distended, normal bowel sounds, no hepatosplenomegaly MSK: extremities are warm, no edema.  Skin: warm, no rash Neuro:  no focal deficits Psych: appropriate affect     Assessment and Plan  1.PAF - asymptotomatic, the one documented episode was during GI endoscopy procedure at [facility], rate controlled afib at the time.  - interestingly at his GI visit earlier this month HR was 111 by vitals - plan for 2 week zio patch to evaluate Afib burden and rates - CHADS2Vasc score is just 1 based on current information, would not start anticoag. In the futue if ever considered would need to clear with GI given his PUD, ulcerative colitis, and liver history - obtain echo   F/u 3 months      Antoine Poche, M.D.

## 2023-07-07 NOTE — Patient Instructions (Signed)
Medication Instructions:  Your physician recommends that you continue on your current medications as directed. Please refer to the Current Medication list given to you today.  *If you need a refill on your cardiac medications before your next appointment, please call your pharmacy*   Lab Work: None If you have labs (blood work) drawn today and your tests are completely normal, you will receive your results only by: MyChart Message (if you have MyChart) OR A paper copy in the mail If you have any lab test that is abnormal or we need to change your treatment, we will call you to review the results.   Testing/Procedures: Your physician has requested that you have an echocardiogram. Echocardiography is a painless test that uses sound waves to create images of your heart. It provides your doctor with information about the size and shape of your heart and how well your heart's chambers and valves are working. This procedure takes approximately one hour. There are no restrictions for this procedure. Please do NOT wear cologne, perfume, aftershave, or lotions (deodorant is allowed). Please arrive 15 minutes prior to your appointment time.    Follow-Up: At [facility], you and your health needs are our priority.  As part of our continuing mission to provide you with exceptional heart care, we have created designated Provider Care Teams.  These Care Teams include your primary Cardiologist (physician) and Advanced Practice Providers (APPs -  Physician Assistants and Nurse Practitioners) who all work together to provide you with the care you need, when you need it.  We recommend signing up for the patient portal called "MyChart".  Sign up information is provided on this After Visit Summary.  MyChart is used to connect with patients for Virtual Visits (Telemedicine).  Patients are able to view lab/test results, encounter notes, upcoming appointments, etc.  Non-urgent messages can be sent to your  provider as well.   To learn more about what you can do with MyChart, go to [email or web address].    Your next appointment:   3 month(s)  Provider:   You will see one of the following Advanced Practice Providers on your designated Care Team:   [name], PA-C  [name], PA-C    Other Instructions Alex Thomas- Long Term Monitor Instructions   Your physician has requested you wear your ZIO patch monitor___14____days.   This is a single patch monitor.  [employer] supplies one patch monitor per enrollment.  Additional stickers are not available.   Please do not apply patch if you will be having a Nuclear Stress Test, Echocardiogram, Cardiac CT, MRI, or Chest Xray during the time frame you would be wearing the monitor. The patch cannot be worn during these tests.  You cannot remove and re-apply the ZIO XT patch monitor.   Your ZIO patch monitor will be sent USPS Priority mail from [facility] directly to your home address. The monitor may also be mailed to a PO BOX if home delivery is not available.   It may take 3-5 days to receive your monitor after you have been enrolled.   Once you have received you monitor, please review enclosed instructions.  Your monitor has already been registered assigning a specific monitor serial # to you.   Applying the monitor   Shave hair from upper left chest.   Hold abrader disc by orange tab.  Rub abrader in 40 strokes over left upper chest as indicated in your monitor instructions.   Clean area with 4 enclosed alcohol pads .  Use all pads to assure are is cleaned thoroughly.  Let dry.   Apply patch as indicated in monitor instructions.  Patch will be place under collarbone on left side of chest with arrow pointing upward.   Rub patch adhesive wings for 2 minutes.Remove white label marked "1".  Remove white label marked "2".  Rub patch adhesive wings for 2 additional minutes.   While looking in a mirror, press and release button in  center of patch.  A small green light will flash 3-4 times .  This will be your only indicator the monitor has been turned on.     Do not shower for the first 24 hours.  You may shower after the first 24 hours.   Press button if you feel a symptom. You will hear a small click.  Record Date, Time and Symptom in the Patient Log Book.   When you are ready to remove patch, follow instructions on last 2 pages of Patient Log Book.  Stick patch monitor onto last page of Patient Log Book.   Place Patient Log Book in [place] box.  Use locking tab on box and tape box closed securely.  The [employer] has [employer] on it.  Please place in mailbox as soon as possible.  Your physician should have your test results approximately 7 days after the monitor has been mailed back to [facility].   Call [facility] Customer Care at [phone] if you have questions regarding your ZIO XT patch monitor.  Call them immediately if you see an orange light blinking on your monitor.   If your monitor falls off in less than 4 days contact our Monitor department at [phone].  If your monitor becomes loose or falls off after 4 days call [employer] at [phone] for suggestions on securing your monitor.

## 2023-07-10 DIAGNOSIS — K8020 Calculus of gallbladder without cholecystitis without obstruction: Secondary | ICD-10-CM | POA: Diagnosis not present

## 2023-07-10 DIAGNOSIS — R748 Abnormal levels of other serum enzymes: Secondary | ICD-10-CM | POA: Diagnosis not present

## 2023-07-10 DIAGNOSIS — K831 Obstruction of bile duct: Secondary | ICD-10-CM | POA: Diagnosis not present

## 2023-07-10 DIAGNOSIS — K51019 Ulcerative (chronic) pancolitis with unspecified complications: Secondary | ICD-10-CM | POA: Diagnosis not present

## 2023-07-10 DIAGNOSIS — K8301 Primary sclerosing cholangitis: Secondary | ICD-10-CM | POA: Diagnosis not present

## 2023-07-11 NOTE — Progress Notes (Signed)
COVID Vaccine Completed:  Date of COVID positive in last 90 days:  PCP - Nita Sells, MD Cardiologist - Dina Rich, MD LOV 07/07/23 GI- Dr. Levon Hedger  Chest x-ray - 03/31/23 [facility]W EKG - 05/06/23 Epic Stress Test -  ECHO - 07/20/23 Epic Cardiac Cath -  Pacemaker/ICD device last checked: Spinal Cord Stimulator: Holter monitor- 07/07/23 Epic  Bowel Prep -   Sleep Study -  CPAP -   Fasting Blood Sugar -  Checks Blood Sugar _____ times a day  Last dose of GLP1 agonist-  N/A GLP1 instructions:  N/A   Last dose of SGLT-2 inhibitors-  N/A SGLT-2 instructions: N/A   Blood Thinner Instructions:  Time Aspirin Instructions: Last Dose:  Activity level:  Can go up a flight of stairs and perform activities of daily living without stopping and without symptoms of chest pain or shortness of breath.  Able to exercise without symptoms  Unable to go up a flight of stairs without symptoms of     Anesthesia review: a fib, OSA, CKD  Patient denies shortness of breath, fever, cough and chest pain at PAT appointment  Patient verbalized understanding of instructions that were given to them at the PAT appointment. Patient was also instructed that they will need to review over the PAT instructions again at home before surgery.

## 2023-07-11 NOTE — Patient Instructions (Signed)
SURGICAL WAITING ROOM VISITATION  Patients having surgery or a procedure may have no more than 2 support people in the waiting area - these visitors may rotate.    Children under the age of 51 must have an adult with them who is not the patient.  Due to an increase in RSV and influenza rates and associated hospitalizations, children ages 9 and under may not visit patients in [facility] hospitals.  If the patient needs to stay at the hospital during part of their recovery, the visitor guidelines for inpatient rooms apply. Pre-op nurse will coordinate an appropriate time for 1 support person to accompany patient in pre-op.  This support person may not rotate.    Please refer to the [facility] website for the visitor guidelines for Inpatients (after your surgery is over and you are in a regular room).    Your procedure is scheduled on: 07/25/23   Report to [facility] Main Entrance    Report to admitting at 5:15 AM   Call this number if you have problems the morning of surgery [phone]   Do not eat food or drink liquids :After Midnight.          If you have questions, please contact your surgeon's office.   FOLLOW BOWEL PREP AND ANY ADDITIONAL PRE OP INSTRUCTIONS YOU RECEIVED FROM YOUR SURGEON'S OFFICE!!!     Oral Hygiene is also important to reduce your risk of infection.                                    Remember - BRUSH YOUR TEETH THE MORNING OF SURGERY WITH YOUR REGULAR TOOTHPASTE  DENTURES WILL BE REMOVED PRIOR TO SURGERY PLEASE DO NOT APPLY "Poly grip" OR ADHESIVES!!!   Stop all vitamins and herbal supplements 7 days before surgery.   Take these medicines the morning of surgery with A SIP OF WATER: Tamsulosin              You may not have any metal on your body including jewelry, and body piercing             Do not wear lotions, powders, cologne, or deodorant              Men may shave face and neck.   Do not bring valuables to the hospital. [place]  IS NOT             RESPONSIBLE   FOR VALUABLES.   Contacts, glasses, dentures or bridgework may not be worn into surgery.  DO NOT BRING YOUR HOME MEDICATIONS TO THE HOSPITAL. PHARMACY WILL DISPENSE MEDICATIONS LISTED ON YOUR MEDICATION LIST TO YOU DURING YOUR ADMISSION IN THE HOSPITAL!    Patients discharged on the day of surgery will not be allowed to drive home.  Someone NEEDS to stay with you for the first 24 hours after anesthesia.              Please read over the following fact sheets you were given: IF YOU HAVE QUESTIONS ABOUT YOUR PRE-OP INSTRUCTIONS PLEASE CALL [name]    If you received a COVID test during your pre-op visit  it is requested that you wear a mask when out in public, stay away from anyone that may not be feeling well and notify your surgeon if you develop symptoms. If you test positive for Covid or have been in contact with anyone that  has tested positive in the last 10 days please notify you surgeon.    [place] - Preparing for Surgery Before surgery, you can play an important role.  Because skin is not sterile, your skin needs to be as free of germs as possible.  You can reduce the number of germs on your skin by washing with CHG (chlorahexidine gluconate) soap before surgery.  CHG is an antiseptic cleaner which kills germs and bonds with the skin to continue killing germs even after washing. Please DO NOT use if you have an allergy to CHG or antibacterial soaps.  If your skin becomes reddened/irritated stop using the CHG and inform your nurse when you arrive at Short Stay. Do not shave (including legs and underarms) for at least 48 hours prior to the first CHG shower.  You may shave your face/neck.  Please follow these instructions carefully:  1.  Shower with CHG Soap the night before surgery and the  morning of surgery.  2.  If you choose to wash your hair, wash your hair first as usual with your normal  shampoo.  3.  After you shampoo, rinse your  hair and body thoroughly to remove the shampoo.                             4.  Use CHG as you would any other liquid soap.  You can apply chg directly to the skin and wash.  Gently with a scrungie or clean washcloth.  5.  Apply the CHG Soap to your body ONLY FROM THE NECK DOWN.   Do   not use on face/ open                           Wound or open sores. Avoid contact with eyes, ears mouth and   genitals (private parts).                       Wash face,  Genitals (private parts) with your normal soap.             6.  Wash thoroughly, paying special attention to the area where your    surgery  will be performed.  7.  Thoroughly rinse your body with warm water from the neck down.  8.  DO NOT shower/wash with your normal soap after using and rinsing off the CHG Soap.                9.  Pat yourself dry with a clean towel.            10.  Wear clean pajamas.            11.  Place clean sheets on your bed the night of your first shower and do not  sleep with pets. Day of Surgery : Do not apply any lotions/deodorants the morning of surgery.  Please wear clean clothes to the hospital/surgery center.  FAILURE TO FOLLOW THESE INSTRUCTIONS MAY RESULT IN THE CANCELLATION OF YOUR SURGERY  PATIENT SIGNATURE_________________________________  NURSE SIGNATURE__________________________________  ________________________________________________________________________

## 2023-07-12 ENCOUNTER — Encounter (HOSPITAL_COMMUNITY): Payer: Self-pay

## 2023-07-12 ENCOUNTER — Other Ambulatory Visit: Payer: Self-pay

## 2023-07-12 ENCOUNTER — Encounter (HOSPITAL_COMMUNITY)
Admission: RE | Admit: 2023-07-12 | Discharge: 2023-07-12 | Disposition: A | Discharge status: Home / Self Care | Payer: Medicare Other | Source: Ambulatory Visit | Attending: Urology | Admitting: Urology

## 2023-07-12 DIAGNOSIS — Z01812 Encounter for preprocedural laboratory examination: Secondary | ICD-10-CM | POA: Insufficient documentation

## 2023-07-12 DIAGNOSIS — R310 Gross hematuria: Secondary | ICD-10-CM | POA: Insufficient documentation

## 2023-07-12 HISTORY — DX: Unspecified osteoarthritis, unspecified site: M19.90

## 2023-07-12 HISTORY — DX: Pneumonia, unspecified organism: J18.9

## 2023-07-12 HISTORY — DX: Cardiac arrhythmia, unspecified: I49.9

## 2023-07-12 LAB — COMPREHENSIVE METABOLIC PANEL
ALT: 32 U/L (ref 0–44)
AST: 34 U/L (ref 15–41)
Albumin: 3.3 g/dL — ABNORMAL LOW (ref 3.5–5.0)
Alkaline Phosphatase: 366 U/L — ABNORMAL HIGH (ref 38–126)
Anion gap: 7 (ref 5–15)
BUN: 14 mg/dL (ref 8–23)
CO2: 24 mmol/L (ref 22–32)
Calcium: 8.7 mg/dL — ABNORMAL LOW (ref 8.9–10.3)
Chloride: 105 mmol/L (ref 98–111)
Creatinine, Ser: 0.86 mg/dL (ref 0.61–1.24)
GFR, Estimated: >60 mL/min (ref >60)
Glucose, Bld: 97 mg/dL (ref 70–99)
Potassium: 4.1 mmol/L (ref 3.5–5.1)
Sodium: 136 mmol/L (ref 135–145)
Total Bilirubin: 0.7 mg/dL (ref 0.3–1.2)
Total Protein: 7.4 g/dL (ref 6.5–8.1)

## 2023-07-12 LAB — PROTIME-INR
INR: 0.9 (ref 0.8–1.2)
Prothrombin Time: 12.7 s (ref 11.4–15.2)

## 2023-07-17 ENCOUNTER — Telehealth: Payer: Self-pay | Admitting: Cardiology

## 2023-07-17 NOTE — Telephone Encounter (Signed)
   Pre-operative Risk Assessment    Patient Name: Alex Thomas  DOB: Apr 14, 1956 MRN: [id number]      Request for Surgical Clearance    Procedure:   Bilateral Ureteroscopy with laser lithotripsy and stents    Date of Surgery:  Clearance 07/25/23                                 Surgeon:  Dr. Marshia Ly Group or Practice Name:  [facility] Phone number:  [phone] Fax number:  [phone]   Type of Clearance Requested:   - Medical    Type of Anesthesia:  Not Indicated   Additional requests/questions:    [name]   07/17/2023, 1:56 PM

## 2023-07-17 NOTE — Telephone Encounter (Signed)
Dr. Wyline Mood,  You saw this patient on 07/07/2023. He has an echo scheduled for 07/20/2023. Per office protocol, will you please comment on medical clearance for bilateral ureteroscopy with laser lithotripsy and stents on 07/25/2023?  Please route your response to P CV DIV Preop. I will communicate with requesting office once you have given recommendations.   Thank you!  Carlos Levering, NP

## 2023-07-18 NOTE — Progress Notes (Addendum)
Anesthesia Chart Review   Case: [id number] Date/Time: 07/25/23 0715   Procedure: CYSTOSCOPY BILATERAL URETEROSCOPY/HOLMIUM LASER/STENT PLACEMENT (Bilateral) - 2 HRS FOR CASE   Anesthesia type: General   Pre-op diagnosis: BILATERAL RENAL STONE   Location: [facility] PROCEDURE [facility] / [facility]   Surgeons: Bjorn Pippin, MD       DISCUSSION:67 y.o. never smoker with h/o bladder caner, bilateral renal stone scheduled for above procedure 07/25/23 with Dr. Bjorn Pippin.    VS: BP 124/87   Pulse 61   Temp 36.9 C (Oral)   Resp 18   Ht 6\' 3"  (1.905 m)   Wt 132.9 kg   SpO2 98%   BMI 36.62 kg/m   PROVIDERS: Benita Stabile, MD is PCP    LABS: Labs reviewed: Acceptable for surgery. (all labs ordered are listed, but only abnormal results are displayed)  Labs Reviewed  COMPREHENSIVE METABOLIC PANEL - Abnormal; Notable for the following components:      Result Value   Calcium 8.7 (*)    Albumin 3.3 (*)    Alkaline Phosphatase 366 (*)    All other components within normal limits  PROTIME-INR     IMAGES:   EKG:   CV:  Past Medical History:  Diagnosis Date   Arthritis    Bladder cancer [facility])    age 16   Dysrhythmia    History of kidney stones    Multiple gastric ulcers    Pneumonia    when young    Past Surgical History:  Procedure Laterality Date   APPENDECTOMY     BIOPSY  11/23/2021   Procedure: BIOPSY;  Surgeon: Dolores Frame, MD;  Location: [facility];  Service: Gastroenterology;;   BIOPSY  09/23/2022   Procedure: BIOPSY;  Surgeon: Dolores Frame, MD;  Location: [facility];  Service: Gastroenterology;;   COLONOSCOPY WITH PROPOFOL N/A 11/23/2021   Procedure: COLONOSCOPY WITH PROPOFOL;  Surgeon: Dolores Frame, MD;  Location: [facility];  Service: Gastroenterology;  Laterality: N/A;  805 ASA 1   COLONOSCOPY WITH PROPOFOL N/A 09/23/2022   Procedure: COLONOSCOPY WITH PROPOFOL;  Surgeon: Dolores Frame, MD;  Location: [facility];  Service: Gastroenterology;  Laterality: N/A;  1:00pm, asa 2, pt knows to arrive at 10:00   GASTRECTOMY     HERNIA REPAIR     KIDNEY STONE SURGERY     PERCUTANEOUS NEPHROLITHOTRIPSY     POLYPECTOMY  11/23/2021   Procedure: POLYPECTOMY;  Surgeon: Dolores Frame, MD;  Location: [facility];  Service: Gastroenterology;;   Susa Day  09/23/2022   Procedure: Susa Day;  Surgeon: Marguerita Merles, Reuel Boom, MD;  Location: [facility];  Service: Gastroenterology;;    MEDICATIONS:  Cholecalciferol (VITAMIN D) 125 MCG (5000 UT) CAPS   Cyanocobalamin (VITAMIN B-12) 1000 MCG SUBL   folic acid (FOLVITE) 1 MG tablet   ketoconazole (NIZORAL) 2 % cream   tamsulosin (FLOMAX) 0.4 MG CAPS capsule   vedolizumab (ENTYVIO) 300 MG injection   No current facility-administered medications for this encounter.     Jodell Cipro Ward, PA-C [facility] Pre-Surgical Testing [phone]

## 2023-07-19 ENCOUNTER — Encounter: Payer: Medicare Other | Attending: Gastroenterology | Admitting: Internal Medicine

## 2023-07-19 VITALS — BP 113/82 | HR 56 | Temp 97.7°F | Resp 16

## 2023-07-19 DIAGNOSIS — K51019 Ulcerative (chronic) pancolitis with unspecified complications: Secondary | ICD-10-CM

## 2023-07-19 MED ORDER — VEDOLIZUMAB 300 MG IV SOLR
300.0000 mg | Freq: Once | INTRAVENOUS | Status: AC
  Administered 2023-07-19: 300 mg via INTRAVENOUS
  Filled 2023-07-19: qty 5

## 2023-07-19 NOTE — Progress Notes (Signed)
Diagnosis: Crohn's Disease  Provider:  Katrinka Blazing MD  Procedure: IV Infusion  IV Type: Peripheral, IV Location: L Hand  Entyvio (Vedolizumab), Dose: 300 mg  Infusion Start Time: 1008  Infusion Stop Time: 1038  Post Infusion IV Care: Observation period completed  Discharge: Condition: Good, Destination: Home . AVS Provided  Performed by:  Feliberto Harts, LPN

## 2023-07-20 ENCOUNTER — Ambulatory Visit: Payer: Medicare Other | Attending: Cardiology

## 2023-07-20 DIAGNOSIS — I480 Paroxysmal atrial fibrillation: Secondary | ICD-10-CM

## 2023-07-20 LAB — ECHOCARDIOGRAM COMPLETE
AR max vel: 2.93 cm2
AV Area VTI: 2.4 cm2
AV Area mean vel: 2.96 cm2
AV Mean grad: 4 mm[Hg]
AV Peak grad: 7.4 mm[Hg]
Ao pk vel: 1.36 m/s
Area-P 1/2: 2.96 cm2
Calc EF: 61 %
MV VTI: 2.76 cm2
S' Lateral: 3.5 cm
Single Plane A2C EF: 64.8 %
Single Plane A4C EF: 59.7 %

## 2023-07-21 DIAGNOSIS — K648 Other hemorrhoids: Secondary | ICD-10-CM | POA: Diagnosis not present

## 2023-07-21 DIAGNOSIS — K6289 Other specified diseases of anus and rectum: Secondary | ICD-10-CM | POA: Diagnosis not present

## 2023-07-21 DIAGNOSIS — Z79899 Other long term (current) drug therapy: Secondary | ICD-10-CM | POA: Diagnosis not present

## 2023-07-21 DIAGNOSIS — K523 Indeterminate colitis: Secondary | ICD-10-CM | POA: Diagnosis not present

## 2023-07-21 DIAGNOSIS — K635 Polyp of colon: Secondary | ICD-10-CM | POA: Diagnosis not present

## 2023-07-21 DIAGNOSIS — D124 Benign neoplasm of descending colon: Secondary | ICD-10-CM | POA: Diagnosis not present

## 2023-07-21 DIAGNOSIS — K5730 Diverticulosis of large intestine without perforation or abscess without bleeding: Secondary | ICD-10-CM | POA: Diagnosis not present

## 2023-07-21 DIAGNOSIS — K51918 Ulcerative colitis, unspecified with other complication: Secondary | ICD-10-CM | POA: Diagnosis not present

## 2023-07-21 DIAGNOSIS — K5140 Inflammatory polyps of colon without complications: Secondary | ICD-10-CM | POA: Diagnosis not present

## 2023-07-21 DIAGNOSIS — K6389 Other specified diseases of intestine: Secondary | ICD-10-CM | POA: Diagnosis not present

## 2023-07-21 DIAGNOSIS — G4730 Sleep apnea, unspecified: Secondary | ICD-10-CM | POA: Diagnosis not present

## 2023-07-21 DIAGNOSIS — N182 Chronic kidney disease, stage 2 (mild): Secondary | ICD-10-CM | POA: Diagnosis not present

## 2023-07-21 DIAGNOSIS — K8301 Primary sclerosing cholangitis: Secondary | ICD-10-CM | POA: Diagnosis not present

## 2023-07-21 DIAGNOSIS — Z800 Family history of malignant neoplasm of digestive organs: Secondary | ICD-10-CM | POA: Diagnosis not present

## 2023-07-21 DIAGNOSIS — K529 Noninfective gastroenteritis and colitis, unspecified: Secondary | ICD-10-CM | POA: Diagnosis not present

## 2023-07-21 DIAGNOSIS — K51019 Ulcerative (chronic) pancolitis with unspecified complications: Secondary | ICD-10-CM | POA: Diagnosis not present

## 2023-07-24 NOTE — H&P (Signed)
Subjective:  No diagnosis found.   Alex Thomas is a 67 yo male who was seen on 05/17/23 by Evette Georges for gross hematuria.  On CT he has a 9mm left renal pelvic stone and a 12mm LLP stone as well as a 9mm right renal pelvic stone and 12mm RMP stone.  The stones are radiopaque and visible on the scout films.  He has a history of bladder cancer with a prior Ta tumor near the right UO and he has BPH with BOO that is managed with tamsulosin.   He has had prior stones with PCNL in 9/15.  He is on TRT for hypogonadism.  His Cr was 1.4 on 06/01/23.     ROS:  ROS:  A complete review of systems was performed.  All systems are negative except for pertinent findings as noted.   ROS  No Known Allergies  @[employer] @  Past Medical History:  Diagnosis Date   Arthritis    Bladder cancer [facility])    age 55   Dysrhythmia    History of kidney stones    Multiple gastric ulcers    Pneumonia    when young    Past Surgical History:  Procedure Laterality Date   APPENDECTOMY     BIOPSY  11/23/2021   Procedure: BIOPSY;  Surgeon: Dolores Frame, MD;  [facility];  Service: Gastroenterology;;   BIOPSY  09/23/2022   Procedure: BIOPSY;  Surgeon: Dolores Frame, MD;  [facility];  Service: Gastroenterology;;   COLONOSCOPY WITH PROPOFOL N/A 11/23/2021   Procedure: COLONOSCOPY WITH PROPOFOL;  Surgeon: Dolores Frame, MD;  [facility];  Service: Gastroenterology;  Laterality: N/A;  805 ASA 1   COLONOSCOPY WITH PROPOFOL N/A 09/23/2022   Procedure: COLONOSCOPY WITH PROPOFOL;  Surgeon: Dolores Frame, MD;  [facility];  Service: Gastroenterology;  Laterality: N/A;  1:00pm, asa 2, pt knows to arrive at 10:00   GASTRECTOMY     HERNIA REPAIR     KIDNEY STONE SURGERY     PERCUTANEOUS NEPHROLITHOTRIPSY     POLYPECTOMY  11/23/2021   Procedure: POLYPECTOMY;  Surgeon: Dolores Frame, MD;  [facility];  Service:  Gastroenterology;;   Susa Day  09/23/2022   Procedure: Susa Day;  Surgeon: Marguerita Merles, Reuel Boom, MD;  [facility];  Service: Gastroenterology;;    Social History   Socioeconomic History   Marital status: Married    Spouse name: Not on file   Number of children: Not on file   Years of education: Not on file   Highest education level: Not on file  Occupational History   Not on file  Tobacco Use   Smoking status: Never    Passive exposure: Past   Smokeless tobacco: Never  Vaping Use   Vaping status: Never Used  Substance and Sexual Activity   Alcohol use: Not Currently    Comment: occ   Drug use: Not Currently   Sexual activity: Not Currently  Other Topics Concern   Not on file  Social History Narrative   Not on file   Social Determinants of Health   Financial Resource Strain: Not on file  Food Insecurity: Not on file  Transportation Needs: No Transportation Needs (03/31/2023)   Received from [facility] - Transportation    In the past 12 months, has lack of transportation kept you from medical appointments or from getting medications?: No    Lack of Transportation (  Non-Medical): No  Physical Activity: Not on file  Stress: Not on file  Social Connections: Not on file  Intimate Partner Violence: Not on file    Family History  Problem Relation Age of Onset   Colon cancer Mother        Objective: There were no vitals filed for this visit.   Physical Exam Vitals reviewed.  Constitutional:      Appearance: Normal appearance.  Cardiovascular:     Rate and Rhythm: Normal rate and regular rhythm.     Heart sounds: Normal heart sounds.  Pulmonary:     Effort: Pulmonary effort is normal.     Breath sounds: Normal breath sounds.  Neurological:     Mental Status: He is alert.     Lab Results:  PSA No results found for: "PSA" No results found for: "TESTOSTERONE"    Studies/Results: No results  found. No results found for this or any previous visit.  No results found for this or any previous visit.  No results found for this or any previous visit.  No results found for this or any previous visit.  No results found for this or any previous visit.  No valid procedures specified. No results found for this or any previous visit.  No results found for this or any previous visit.  ECHOCARDIOGRAM COMPLETE  Result Date: 07/20/2023    ECHOCARDIOGRAM REPORT   Patient Name:   ILYAAS KNACK Date of Exam: 07/20/2023 Medical Rec #:  [id number]    Height:       75.0 in Accession #:    [id number]   Weight:       293.0 lb Date of Birth:  1956-03-30    BSA:          2.581 m Patient Age:    67 years     BP:           114/82 mmHg Patient Gender: M            HR:           53 bpm. Exam Location:  [place] Procedure: 2D Echo, Cardiac Doppler and Color Doppler Indications:    I48.0 Paroxysmal atrial fibrillation  History:        Patient has no prior history of Echocardiogram examinations.                 Chronic kidney disease, Arrythmias:PVC and Atrial Fibrillation;                 Risk Factors:Sleep Apnea, Former Smoker, Morbid obesity and                 Dyslipidemia.  Sonographer:    Dominica Severin RCS, RVS Referring Phys: [id number] Dorothe Pea BRANCH  Sonographer Comments: Global longitudinal strain was attempted. IMPRESSIONS  1. Left ventricular ejection fraction, by estimation, is 60 to 65%. The left ventricle has normal function. The left ventricle has no regional wall motion abnormalities. Left ventricular diastolic parameters are consistent with Grade I diastolic dysfunction (impaired relaxation). Normal LVEDP.  2. Right ventricular systolic function is normal. The right ventricular size is normal. Tricuspid regurgitation signal is inadequate for assessing PA pressure.  3. The mitral valve is normal in structure. Trivial mitral valve regurgitation. No evidence of mitral stenosis.  4. The aortic valve is  tricuspid. Aortic valve regurgitation is trivial. No aortic stenosis is present. Comparison(s): No prior Echocardiogram. FINDINGS  Left Ventricle: Left ventricular ejection fraction, by estimation, is 60 to  65%. The left ventricle has normal function. The left ventricle has no regional wall motion abnormalities. The left ventricular internal cavity size was normal in size. There is  no left ventricular hypertrophy. Left ventricular diastolic parameters are consistent with Grade I diastolic dysfunction (impaired relaxation). Normal left ventricular filling pressure. Right Ventricle: The right ventricular size is normal. No increase in right ventricular wall thickness. Right ventricular systolic function is normal. Tricuspid regurgitation signal is inadequate for assessing PA pressure. Left Atrium: Left atrial size was normal in size. Right Atrium: Right atrial size was normal in size. Pericardium: There is no evidence of pericardial effusion. Mitral Valve: The mitral valve is normal in structure. Trivial mitral valve regurgitation. No evidence of mitral valve stenosis. MV peak gradient, 2.4 mmHg. The mean mitral valve gradient is 1.0 mmHg. Tricuspid Valve: The tricuspid valve is normal in structure. Tricuspid valve regurgitation is not demonstrated. No evidence of tricuspid stenosis. Aortic Valve: The aortic valve is tricuspid. Aortic valve regurgitation is trivial. No aortic stenosis is present. Aortic valve mean gradient measures 4.0 mmHg. Aortic valve peak gradient measures 7.4 mmHg. Aortic valve area, by VTI measures 2.40 cm. Pulmonic Valve: The pulmonic valve was not well visualized. Pulmonic valve regurgitation is not visualized. No evidence of pulmonic stenosis. Aorta: The aortic root and ascending aorta are structurally normal, with no evidence of dilitation. Venous: The inferior vena cava was not well visualized. IAS/Shunts: The interatrial septum was not well visualized.  LEFT VENTRICLE PLAX 2D LVIDd:          5.60 cm     Diastology LVIDs:         3.50 cm     LV e' medial:    6.96 cm/s LV PW:         1.20 cm     LV E/e' medial:  9.6 LV IVS:        1.20 cm     LV e' lateral:   8.92 cm/s LVOT diam:     2.20 cm     LV E/e' lateral: 7.5 LV SV:         80 LV SV Index:   31 LVOT Area:     3.80 cm  LV Volumes (MOD) LV vol d, MOD A2C: 91.3 ml LV vol d, MOD A4C: 82.9 ml LV vol s, MOD A2C: 32.1 ml LV vol s, MOD A4C: 33.4 ml LV SV MOD A2C:     59.2 ml LV SV MOD A4C:     82.9 ml LV SV MOD BP:      53.0 ml RIGHT VENTRICLE RV Basal diam:  3.00 cm RV Mid diam:    3.20 cm RV S prime:     10.00 cm/s TAPSE (M-mode): 2.5 cm LEFT ATRIUM             Index        RIGHT ATRIUM           Index LA diam:        4.70 cm 1.82 cm/m   RA Area:     15.10 cm LA Vol (A2C):   48.8 ml 18.91 ml/m  RA Volume:   31.60 ml  12.24 ml/m LA Vol (A4C):   36.6 ml 14.18 ml/m LA Biplane Vol: 42.2 ml 16.35 ml/m  AORTIC VALVE                    PULMONIC VALVE AV Area (Vmax):    2.93 cm  PV Vmax:       1.23 m/s AV Area (Vmean):   2.96 cm     PV Peak grad:  6.0 mmHg AV Area (VTI):     2.40 cm AV Vmax:           136.00 cm/s AV Vmean:          95.300 cm/s AV VTI:            0.332 m AV Peak Grad:      7.4 mmHg AV Mean Grad:      4.0 mmHg LVOT Vmax:         105.00 cm/s LVOT Vmean:        74.300 cm/s LVOT VTI:          0.210 m LVOT/AV VTI ratio: 0.63  AORTA Ao Root diam: 3.70 cm Ao Asc diam:  3.60 cm MITRAL VALVE MV Area (PHT): 2.96 cm    SHUNTS MV Area VTI:   2.76 cm    Systemic VTI:  0.21 m MV Peak grad:  2.4 mmHg    Systemic Diam: 2.20 cm MV Mean grad:  1.0 mmHg MV Vmax:       0.78 m/s MV Vmean:      46.0 cm/s MV Decel Time: 256 msec MV E velocity: 66.70 cm/s MV A velocity: 55.20 cm/s MV E/A ratio:  1.21 Vishnu Priya Mallipeddi Electronically signed by Winfield Rast Mallipeddi Signature Date/Time: 07/20/2023/3:42:28 PM    Final    CT ABDOMEN PELVIS W WO CONTRAST  Result Date: 06/14/2023 CLINICAL DATA:  Hematuria, history of kidney stones EXAM: CT ABDOMEN  AND PELVIS WITHOUT AND WITH CONTRAST TECHNIQUE: Multidetector CT imaging of the abdomen and pelvis was performed following the standard protocol before and following the bolus administration of intravenous contrast. RADIATION DOSE REDUCTION: This exam was performed according to the departmental dose-optimization program which includes automated exposure control, adjustment of the mA and/or kV according to patient size and/or use of iterative reconstruction technique. CONTRAST:  OMNIPAQUE IOHEXOL 300 MG/ML  SOLN COMPARISON:  MRI abdomen dated 03/25/2023. CT abdomen/pelvis dated 12/30/2021. FINDINGS: Lower chest: Lung bases are clear. Hepatobiliary: Prominent right hepatic lobe with atrophic left hepatic lobe, chronic. No focal hepatic lesion is seen. Gallbladder is unremarkable. No intrahepatic or extrahepatic ductal dilatation. Indwelling common duct stent. Pancreas: Within normal limits. Spleen: Within normal limits. Adrenals/Urinary Tract: Adrenal glands are within normal limits. Left upper pole renal sinus cysts, benign. No follow-up is recommended. 13 mm nonobstructing right lower pole renal calculus (series 5/image 62). Two calculi in the left renal collecting system measuring up to 8 mm (series 5/image 2). Two nonobstructing left lower pole renal calculi measuring up to 14 mm (series 5/image 43). Additional 10 mm calculus in the left renal collecting system (series 5/image 43). No distal ureteral calculi or hydronephrosis. Bladder is within normal limits. Stomach/Bowel: Stomach is within normal limits. No evidence of bowel obstruction. Appendix is not discretely visualized. Wall thickening extending from the ascending colon to the rectum, favored to be chronic, possibly related to ulcerative colitis. Vascular/Lymphatic: No evidence of abdominal aortic aneurysm. Atherosclerotic calcifications of the abdominal aorta and branch vessels, although vessels remain patent. No suspicious abdominopelvic  lymphadenopathy. Reproductive: Prostate is unremarkable. Other: No abdominopelvic ascites. Postsurgical changes related to prior upper abdominal ventral hernia repair. Additional tiny fat containing right paramidline periumbilical hernia (series 5/image 61). Musculoskeletal: Degenerative changes of the visualized thoracolumbar spine. IMPRESSION: Bilateral nonobstructing renal calculi, measuring up to 14 mm, as above. No  hydronephrosis. Chronic colonic wall thickening, possibly reflecting ulcerative colitis. Additional ancillary findings as above. Electronically Signed   By: Charline Bills M.D.   On: 06/14/2023 02:26     Assessment & Plan: Bilateral urolithiasis.   He has elected bilateral ureteroscopy with laser and stents.  He understands that this could be a staged procedure and is fully consented.    No orders of the defined types were placed in this encounter.    No orders of the defined types were placed in this encounter.     No follow-ups on file.   CC: Benita Stabile, MD      Bjorn Pippin 07/24/2023

## 2023-07-24 NOTE — Progress Notes (Signed)
Anesthesia Chart Review   Case: [id number] Date/Time: 07/25/23 0715   Procedure: CYSTOSCOPY BILATERAL URETEROSCOPY/HOLMIUM LASER/STENT PLACEMENT (Bilateral) - 2 HRS FOR CASE   Anesthesia type: General   Pre-op diagnosis: BILATERAL RENAL STONE   Location: [facility]   Surgeons: Bjorn Pippin, MD       DISCUSSION:67 y.o. never smoker with h/o PUD s/p partial gastric resection, PAF, bladder cancer, bilateral renal stones scheduled for above procedure 07/25/23 with Dr. Bjorn Pippin.   PAF new diagnosis during 8/24 admission.  At PCP follow up visit pt in SR. Pt last seen by cardiology 07/07/2023. Per OV note pt asymptomatic, CHADS2Vasc score 1, no anticoag started. Echo ordered.   Echo 07/20/2023 with EF 60-65%, no valvular problems.  VS: BP 124/87   Pulse 61   Temp 36.9 C (Oral)   Resp 18   Ht 6\' 3"  (1.905 m)   Wt 132.9 kg   SpO2 98%   BMI 36.62 kg/m   PROVIDERS: Benita Stabile, MD is PCP    LABS: Labs reviewed: Acceptable for surgery. (all labs ordered are listed, but only abnormal results are displayed)  Labs Reviewed  COMPREHENSIVE METABOLIC PANEL - Abnormal; Notable for the following components:      Result Value   Calcium 8.7 (*)    Albumin 3.3 (*)    Alkaline Phosphatase 366 (*)    All other components within normal limits  PROTIME-INR     IMAGES:   EKG:   CV: Echo 07/20/2023 1. Left ventricular ejection fraction, by estimation, is 60 to 65%. The  left ventricle has normal function. The left ventricle has no regional  wall motion abnormalities. Left ventricular diastolic parameters are  consistent with Grade I diastolic  dysfunction (impaired relaxation). Normal LVEDP.   2. Right ventricular systolic function is normal. The right ventricular  size is normal. Tricuspid regurgitation signal is inadequate for assessing  PA pressure.   3. The mitral valve is normal in structure. Trivial mitral valve  regurgitation. No evidence of mitral stenosis.    4. The aortic valve is tricuspid. Aortic valve regurgitation is trivial.  No aortic stenosis is present.   Past Medical History:  Diagnosis Date   Arthritis    Bladder cancer [facility])    age 16   Dysrhythmia    History of kidney stones    Multiple gastric ulcers    Pneumonia    when young    Past Surgical History:  Procedure Laterality Date   APPENDECTOMY     BIOPSY  11/23/2021   Procedure: BIOPSY;  Surgeon: Dolores Frame, MD;  [facility];  Service: Gastroenterology;;   BIOPSY  09/23/2022   Procedure: BIOPSY;  Surgeon: Dolores Frame, MD;  [facility];  Service: Gastroenterology;;   COLONOSCOPY WITH PROPOFOL N/A 11/23/2021   Procedure: COLONOSCOPY WITH PROPOFOL;  Surgeon: Dolores Frame, MD;  [facility];  Service: Gastroenterology;  Laterality: N/A;  805 ASA 1   COLONOSCOPY WITH PROPOFOL N/A 09/23/2022   Procedure: COLONOSCOPY WITH PROPOFOL;  Surgeon: Dolores Frame, MD;  [facility];  Service: Gastroenterology;  Laterality: N/A;  1:00pm, asa 2, pt knows to arrive at 10:00   GASTRECTOMY     HERNIA REPAIR     KIDNEY STONE SURGERY     PERCUTANEOUS NEPHROLITHOTRIPSY     POLYPECTOMY  11/23/2021   Procedure: POLYPECTOMY;  Surgeon: Dolores Frame, MD;  [facility];  Service: Gastroenterology;;  SCLEROTHERAPY  09/23/2022   Procedure: SCLEROTHERAPY;  Surgeon: Marguerita Merles, Reuel Boom, MD;  [facility];  Service: Gastroenterology;;    MEDICATIONS:  Cholecalciferol (VITAMIN D) 125 MCG (5000 UT) CAPS   Cyanocobalamin (VITAMIN B-12) 1000 MCG SUBL   folic acid (FOLVITE) 1 MG tablet   ketoconazole (NIZORAL) 2 % cream   tamsulosin (FLOMAX) 0.4 MG CAPS capsule   vedolizumab (ENTYVIO) 300 MG injection   No current facility-administered medications for this encounter.    Jodell Cipro Ward, PA-C [facility] [phone]

## 2023-07-24 NOTE — Anesthesia Preprocedure Evaluation (Addendum)
Anesthesia Evaluation  Patient identified by MRN, date of birth, ID band Patient awake    Reviewed: Allergy & Precautions, H&P , NPO status , Patient's Chart, lab work & pertinent test results, reviewed documented beta blocker date and time   Airway Mallampati: II  TM Distance: >3 FB Neck ROM: full    Dental no notable dental hx.    Pulmonary neg pulmonary ROS, sleep apnea , pneumonia   Pulmonary exam normal breath sounds clear to auscultation       Cardiovascular Exercise Tolerance: Good negative cardio ROS + dysrhythmias  Rhythm:regular Rate:Normal     Neuro/Psych  PSYCHIATRIC DISORDERS      negative neurological ROS  negative psych ROS   GI/Hepatic Neg liver ROS, PUD,,,  Endo/Other    Class 3 obesity  Renal/GU Renal diseasenegative Renal ROS  negative genitourinary   Musculoskeletal  (+) Arthritis ,    Abdominal   Peds  Hematology negative hematology ROS (+)   Anesthesia Other Findings   Reproductive/Obstetrics negative OB ROS                             Anesthesia Physical Anesthesia Plan  ASA: 3  Anesthesia Plan: General   Post-op Pain Management: Minimal or no pain anticipated, Celebrex PO (pre-op)* and Tylenol PO (pre-op)*   Induction: Intravenous  PONV Risk Score and Plan: 2 and Ondansetron, Dexamethasone and Treatment may vary due to age or medical condition  Airway Management Planned: Oral ETT and LMA  Additional Equipment:   Intra-op Plan:   Post-operative Plan: Extubation in OR  Informed Consent: I have reviewed the patients History and Physical, chart, labs and discussed the procedure including the risks, benefits and alternatives for the proposed anesthesia with the patient or authorized representative who has indicated his/her understanding and acceptance.       Plan Discussed with: CRNA and Anesthesiologist  Anesthesia Plan Comments: (See PAT note  07/12/2023  DISCUSSION:67 y.o. never smoker with h/o PUD s/p partial gastric resection, PAF, bladder cancer, bilateral renal stones scheduled for above procedure 07/25/23 with Dr. Bjorn Pippin.    PAF new diagnosis during 8/24 admission.  At PCP follow up visit pt in SR. Pt last seen by cardiology 07/07/2023. Per OV note pt asymptomatic, CHADS2Vasc score 1, no anticoag started. Echo ordered.    Echo 07/20/2023 with EF 60-65%, no valvular problems.  )        Anesthesia Quick Evaluation

## 2023-07-25 ENCOUNTER — Ambulatory Visit (HOSPITAL_COMMUNITY): Payer: Medicare Other | Admitting: Physician Assistant

## 2023-07-25 ENCOUNTER — Encounter (HOSPITAL_COMMUNITY)
Admission: RE | Disposition: A | Discharge status: Home / Self Care | Payer: Self-pay | Source: Home / Self Care | Attending: Urology

## 2023-07-25 ENCOUNTER — Ambulatory Visit (HOSPITAL_BASED_OUTPATIENT_CLINIC_OR_DEPARTMENT_OTHER): Payer: Medicare Other | Admitting: Anesthesiology

## 2023-07-25 ENCOUNTER — Telehealth: Payer: Self-pay

## 2023-07-25 ENCOUNTER — Encounter (HOSPITAL_COMMUNITY): Payer: Self-pay | Admitting: Urology

## 2023-07-25 ENCOUNTER — Other Ambulatory Visit: Payer: Self-pay

## 2023-07-25 ENCOUNTER — Other Ambulatory Visit (INDEPENDENT_AMBULATORY_CARE_PROVIDER_SITE_OTHER): Payer: Self-pay

## 2023-07-25 ENCOUNTER — Ambulatory Visit (HOSPITAL_COMMUNITY)
Admission: RE | Admit: 2023-07-25 | Discharge: 2023-07-25 | Disposition: A | Discharge status: Home / Self Care | Payer: Medicare Other | Attending: Urology | Admitting: Urology

## 2023-07-25 ENCOUNTER — Ambulatory Visit (HOSPITAL_COMMUNITY): Payer: Medicare Other

## 2023-07-25 DIAGNOSIS — N401 Enlarged prostate with lower urinary tract symptoms: Secondary | ICD-10-CM | POA: Insufficient documentation

## 2023-07-25 DIAGNOSIS — N138 Other obstructive and reflux uropathy: Secondary | ICD-10-CM | POA: Diagnosis not present

## 2023-07-25 DIAGNOSIS — G4730 Sleep apnea, unspecified: Secondary | ICD-10-CM | POA: Diagnosis not present

## 2023-07-25 DIAGNOSIS — Z8551 Personal history of malignant neoplasm of bladder: Secondary | ICD-10-CM | POA: Insufficient documentation

## 2023-07-25 DIAGNOSIS — Z8711 Personal history of peptic ulcer disease: Secondary | ICD-10-CM | POA: Insufficient documentation

## 2023-07-25 DIAGNOSIS — E66813 Obesity, class 3: Secondary | ICD-10-CM | POA: Diagnosis not present

## 2023-07-25 DIAGNOSIS — N200 Calculus of kidney: Secondary | ICD-10-CM

## 2023-07-25 DIAGNOSIS — M1990 Unspecified osteoarthritis, unspecified site: Secondary | ICD-10-CM | POA: Insufficient documentation

## 2023-07-25 DIAGNOSIS — Z79899 Other long term (current) drug therapy: Secondary | ICD-10-CM | POA: Insufficient documentation

## 2023-07-25 DIAGNOSIS — Z6836 Body mass index (BMI) 36.0-36.9, adult: Secondary | ICD-10-CM | POA: Insufficient documentation

## 2023-07-25 DIAGNOSIS — N189 Chronic kidney disease, unspecified: Secondary | ICD-10-CM | POA: Diagnosis not present

## 2023-07-25 DIAGNOSIS — E559 Vitamin D deficiency, unspecified: Secondary | ICD-10-CM

## 2023-07-25 DIAGNOSIS — I480 Paroxysmal atrial fibrillation: Secondary | ICD-10-CM | POA: Diagnosis not present

## 2023-07-25 DIAGNOSIS — E785 Hyperlipidemia, unspecified: Secondary | ICD-10-CM | POA: Diagnosis not present

## 2023-07-25 HISTORY — PX: CYSTOSCOPY/URETEROSCOPY/HOLMIUM LASER/STENT PLACEMENT: SHX6546

## 2023-07-25 SURGERY — CYSTOSCOPY/URETEROSCOPY/HOLMIUM LASER/STENT PLACEMENT
Anesthesia: General | Site: Ureter | Laterality: Bilateral

## 2023-07-25 MED ORDER — SODIUM CHLORIDE 0.9% FLUSH: 3.0000 mL | INTRAVENOUS | Status: DC | PRN

## 2023-07-25 MED ORDER — MORPHINE SULFATE (PF) 2 MG/ML IV SOLN: 2.0000 mg | INTRAVENOUS | Status: DC | PRN

## 2023-07-25 MED ORDER — ONDANSETRON HCL 4 MG/2ML IJ SOLN
INTRAMUSCULAR | Status: DC | PRN
  Administered 2023-07-25: 4 mg via INTRAVENOUS

## 2023-07-25 MED ORDER — FENTANYL CITRATE PF 50 MCG/ML IJ SOSY: 25.0000 ug | PREFILLED_SYRINGE | INTRAMUSCULAR | Status: DC | PRN

## 2023-07-25 MED ORDER — MIDAZOLAM HCL 5 MG/5ML IJ SOLN
INTRAMUSCULAR | Status: DC | PRN
  Administered 2023-07-25: 2 mg via INTRAVENOUS

## 2023-07-25 MED ORDER — EPHEDRINE SULFATE-NACL 50-0.9 MG/10ML-% IV SOSY
PREFILLED_SYRINGE | INTRAVENOUS | Status: DC | PRN
  Administered 2023-07-25: 5 mg via INTRAVENOUS

## 2023-07-25 MED ORDER — OXYCODONE HCL 5 MG/5ML PO SOLN: 5.0000 mg | Freq: Once | ORAL | Status: DC | PRN

## 2023-07-25 MED ORDER — PROPOFOL 10 MG/ML IV BOLUS
INTRAVENOUS | Status: AC
  Filled 2023-07-25: qty 20

## 2023-07-25 MED ORDER — CIPROFLOXACIN IN D5W 400 MG/200ML IV SOLN
INTRAVENOUS | Status: AC
  Filled 2023-07-25: qty 200

## 2023-07-25 MED ORDER — ACETAMINOPHEN 325 MG PO TABS: 650.0000 mg | ORAL_TABLET | ORAL | Status: DC | PRN

## 2023-07-25 MED ORDER — CIPROFLOXACIN HCL 500 MG PO TABS: 500.0000 mg | ORAL_TABLET | Freq: Two times a day (BID) | ORAL | 0 refills | Status: AC

## 2023-07-25 MED ORDER — SODIUM CHLORIDE 0.9 % IR SOLN
Status: DC | PRN
  Administered 2023-07-25 (×2): 3000 mL

## 2023-07-25 MED ORDER — IOHEXOL 300 MG/ML  SOLN
INTRAMUSCULAR | Status: DC | PRN
  Administered 2023-07-25: 18 mL

## 2023-07-25 MED ORDER — ACETAMINOPHEN 325 MG PO TABS: 325.0000 mg | ORAL_TABLET | ORAL | Status: DC | PRN

## 2023-07-25 MED ORDER — ORAL CARE MOUTH RINSE: 15.0000 mL | Freq: Once | OROMUCOSAL | Status: AC

## 2023-07-25 MED ORDER — ONDANSETRON HCL 4 MG/2ML IJ SOLN
INTRAMUSCULAR | Status: AC
  Filled 2023-07-25: qty 2

## 2023-07-25 MED ORDER — FENTANYL CITRATE (PF) 100 MCG/2ML IJ SOLN
INTRAMUSCULAR | Status: DC | PRN
  Administered 2023-07-25: 25 ug via INTRAVENOUS
  Administered 2023-07-25: 50 ug via INTRAVENOUS
  Administered 2023-07-25: 25 ug via INTRAVENOUS

## 2023-07-25 MED ORDER — OXYCODONE HCL 5 MG PO TABS: 5.0000 mg | ORAL_TABLET | Freq: Once | ORAL | Status: DC | PRN

## 2023-07-25 MED ORDER — EPHEDRINE 5 MG/ML INJ
INTRAVENOUS | Status: AC
  Filled 2023-07-25: qty 5

## 2023-07-25 MED ORDER — FENTANYL CITRATE (PF) 100 MCG/2ML IJ SOLN
INTRAMUSCULAR | Status: AC
  Filled 2023-07-25: qty 2

## 2023-07-25 MED ORDER — LIDOCAINE HCL (PF) 2 % IJ SOLN
INTRAMUSCULAR | Status: AC
  Filled 2023-07-25: qty 5

## 2023-07-25 MED ORDER — CEFAZOLIN SODIUM-DEXTROSE 2-4 GM/100ML-% IV SOLN
2.0000 g | INTRAVENOUS | Status: DC
  Filled 2023-07-25: qty 100

## 2023-07-25 MED ORDER — CELECOXIB 200 MG PO CAPS
200.0000 mg | ORAL_CAPSULE | Freq: Once | ORAL | Status: AC
  Administered 2023-07-25: 200 mg via ORAL
  Filled 2023-07-25: qty 1

## 2023-07-25 MED ORDER — ONDANSETRON HCL 4 MG/2ML IJ SOLN: 4.0000 mg | Freq: Once | INTRAMUSCULAR | Status: DC | PRN

## 2023-07-25 MED ORDER — PROPOFOL 10 MG/ML IV BOLUS
INTRAVENOUS | Status: DC | PRN
  Administered 2023-07-25: 250 mg via INTRAVENOUS

## 2023-07-25 MED ORDER — CEFAZOLIN IN SODIUM CHLORIDE 3-0.9 GM/100ML-% IV SOLN
INTRAVENOUS | Status: AC
  Filled 2023-07-25: qty 100

## 2023-07-25 MED ORDER — MEPERIDINE HCL 50 MG/ML IJ SOLN: 6.2500 mg | INTRAMUSCULAR | Status: DC | PRN

## 2023-07-25 MED ORDER — CHLORHEXIDINE GLUCONATE 0.12 % MT SOLN
15.0000 mL | Freq: Once | OROMUCOSAL | Status: AC
Start: 2023-07-25 — End: 2023-07-25
  Administered 2023-07-25: 15 mL via OROMUCOSAL

## 2023-07-25 MED ORDER — SODIUM CHLORIDE 0.9 % IV SOLN: 250.0000 mL | INTRAVENOUS | Status: DC | PRN

## 2023-07-25 MED ORDER — DEXAMETHASONE SODIUM PHOSPHATE 10 MG/ML IJ SOLN
INTRAMUSCULAR | Status: DC | PRN
  Administered 2023-07-25: 8 mg via INTRAVENOUS

## 2023-07-25 MED ORDER — ACETAMINOPHEN 650 MG RE SUPP: 650.0000 mg | RECTAL | Status: DC | PRN

## 2023-07-25 MED ORDER — ACETAMINOPHEN 500 MG PO TABS
1000.0000 mg | ORAL_TABLET | Freq: Once | ORAL | Status: AC
Start: 2023-07-25 — End: 2023-07-25
  Administered 2023-07-25: 1000 mg via ORAL
  Filled 2023-07-25: qty 2

## 2023-07-25 MED ORDER — MIDAZOLAM HCL 2 MG/2ML IJ SOLN
INTRAMUSCULAR | Status: AC
  Filled 2023-07-25: qty 2

## 2023-07-25 MED ORDER — DEXTROSE 5 % IV SOLN
INTRAVENOUS | Status: DC | PRN
  Administered 2023-07-25: 3 g via INTRAVENOUS

## 2023-07-25 MED ORDER — ACETAMINOPHEN 160 MG/5ML PO SOLN: 325.0000 mg | ORAL | Status: DC | PRN

## 2023-07-25 MED ORDER — OXYCODONE HCL 5 MG PO TABS: 5.0000 mg | ORAL_TABLET | ORAL | Status: DC | PRN

## 2023-07-25 MED ORDER — DEXAMETHASONE SODIUM PHOSPHATE 10 MG/ML IJ SOLN
INTRAMUSCULAR | Status: AC
  Filled 2023-07-25: qty 1

## 2023-07-25 MED ORDER — LACTATED RINGERS IV SOLN: INTRAVENOUS | Status: DC

## 2023-07-25 MED ORDER — CIPROFLOXACIN IN D5W 400 MG/200ML IV SOLN
INTRAVENOUS | Status: DC | PRN
  Administered 2023-07-25: 400 mg via INTRAVENOUS

## 2023-07-25 MED ORDER — SODIUM CHLORIDE 0.9% FLUSH: 3.0000 mL | Freq: Two times a day (BID) | INTRAVENOUS | Status: DC

## 2023-07-25 MED ORDER — LIDOCAINE 2% (20 MG/ML) 5 ML SYRINGE
INTRAMUSCULAR | Status: DC | PRN
  Administered 2023-07-25: 100 mg via INTRAVENOUS

## 2023-07-25 MED ORDER — OXYCODONE-ACETAMINOPHEN 5-325 MG PO TABS
1.0000 | ORAL_TABLET | Freq: Four times a day (QID) | ORAL | 0 refills | Status: DC | PRN
Start: 2023-07-25 — End: 2023-10-09

## 2023-07-25 MED ORDER — 0.9 % SODIUM CHLORIDE (POUR BTL) OPTIME
TOPICAL | Status: DC | PRN
  Administered 2023-07-25: 1000 mL

## 2023-07-25 SURGICAL SUPPLY — 24 items
BAG URO CATCHER STRL LF (MISCELLANEOUS) ×1 IMPLANT
BASKET STONE NCOMPASS (UROLOGICAL SUPPLIES) IMPLANT
CATH URETERAL DUAL LUMEN 10F (MISCELLANEOUS) IMPLANT
CATH URETL OPEN 5X70 (CATHETERS) IMPLANT
CLOTH BEACON ORANGE TIMEOUT ST (SAFETY) ×1 IMPLANT
EXTRACTOR STONE NITINOL NGAGE (UROLOGICAL SUPPLIES) IMPLANT
GLOVE SURG SS PI 8.0 STRL IVOR (GLOVE) ×1 IMPLANT
GOWN SPEC L4 XLG W/TWL (GOWN DISPOSABLE) ×1 IMPLANT
GUIDEWIRE STR DUAL SENSOR (WIRE) ×1 IMPLANT
IV NS IRRIG 3000ML ARTHROMATIC (IV SOLUTION) ×1 IMPLANT
KIT TURNOVER KIT A (KITS) IMPLANT
LASER FIB FLEXIVA PULSE ID 365 (Laser) IMPLANT
LASER FIB FLEXIVA PULSE ID 550 (Laser) IMPLANT
LASER FIB FLEXIVA PULSE ID 910 (Laser) IMPLANT
MANIFOLD NEPTUNE II (INSTRUMENTS) ×1 IMPLANT
PACK CYSTO (CUSTOM PROCEDURE TRAY) ×1 IMPLANT
PAD PREP 24X48 CUFFED NSTRL (MISCELLANEOUS) ×1 IMPLANT
SHEATH NAVIGATOR HD 11/13X36 (SHEATH) IMPLANT
SHEATH NAVIGATOR HD 12/14X36 (SHEATH) IMPLANT
STENT URET 6FRX26 CONTOUR (STENTS) IMPLANT
TRACTIP FLEXIVA PULS ID 200XHI (Laser) IMPLANT
TRACTIP FLEXIVA PULSE ID 200 (Laser) ×2
TUBING CONNECTING 10 (TUBING) ×1 IMPLANT
TUBING UROLOGY SET (TUBING) ×1 IMPLANT

## 2023-07-25 NOTE — Telephone Encounter (Signed)
Patient aware to have imaging early next week.  Orders in.

## 2023-07-25 NOTE — Telephone Encounter (Signed)
-----   Message from Bjorn Pippin sent at 07/25/2023  9:40 AM EST ----- I need for Mr. Alex Thomas to have a KUB early next week to assess his residual stone burden.

## 2023-07-25 NOTE — Discharge Instructions (Addendum)
Bring stones to the office.

## 2023-07-25 NOTE — Telephone Encounter (Signed)
Ok to proceed with procedure from cardiac standpoint  Dominga Ferry MD

## 2023-07-25 NOTE — Transfer of Care (Signed)
Immediate Anesthesia Transfer of Care Note  Patient: Alex Thomas  Procedure(s) Performed: CYSTOSCOPY BILATERAL URETEROSCOPY/RETROGRADE PYELOGRAM/HOLMIUM LASER/STENT PLACEMENT (Bilateral: Ureter)  Patient Location: PACU  Anesthesia Type:General  Level of Consciousness: awake, alert , oriented, and patient cooperative  Airway & Oxygen Therapy: Patient Spontanous Breathing and Patient connected to face mask oxygen  Post-op Assessment: Report given to RN, Post -op Vital signs reviewed and stable, and Patient moving all extremities  Post vital signs: Reviewed and stable  Last Vitals:  Vitals Value Taken Time  BP 141/79 07/25/23 0937  Temp    Pulse 77 07/25/23 0940  Resp 14 07/25/23 0940  SpO2 98 % 07/25/23 0940  Vitals shown include unfiled device data.  Last Pain:  Vitals:   07/25/23 0553  TempSrc: Oral  PainSc: 0-No pain      Patients Stated Pain Goal: 4 (07/25/23 0553)  Complications: No notable events documented.

## 2023-07-25 NOTE — Anesthesia Procedure Notes (Signed)
Procedure Name: LMA Insertion Date/Time: 07/25/2023 7:38 AM  Performed by: Elisabeth Cara, CRNAPre-anesthesia Checklist: Patient identified, Emergency Drugs available, Suction available, Patient being monitored and Timeout performed Patient Re-evaluated:Patient Re-evaluated prior to induction Oxygen Delivery Method: Circle system utilized Preoxygenation: Pre-oxygenation with 100% oxygen Induction Type: IV induction LMA: LMA with gastric port inserted LMA Size: 5.0 Number of attempts: 1 Placement Confirmation: positive ETCO2 and breath sounds checked- equal and bilateral Tube secured with: Tape Dental Injury: Teeth and Oropharynx as per pre-operative assessment

## 2023-07-25 NOTE — Interval H&P Note (Signed)
History and Physical Interval Note:  he has been having occasional pain but no severe issues or bleeding.   07/25/2023 7:17 AM  Lynford Humphrey  has presented today for surgery, with the diagnosis of BILATERAL RENAL STONE.  The various methods of treatment have been discussed with the patient and family. After consideration of risks, benefits and other options for treatment, the patient has consented to  Procedure(s) with comments: CYSTOSCOPY BILATERAL URETEROSCOPY/HOLMIUM LASER/STENT PLACEMENT (Bilateral) - 2 HRS FOR CASE as a surgical intervention.  The patient's history has been reviewed, patient examined, no change in status, stable for surgery.  I have reviewed the patient's chart and labs.  Questions were answered to the patient's satisfaction.     Bjorn Pippin

## 2023-07-25 NOTE — Op Note (Signed)
Procedure: 1.  Cystoscopy with bilateral retrograde pyelograms and interpretation. 2.  First stage bilateral ureteroscopy with holmium laser lithotripsy and stone extraction with insertion of double-J stents. 3.  Application of fluoroscopy.  Preop diagnosis: Bilateral renal pelvic and calyceal stones.  Postop diagnosis: Same.  Surgeon: Dr. Bjorn Pippin.  Anesthesia: General.  Specimen: Stone fragments.  Drains: Bilateral 6 French by 26 cm contour double-J stent.  EBL: None.  Complications: None.  Indications: The patient is a 67 year old male with recurrent urolithiasis who was recently evaluated for gross hematuria and was found to have bilateral renal pelvic stones and calyceal stones.  After reviewing the options he is elected undergo bilateral ureteroscopy.  Procedure: He was taken the operating room was given Ancef and Cipro.  A general anesthetic was induced.  He was placed in lithotomy position and fitted with PAS hose.  His perineum and genitalia were prepped with Betadine solution he was draped in usual sterile fashion.  Cystoscopy was performed using the 21 [place] scope and 30 degree lens.  Examination really normal urethra.  The external sphincter was intact.  The prostatic urethra short with some lateral lobe hyperplasia without significant obstruction.  Examination of bladder revealed some blood in the dependent portion of the bladder.  The bladder mucosa was unremarkable.  There was mild trabeculation.  The ureteral orifices were unremarkable.  The right ureteral orifice was cannulated with a 5 [place] open-ended catheter and Omnipaque was instilled.  The right retrograde pyelogram demonstrated normal caliber ureter to the UPJ where there was a filling defect consistent with the known renal pelvic stone.  There was no significant dilation.  Additional filling defects were noted consistent with the calyceal stones.  The stones were readily seen on the precontrast view.  A left  retrograde pyelogram was then performed with a 5 [place] open-ended catheter and Omnipaque.  The left retrograde pyelogram similarly demonstrated a normal caliber ureter at the UPJ where there was a filling defect consistent with the known renal stone and additional filling defects consistent with the known calyceal stones there was some blunting of the calyces.  A sensor wire was then advanced to the right kidney and the cystoscope was then removed and replaced alongside the wire and a wire was placed to the left kidney.  The left wire was secured.  The inner core of an 11/13 [place] 46 cm digital access sheath was advanced to the right kidney under fluoroscopic guidance without difficulty.  This was followed by the assembled sheath.  The inner core and wire were then removed.  The dual-lumen digital flexible scope was then inserted to the right kidney through the access sheath and the renal pelvic stone was identified but it flushed into the upper pole where it was engaged with a 242 m laser fiber with the Moses laser set on the dusting setting with 0.3 J on the left pedal at 63 Hz and 0.8 J on the right pedal at 10 Hz.  The bulk of the fragmentation was performed using the low power setting.  The stone was broken into fine fragments and dust.  I then intact the stones in the mid and lower calyces once again reducing the use to fine fragments and dust.  An engage basket was used to remove some of the larger fragments but all of them were 3 mm and less in size and the remaining fragments were too small and numerous to remove the bulk of the material.  At this point a sensor  wire was replaced to the kidney through the ureteroscope which was then removed along with the sheath.  The wire was then secured.  The 18 French inner core of another 46 cm digital access sheath was then easily passed to the left kidney and this was followed by the assembled sheath.  The inner core and wire were then removed.  The  dual-lumen digital flexible scope was then once again passed and the renal pelvic stone was reduced to fine fragments and sand.  A larger lower pole calyceal stone was then similar treated along with a smaller lower pole calyceal stone.  Once again once the stone had been fully fragmented some of the larger fragments were removed and were found to be 3 mm her last.  The remaining fragments were too small and numerous to remove the bulk of the material.  A sensor wire was then replaced to the kidney through the ureteroscope and the ureteroscope and sheath were removed.  The cystoscope was then reinserted over the left wire and a 6 [place] by 26 cm contour double-J stent without tether was placed under fluoroscopic guidance to the kidney.  The wire was removed, leaving good coil in the kidney and a good coil in the bladder.  The cystoscope was then reinserted over the right wire and the stent was placed in a similar fashion but I was not comfortable with the proximal coil so I remove the stent and placed the open-ended catheter back into the proximal ureter and injected contrast which confirmed no extravasation.  The wire was then replaced into an upper poe calyx and then the 6 [place] by 26 cm contour double-J stent was passed under fluoroscopic guidance.  The wire was removed, a good coil in the kidney and a good coil in the bladder.  The bladder was drained and the cystoscope was removed.  He was taken down from lithotomy position, his anesthetic was reversed and he was moved recovery in stable condition.  There were no complications.  The patient will be given the stone fragments bring the office for analysis.Marland Kitchen

## 2023-07-25 NOTE — Telephone Encounter (Signed)
   Patient Name: Alex Thomas  DOB: [birth year] MRN: [id number]  Primary Cardiologist: None  Chart reviewed as part of pre-operative protocol coverage. Given past medical history and time since last visit, based on ACC/AHA guidelines, Alex Thomas is at acceptable risk for the planned procedure without further cardiovascular testing.   Per Dr.Branch "Ok to proceed with procedure from cardiac standpoint"   The patient was advised that if he develops new symptoms prior to surgery to contact our office to arrange for a follow-up visit, and he verbalized understanding.  I will route this recommendation to the requesting party via Epic fax function and remove from pre-op pool.  Please call with questions.  Joni Reining, NP 07/25/2023, 12:25 PM

## 2023-07-25 NOTE — Anesthesia Postprocedure Evaluation (Signed)
Anesthesia Post Note  Patient: Alex Thomas  Procedure(s) Performed: CYSTOSCOPY BILATERAL URETEROSCOPY/RETROGRADE PYELOGRAM/HOLMIUM LASER/STENT PLACEMENT (Bilateral: Ureter)     Patient location during evaluation: PACU Anesthesia Type: General Level of consciousness: awake and alert Pain management: pain level controlled Vital Signs Assessment: post-procedure vital signs reviewed and stable Respiratory status: spontaneous breathing, nonlabored ventilation, respiratory function stable and patient connected to nasal cannula oxygen Cardiovascular status: blood pressure returned to baseline and stable Postop Assessment: no apparent nausea or vomiting Anesthetic complications: no   No notable events documented.  Last Vitals:  Vitals:   07/25/23 0937 07/25/23 0945  BP: (!) 141/79 128/83  Pulse: 74 71  Resp: 12 12  Temp: 37 C   SpO2: 98% 92%    Last Pain:  Vitals:   07/25/23 0945  TempSrc:   PainSc: 1                  Christion Leonhard

## 2023-07-26 ENCOUNTER — Encounter (HOSPITAL_COMMUNITY): Payer: Self-pay | Admitting: Urology

## 2023-08-01 ENCOUNTER — Telehealth (INDEPENDENT_AMBULATORY_CARE_PROVIDER_SITE_OTHER): Payer: Self-pay

## 2023-08-01 NOTE — Telephone Encounter (Signed)
error 

## 2023-08-01 NOTE — Progress Notes (Deleted)
Name: Alex Thomas DOB: 1956-03-14 MRN: [id number]  Diagnoses: 1) Post-operative state  HPI: SEVON GUZZARDI presents post-operatively. - GU history: 1. Bladder cancer. History of TA bladder cancer near his right UO.  2. BPH with BOO & LUTS. Taking Flomax 0.4 mg daily. 3. Kidney stones (recurrent). - 05/30/2014: Underwent left PCNL for a 2 cm stone. Stone composition was 80% calcium phosphate and 20% calcium oxalate. - Previously took potassium citrate and Beelith for stone prevention. - Currently taking Chanca Piedra supplement (Phyllanthus niruri) for stone prevention. - 03/25/2023: Abdomen MRI showed "Numerous nonobstructive calculi in the collecting systems of both kidneys." 4. Hypogonadism. - Has done well on testosterone cypionate injections.  He is s/p the following procedure on 07/25/2023 by Dr. Annabell Howells for management of bilateral kidney stones with hematuria.  Procedure:  1.  Cystoscopy with bilateral retrograde pyelograms and interpretation. 2.  First stage bilateral ureteroscopy with holmium laser lithotripsy and stone extraction with insertion of double-J stents. 3.  Application of fluoroscopy.   Preop diagnosis: Bilateral renal pelvic and calyceal stones.   Postop diagnosis: Same.   Drains: Bilateral 6 French by 26 cm contour double-J stent.  ***after reviewing KUB, consult with Dr. Annabell Howells re: 2nd step procedure and/or bilateral ureteral stent removal   Postop course: KUB today: Awaiting radiology read; ***  Today He reports ***  He {Actions; denies-reports:120008} increased urinary urgency, frequency, nocturia, dysuria, gross hematuria, hesitancy, straining to void, or sensations of incomplete emptying.  He {Actions; denies-reports:120008} flank pain or abdominal pain. He {Actions; denies-reports:120008} fevers, nausea, or vomiting.   Fall Screening: Do you usually have a device to assist in your mobility? {yes/no:20286} ***cane / ***walker /  ***wheelchair   Medications: Current Outpatient Medications  Medication Sig Dispense Refill   Cholecalciferol (VITAMIN D) 125 MCG (5000 UT) CAPS Take 10,000 Units by mouth daily.     ciprofloxacin (CIPRO) 500 MG tablet Take 1 tablet (500 mg total) by mouth 2 (two) times daily for 10 days. 6 tablet 0   Cyanocobalamin (VITAMIN B-12) 1000 MCG SUBL Place 2 tablets (2,000 mcg total) under the tongue daily. 180 tablet 1   folic acid (FOLVITE) 1 MG tablet TAKE ONE (1) TABLET BY MOUTH EVERY DAY 90 tablet 1   ketoconazole (NIZORAL) 2 % cream Apply 1 Application topically daily as needed for irritation.     oxyCODONE-acetaminophen (PERCOCET) 5-325 MG tablet Take 1 tablet by mouth every 6 (six) hours as needed for severe pain (pain score 7-10). 20 tablet 0   tamsulosin (FLOMAX) 0.4 MG CAPS capsule Take 0.4 mg by mouth daily.     vedolizumab (ENTYVIO) 300 MG injection Inject 300 mg into the vein every 28 (twenty-eight) days.     No current facility-administered medications for this visit.    Allergies: No Known Allergies  Past Medical History:  Diagnosis Date   Arthritis    Bladder cancer [facility])    age 67   Dysrhythmia    History of kidney stones    Multiple gastric ulcers    Pneumonia    when young   Past Surgical History:  Procedure Laterality Date   APPENDECTOMY     BIOPSY  11/23/2021   Procedure: BIOPSY;  Surgeon: Dolores Frame, MD;  [facility]: [facility];  Service: Gastroenterology;;   BIOPSY  09/23/2022   Procedure: BIOPSY;  Surgeon: Dolores Frame, MD;  [facility]: [facility];  Service: Gastroenterology;;   COLONOSCOPY WITH PROPOFOL N/A 11/23/2021   Procedure: COLONOSCOPY WITH PROPOFOL;  Surgeon: Marguerita Merles, Reuel Boom, MD;  Location: [facility];  Service: Gastroenterology;  Laterality: N/A;  805 ASA 1   COLONOSCOPY WITH PROPOFOL N/A 09/23/2022   Procedure: COLONOSCOPY WITH PROPOFOL;  Surgeon: Dolores Frame, MD;  [facility]: [facility];   Service: Gastroenterology;  Laterality: N/A;  1:00pm, asa 2, pt knows to arrive at 10:00   CYSTOSCOPY/URETEROSCOPY/HOLMIUM LASER/STENT PLACEMENT Bilateral 07/25/2023   Procedure: CYSTOSCOPY BILATERAL URETEROSCOPY/RETROGRADE PYELOGRAM/HOLMIUM LASER/STENT PLACEMENT;  Surgeon: Bjorn Pippin, MD;  Location: [facility];  Service: Urology;  Laterality: Bilateral;  2 HRS FOR CASE   GASTRECTOMY     HERNIA REPAIR     KIDNEY STONE SURGERY     PERCUTANEOUS NEPHROLITHOTRIPSY     POLYPECTOMY  11/23/2021   Procedure: POLYPECTOMY;  Surgeon: Marguerita Merles, Reuel Boom, MD;  [facility]: [facility];  Service: Gastroenterology;;   Susa Day  09/23/2022   Procedure: Susa Day;  Surgeon: Marguerita Merles, Reuel Boom, MD;  Location: [facility];  Service: Gastroenterology;;   Family History  Problem Relation Age of Onset   Colon cancer Mother    Social History   Socioeconomic History   Marital status: Married    Spouse name: Not on file   Number of children: Not on file   Years of education: Not on file   Highest education level: Not on file  Occupational History   Not on file  Tobacco Use   Smoking status: Never    Passive exposure: Past   Smokeless tobacco: Never  Vaping Use   Vaping status: Never Used  Substance and Sexual Activity   Alcohol use: Not Currently    Comment: occ   Drug use: Not Currently   Sexual activity: Not Currently  Other Topics Concern   Not on file  Social History Narrative   Not on file   Social Determinants of Health   Financial Resource Strain: Not on file  Food Insecurity: Not on file  Transportation Needs: No Transportation Needs (03/31/2023)   Received [facility]   PRAPARE - Transportation    In the past 12 months, has lack of transportation kept you from medical appointments or from getting medications?: No    Lack of Transportation (Non-Medical): No  Physical Activity: Not on file  Stress: Not on file  Social Connections: Not on  file  Intimate Partner Violence: Not on file    SUBJECTIVE  Review of Systems*** Constitutional: Patient denies any unintentional weight loss or change in strength lntegumentary: Patient denies any rashes or pruritus Cardiovascular: Patient denies chest pain or syncope Respiratory: Patient denies shortness of breath Gastrointestinal: Patient ***denies nausea, vomiting, constipation, or diarrhea Musculoskeletal: Patient denies muscle cramps or weakness Neurologic: Patient denies convulsions or seizures Allergic/Immunologic: Patient denies recent allergic reaction(s) Hematologic/Lymphatic: Patient denies bleeding tendencies Endocrine: Patient denies heat/cold intolerance  GU: As per HPI.  OBJECTIVE There were no vitals filed for this visit. There is no height or weight on file to calculate BMI.  Physical Examination*** Constitutional: No obvious distress; patient is non-toxic appearing  Cardiovascular: No visible lower extremity edema.  Respiratory: The patient does not have audible wheezing/stridor; respirations do not appear labored  Gastrointestinal: Abdomen non-distended Musculoskeletal: Normal ROM of UEs  Skin: No obvious rashes/open sores  Neurologic: CN 2-12 grossly intact Psychiatric: Answered questions appropriately with normal affect  Hematologic/Lymphatic/Immunologic: No obvious bruises or sites of spontaneous bleeding  UA: ***negative *** WBC/hpf, *** RBC/hpf, bacteria (***) PVR: *** ml  ASSESSMENT No diagnosis found.  We reviewed the operative procedures  and findings.  He is doing ***well. Pre-operative symptoms are *** since the procedure. We reviewed recent imaging results; ***awaiting radiology results, appears to have ***no acute findings.  ***For stone prevention: Advised adequate hydration and we discussed option to consider low oxalate diet given that calcium oxalate is the most common type of stone. Handout provided about stone prevention  diet.  ***For recurrent stone formers: We discussed option to proceed with 24 hour urinalysis ([facility]) for metabolic evaluation, which may help with targeted recommendations for dietary I medication therapies for stone prevention. Patient elected to ***proceed/ ***hold off.  Will plan to follow up in ***6 months / ***1 year with KUB ***RUS for stone surveillance or sooner if needed.  Pt verbalized understanding and agreement. All questions were answered.  PLAN Advised the following: ***Maintain adequate fluid intake. ***Low oxalate diet. No follow-ups on file.  *** Billing: No global period for ureteroscopic stone manipulation; charge by regular E&M***  No orders of the defined types were placed in this encounter.   It has been explained that the patient is to follow regularly with their PCP in addition to all other providers involved in their care and to follow instructions provided by these respective offices. Patient advised to contact urology clinic if any urologic-pertaining questions, concerns, new symptoms or problems arise in the interim period.  There are no Patient Instructions on file for this visit.  Electronically signed by:  Donnita Falls, MSN, FNP-C, CUNP 08/01/2023 11:52 AM

## 2023-08-02 DIAGNOSIS — I4891 Unspecified atrial fibrillation: Secondary | ICD-10-CM | POA: Diagnosis not present

## 2023-08-03 ENCOUNTER — Ambulatory Visit (HOSPITAL_COMMUNITY)
Admission: RE | Admit: 2023-08-03 | Discharge: 2023-08-03 | Disposition: A | Discharge status: Home / Self Care | Payer: Medicare Other | Source: Ambulatory Visit | Attending: Urology | Admitting: Urology

## 2023-08-03 DIAGNOSIS — N200 Calculus of kidney: Secondary | ICD-10-CM | POA: Diagnosis not present

## 2023-08-08 ENCOUNTER — Encounter: Payer: Medicare Other | Admitting: Urology

## 2023-08-08 DIAGNOSIS — N200 Calculus of kidney: Secondary | ICD-10-CM

## 2023-08-08 DIAGNOSIS — Z09 Encounter for follow-up examination after completed treatment for conditions other than malignant neoplasm: Secondary | ICD-10-CM

## 2023-08-14 DIAGNOSIS — K8031 Calculus of bile duct with cholangitis, unspecified, with obstruction: Secondary | ICD-10-CM | POA: Diagnosis not present

## 2023-08-14 DIAGNOSIS — I480 Paroxysmal atrial fibrillation: Secondary | ICD-10-CM | POA: Diagnosis not present

## 2023-08-14 DIAGNOSIS — K8050 Calculus of bile duct without cholangitis or cholecystitis without obstruction: Secondary | ICD-10-CM | POA: Diagnosis not present

## 2023-08-14 DIAGNOSIS — K838 Other specified diseases of biliary tract: Secondary | ICD-10-CM | POA: Diagnosis not present

## 2023-08-14 DIAGNOSIS — Z4659 Encounter for fitting and adjustment of other gastrointestinal appliance and device: Secondary | ICD-10-CM | POA: Diagnosis not present

## 2023-08-14 DIAGNOSIS — Z8719 Personal history of other diseases of the digestive system: Secondary | ICD-10-CM | POA: Diagnosis not present

## 2023-08-14 DIAGNOSIS — Z9689 Presence of other specified functional implants: Secondary | ICD-10-CM | POA: Diagnosis not present

## 2023-08-14 DIAGNOSIS — R932 Abnormal findings on diagnostic imaging of liver and biliary tract: Secondary | ICD-10-CM | POA: Diagnosis not present

## 2023-08-14 DIAGNOSIS — N182 Chronic kidney disease, stage 2 (mild): Secondary | ICD-10-CM | POA: Diagnosis not present

## 2023-08-16 ENCOUNTER — Encounter: Payer: Medicare Other | Attending: Gastroenterology | Admitting: *Deleted

## 2023-08-16 VITALS — BP 128/76 | HR 70 | Temp 97.8°F | Resp 18

## 2023-08-16 DIAGNOSIS — K51019 Ulcerative (chronic) pancolitis with unspecified complications: Secondary | ICD-10-CM | POA: Insufficient documentation

## 2023-08-16 DIAGNOSIS — K8301 Primary sclerosing cholangitis: Secondary | ICD-10-CM | POA: Diagnosis not present

## 2023-08-16 MED ORDER — VEDOLIZUMAB 300 MG IV SOLR
300.0000 mg | Freq: Once | INTRAVENOUS | Status: AC
  Administered 2023-08-16: 300 mg via INTRAVENOUS
  Filled 2023-08-16: qty 5

## 2023-08-16 NOTE — Progress Notes (Signed)
Diagnosis: Ulcerative Colitis  Provider:  Katrinka Blazing MD  Procedure: IV Infusion  IV Type: Peripheral, IV Location: L Hand  Entyvio (Vedolizumab), Dose: 300 mg  Infusion Start Time: 1005  Infusion Stop Time: 1035  Post Infusion IV Care: Observation period completed  Discharge: Condition: Good, Destination: Home . AVS Provided  Performed by:  Daleen Squibb, RN

## 2023-08-18 DIAGNOSIS — K429 Umbilical hernia without obstruction or gangrene: Secondary | ICD-10-CM | POA: Diagnosis not present

## 2023-08-18 DIAGNOSIS — N182 Chronic kidney disease, stage 2 (mild): Secondary | ICD-10-CM | POA: Diagnosis not present

## 2023-08-18 DIAGNOSIS — I480 Paroxysmal atrial fibrillation: Secondary | ICD-10-CM | POA: Diagnosis not present

## 2023-08-18 DIAGNOSIS — Z9049 Acquired absence of other specified parts of digestive tract: Secondary | ICD-10-CM | POA: Diagnosis not present

## 2023-08-18 DIAGNOSIS — K8070 Calculus of gallbladder and bile duct without cholecystitis without obstruction: Secondary | ICD-10-CM | POA: Diagnosis not present

## 2023-08-18 DIAGNOSIS — K660 Peritoneal adhesions (postprocedural) (postinfection): Secondary | ICD-10-CM | POA: Diagnosis not present

## 2023-08-18 DIAGNOSIS — K8064 Calculus of gallbladder and bile duct with chronic cholecystitis without obstruction: Secondary | ICD-10-CM | POA: Diagnosis not present

## 2023-08-18 DIAGNOSIS — K8301 Primary sclerosing cholangitis: Secondary | ICD-10-CM | POA: Diagnosis not present

## 2023-08-18 DIAGNOSIS — K811 Chronic cholecystitis: Secondary | ICD-10-CM | POA: Diagnosis not present

## 2023-08-21 ENCOUNTER — Ambulatory Visit (INDEPENDENT_AMBULATORY_CARE_PROVIDER_SITE_OTHER): Payer: Medicare Other | Admitting: Gastroenterology

## 2023-08-23 ENCOUNTER — Other Ambulatory Visit (INDEPENDENT_AMBULATORY_CARE_PROVIDER_SITE_OTHER): Payer: Self-pay | Admitting: *Deleted

## 2023-08-23 ENCOUNTER — Encounter (INDEPENDENT_AMBULATORY_CARE_PROVIDER_SITE_OTHER): Payer: Self-pay | Admitting: *Deleted

## 2023-08-23 ENCOUNTER — Telehealth: Payer: Self-pay

## 2023-08-23 DIAGNOSIS — E559 Vitamin D deficiency, unspecified: Secondary | ICD-10-CM

## 2023-08-23 NOTE — Telephone Encounter (Signed)
I called patient at every number listed in chart including spouses number.  No answer at any number and I was unable to leave a voicemail at any listed number.  Dr. Ronne Binning confirmed that he would remove patient stent.  I scheduled patient for 12/18 and sent an appt reminder via mail.  I will also notify patient via mychart.

## 2023-08-23 NOTE — Telephone Encounter (Signed)
-----   Message from Bjorn Pippin sent at 08/22/2023 11:56 AM EST ----- This fellow some how either didn't get scheduled for or missed his f/u appointment.   He needs to come in ASAP for cystoscopy and bilateral stent removal.   He could have that done by any available MD if some one else can do him sooner than me.   Thanks.

## 2023-08-29 NOTE — Telephone Encounter (Signed)
Unable to reach patient by phone regarding tomorrow's appt.  Mychart message was read by patient informing him of upcoming appt.

## 2023-08-30 ENCOUNTER — Ambulatory Visit (INDEPENDENT_AMBULATORY_CARE_PROVIDER_SITE_OTHER): Payer: Medicare Other | Admitting: Urology

## 2023-08-30 VITALS — BP 127/79 | HR 112

## 2023-08-30 DIAGNOSIS — Z466 Encounter for fitting and adjustment of urinary device: Secondary | ICD-10-CM

## 2023-08-30 DIAGNOSIS — N200 Calculus of kidney: Secondary | ICD-10-CM

## 2023-08-30 MED ORDER — CIPROFLOXACIN HCL 500 MG PO TABS
500.0000 mg | ORAL_TABLET | Freq: Once | ORAL | Status: AC
  Administered 2023-08-30: 500 mg via ORAL

## 2023-08-30 NOTE — Progress Notes (Signed)
   08/30/23  CC: stent removal   HPI: Alex Thomas is a 67yo here for ureteral stent removal Blood pressure 127/79, pulse (!) 112. NED. A&Ox3.   No respiratory distress   Abd soft, NT, ND Normal phallus with bilateral descended testicles  Cystoscopy Procedure Note  Patient identification was confirmed, informed consent was obtained, and patient was prepped using Betadine solution.  Lidocaine jelly was administered per urethral meatus.     Pre-Procedure: - Inspection reveals a normal caliber ureteral meatus.  Procedure: The flexible cystoscope was introduced without difficulty - No urethral strictures/lesions are present. - Enlarged prostate  - Normal bladder neck - Bilateral ureteral orifices identified - Bladder mucosa  reveals no ulcers, tumors, or lesions - No bladder stones - No trabeculation  Using a grasper the bilateral ureteral stents were removed intact  Post-Procedure: - Patient tolerated the procedure well  Assessment/ Plan: Followup 6 weeks with a renal US  No follow-ups on file.  Wilkie Aye, MD

## 2023-09-01 ENCOUNTER — Ambulatory Visit (HOSPITAL_COMMUNITY): Admission: RE | Admit: 2023-09-01 | Payer: Medicare Other | Source: Ambulatory Visit

## 2023-09-12 ENCOUNTER — Encounter: Payer: Self-pay | Admitting: Urology

## 2023-09-14 ENCOUNTER — Encounter: Payer: Medicare Other | Attending: Gastroenterology | Admitting: Internal Medicine

## 2023-09-14 VITALS — BP 116/75 | HR 59 | Temp 97.6°F | Resp 16

## 2023-09-14 DIAGNOSIS — K51019 Ulcerative (chronic) pancolitis with unspecified complications: Secondary | ICD-10-CM | POA: Diagnosis not present

## 2023-09-14 MED ORDER — SODIUM CHLORIDE 0.9 % IV SOLN
300.0000 mg | Freq: Once | INTRAVENOUS | Status: AC
  Administered 2023-09-14: 300 mg via INTRAVENOUS
  Filled 2023-09-14: qty 5

## 2023-09-14 NOTE — Progress Notes (Signed)
 Diagnosis: Ulcerative Colitis  Provider:  Eartha Sieving MD  Procedure: IV Infusion  IV Type: Peripheral, IV Location: Left wrist  Entyvio  (Vedolizumab ), Dose: 300 mg  Infusion Start Time: 0952  Infusion Stop Time: 1033  Post Infusion IV Care: Observation period completed  Discharge: Condition: Good, Destination: Home . AVS Provided  Performed by:  Blanca Selinda SAUNDERS, LPN

## 2023-09-21 DIAGNOSIS — K51019 Ulcerative (chronic) pancolitis with unspecified complications: Secondary | ICD-10-CM | POA: Diagnosis not present

## 2023-09-29 ENCOUNTER — Ambulatory Visit (HOSPITAL_COMMUNITY)
Admission: RE | Admit: 2023-09-29 | Discharge: 2023-09-29 | Disposition: A | Discharge status: Home / Self Care | Payer: Medicare Other | Source: Ambulatory Visit | Attending: Urology | Admitting: Urology

## 2023-09-29 DIAGNOSIS — N132 Hydronephrosis with renal and ureteral calculous obstruction: Secondary | ICD-10-CM | POA: Diagnosis not present

## 2023-09-29 DIAGNOSIS — N200 Calculus of kidney: Secondary | ICD-10-CM | POA: Insufficient documentation

## 2023-10-05 ENCOUNTER — Ambulatory Visit (INDEPENDENT_AMBULATORY_CARE_PROVIDER_SITE_OTHER): Payer: Medicare Other | Admitting: Gastroenterology

## 2023-10-05 NOTE — Progress Notes (Signed)
Cardiology Office Note    Date:  10/09/2023  ID:  [name], DOB [birth year], MRN [id number] PCP:  Benita Stabile, MD  Cardiologist:  Dina Rich, MD  Electrophysiologist:  None   Chief Complaint: f/u afib  History of Present Illness: .    Alex Thomas is a 68 y.o. 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 He established care with Dr. Wyline Mood 06/2023 for PAF, reported new diagnosis during procedure 04/2023. Dr. Wyline Mood reviewed EKG from [facility] 05/02/23 which showed AF rate 66. At PCP f/u he was back in NSR. Dr. Wyline Mood also noted HR 111 at GI visit earlier in the month. CHADSVASC was 1 for age therefore anticoagulation deferred. Per rec, "In the futue if ever considered would need to clear with GI given his PUD, ulcerative colitis, and liver history." Echo 07/20/23 showed EF 60-65%, G1DD, trivial MR/AI. Event monitor resulted with 368 runs of SVT longest 2 minutes 12 seconds, 1.1% PVCs, <1% PACs, 2nd degree Mobitz 1 AVB present (11:49pm - no sustained pauses), <1% episodes of rate controlled AFib. I confirmed with Dr. Wyline Mood that PAF seen but very low burden.  The paitent returns for follow-up here feeling well without any recent cardiac symptoms. He has been unaware of any recent palpitations. No CP, SOB, syncope, edema, diaphoresis. He reports a remote sleep study 10 years ago+ that showed mild OSA and did not require treatment at that time. He has not had re-eval since the PAF was diagnosed. He sees his PCP early February for f/u. He was unaware that outside labs 08/2023 showed elevated potassium and elevated kidney function.  Labwork independently reviewed: [facility] DXA 08/2023 Hgb 13.2, plt 336, K 5.6, Cr 1.45, alk phos 337, AST 47, ALT OK 06/2023 K 4.1, Cr 0.65, albumin 3.3, alk phos 366, AST ALT OK, Hgb 14, plt 401 03/2023 TSH  wnl  ROS: .    Please see the history of present illness. All other systems are reviewed and otherwise negative.  Studies Reviewed: Marland Kitchen    EKG:  EKG is ordered today, personally reviewed, demonstrating NSR 62bpm, no acute STT changes  CV Studies: Cardiac studies reviewed are outlined and summarized above. Otherwise please see EMR for full report.   Current Reported Medications:.    Current Meds  Medication Sig   Cholecalciferol (VITAMIN D) 125 MCG (5000 UT) CAPS Take 10,000 Units by mouth daily.   Cyanocobalamin (VITAMIN B-12) 1000 MCG SUBL Place 2 tablets (2,000 mcg total) under the tongue daily.   folic acid (FOLVITE) 1 MG tablet TAKE ONE (1) TABLET BY MOUTH EVERY DAY   ketoconazole (NIZORAL) 2 % cream Apply 1 Application topically daily as needed for irritation.   vedolizumab (ENTYVIO) 300 MG injection Inject 300 mg into the vein every 28 (twenty-eight) days.    Physical Exam:    VS:  BP 120/66 (BP Location: Left Arm, Patient Position: Sitting, Cuff Size: Large)   Pulse 69   Ht 6\' 3"  (1.905 m)   Wt (!) 311 lb 12.8 oz (141.4 kg)   SpO2 97%   BMI 38.97 kg/m    Wt Readings from Last 3 Encounters:  10/09/23 (!) 311 lb 12.8 oz (141.4 kg)  10/09/23 (!) 312 lb 6.4 oz (141.7 kg)  07/25/23 293 lb (132.9 kg)    GEN: Well nourished, well developed in no acute  distress NECK: No JVD; No carotid bruits CARDIAC: RRR, no murmurs, rubs, gallops RESPIRATORY:  Clear to auscultation without rales, wheezing or rhonchi  ABDOMEN: Soft, non-tender, non-distended EXTREMITIES:  No edema; No acute deformity   Asessement and Plan:.    1. Paroxysmal atrial fib - I discussed his plan with Dr. Wyline Mood. CHADSVASC was felt to be 1 for age. Patient has history of aortic atherosclerosis noted on outside CT 2023 but Dr. Wyline Mood reports he would not consider this as part of the scoring unless he had prior cardiovascular event which the patient has has not. Furthermore, Dr. Wyline Mood affirms there is concern  about high bleeding risk given his significant history of PSC as well as gastric ulcers. He reports he would be hesitant to anticoagulate even if CHADSVASC were 2 in the setting of low AF burden (<1% by monitor) and GI co-morbidities. The patient denies any interim symptoms as well. Therefore per our discussion we will continue to follow clinically. I discussed with Mr. Wolke the use of a smart watch to surveil for breakthrough atrial fibrillation and notify us if he receives any alerts. He also has history of mild OSA remotely. This needs re-evaluation given the diagnosis of afib, given the relationship between the two. Will refer to [facility] to revisit updated sleep study and therapy if needed. Will also update TSH, BMET, Mg with screening A1c for completeness.  2. PSVT, PACs/PVCs, also with incidental Mobitz 1 AVB - per discussion with MD, in the absence of any cardiac symptoms and fairly low burden, would continue to follow ectopy clinically for now and hold off maintenance AVN blocking agent given underlying Mobitz 1 AVB. The latter may be due to OSA, referring to pulm as above.   3. OSA - mild by sleep study 10 years ago per patient. Refer to [facility] to revisit evaluation.  4. AKI, hyperkalemia - noted on 08/2023 labs, recheck BMET today.    Disposition: F/u with Dr. Wyline Mood in 6 months.  Signed, Laurann Montana, PA-C

## 2023-10-09 ENCOUNTER — Encounter: Payer: Self-pay | Admitting: Physician Assistant

## 2023-10-09 ENCOUNTER — Encounter (INDEPENDENT_AMBULATORY_CARE_PROVIDER_SITE_OTHER): Payer: Self-pay | Admitting: Gastroenterology

## 2023-10-09 ENCOUNTER — Ambulatory Visit (INDEPENDENT_AMBULATORY_CARE_PROVIDER_SITE_OTHER): Payer: Medicare Other | Admitting: Gastroenterology

## 2023-10-09 ENCOUNTER — Ambulatory Visit: Payer: Medicare Other | Attending: Physician Assistant | Admitting: Physician Assistant

## 2023-10-09 ENCOUNTER — Other Ambulatory Visit (HOSPITAL_COMMUNITY)
Admission: RE | Admit: 2023-10-09 | Discharge: 2023-10-09 | Disposition: A | Discharge status: Home / Self Care | Payer: Medicare Other | Source: Ambulatory Visit | Attending: Physician Assistant | Admitting: Physician Assistant

## 2023-10-09 VITALS — BP 120/66 | HR 69 | Ht 75.0 in | Wt 311.8 lb

## 2023-10-09 VITALS — BP 137/82 | HR 72 | Temp 97.7°F | Ht 75.0 in | Wt 312.4 lb

## 2023-10-09 DIAGNOSIS — K5100 Ulcerative (chronic) pancolitis without complications: Secondary | ICD-10-CM

## 2023-10-09 DIAGNOSIS — I480 Paroxysmal atrial fibrillation: Secondary | ICD-10-CM | POA: Diagnosis not present

## 2023-10-09 DIAGNOSIS — K760 Fatty (change of) liver, not elsewhere classified: Secondary | ICD-10-CM

## 2023-10-09 DIAGNOSIS — N179 Acute kidney failure, unspecified: Secondary | ICD-10-CM

## 2023-10-09 DIAGNOSIS — E875 Hyperkalemia: Secondary | ICD-10-CM | POA: Diagnosis not present

## 2023-10-09 DIAGNOSIS — K639 Disease of intestine, unspecified: Secondary | ICD-10-CM | POA: Diagnosis not present

## 2023-10-09 DIAGNOSIS — I493 Ventricular premature depolarization: Secondary | ICD-10-CM | POA: Diagnosis not present

## 2023-10-09 DIAGNOSIS — Z131 Encounter for screening for diabetes mellitus: Secondary | ICD-10-CM

## 2023-10-09 DIAGNOSIS — G4733 Obstructive sleep apnea (adult) (pediatric): Secondary | ICD-10-CM | POA: Diagnosis not present

## 2023-10-09 DIAGNOSIS — R7989 Other specified abnormal findings of blood chemistry: Secondary | ICD-10-CM | POA: Diagnosis not present

## 2023-10-09 DIAGNOSIS — I491 Atrial premature depolarization: Secondary | ICD-10-CM | POA: Diagnosis not present

## 2023-10-09 DIAGNOSIS — I471 Supraventricular tachycardia, unspecified: Secondary | ICD-10-CM | POA: Diagnosis not present

## 2023-10-09 DIAGNOSIS — I441 Atrioventricular block, second degree: Secondary | ICD-10-CM | POA: Diagnosis not present

## 2023-10-09 LAB — BASIC METABOLIC PANEL
Anion gap: 7 (ref 5–15)
BUN: 15 mg/dL (ref 8–23)
CO2: 25 mmol/L (ref 22–32)
Calcium: 9.2 mg/dL (ref 8.9–10.3)
Chloride: 105 mmol/L (ref 98–111)
Creatinine, Ser: 1.07 mg/dL (ref 0.61–1.24)
GFR, Estimated: >60 mL/min (ref >60)
Glucose, Bld: 101 mg/dL — ABNORMAL HIGH (ref 70–99)
Potassium: 4.5 mmol/L (ref 3.5–5.1)
Sodium: 137 mmol/L (ref 135–145)

## 2023-10-09 LAB — MAGNESIUM: Magnesium: 2.1 mg/dL (ref 1.7–2.4)

## 2023-10-09 LAB — HEMOGLOBIN A1C
Hgb A1c MFr Bld: 5.3 % (ref 4.8–5.6)
Mean Plasma Glucose: 105.41 mg/dL

## 2023-10-09 LAB — TSH: TSH: 2.025 u[IU]/mL (ref 0.350–4.500)

## 2023-10-09 NOTE — Patient Instructions (Addendum)
Proceed with blood and stool workup on 10/12/2023 Continue with Entyvio every 4 weeks for now, may discuss other treatment options depending on results of blood testing Please discussed with Dr. Secundino Ginger if proceeding with colonoscopy at [facility] or if okay to to have this done locally in May Follow-up with Dr. Sue Lush Advance diet as tolerated Please ask PCP for flu, pneumonia and shingles vaccination

## 2023-10-09 NOTE — Progress Notes (Unsigned)
Alex Thomas, M.D. Gastroenterology & Hepatology [facility] Gastroenterology [address] [place], [place] [address]  Primary Care Physician: Alex Stabile, Alex Thomas [address] [place] [address]  I will communicate my assessment and recommendations to the referring Alex Thomas via EMR.  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  The patient was last seen on 07/03/2023. At that time, the patient was advised to continue Entyvio every 4 weeks and was advised to follow-up with [facility] for colonoscopy.  Patient underwent endoscopic retrograde cholangiopancreatography on 12 11/01/2022 with Alex Thomas.  Patient was found to have diffuse subtle ductal irregularities were found in  the upper third of the main bile duct and intrahepatic  biliary tree consistent with previous history of  primary sclerosing cholangitis.  Found to have choledocholithiasis with complete removal with balloon extraction.  No stent was left.  Patient underwent cholecystectomy on 08/18/2023 at [facility].  Patient saw Alex Thomas on 09/21/23 . Recommended repeat colonoscopy in Spring. Patient was recommended Entyvio every 4 weeks, have Entyvio levels checked before next dose and calprotectin.  Next Entyvio dose on 10/13/2023.  Patient has follow up appointment with Alex Thomas in 12/2023.  Most recent labs from 08/16/2023 showed that CBC with WBC 10.3, hemoglobin 13.2, platelets 336, CMP with creatinine 1.45, BUN 21, sodium 143, potassium 5.6, AST 47, ALT 25, alkaline  phosphatase 337, albumin 3.8.  Patient reports that after he had cholecystectomy, his stool is more solid (not watery). Moves his bowels daily x1 but sometimes twice a day. The patient denies having any nausea, vomiting, fever, chills, hematochezia, melena, hematemesis, abdominal distention, abdominal pain, jaundice, pruritus. Has gained 10 lb since his lap choly.  Last flu shot: 2 years ago Last pneumonia shot:never Last evaluation by dermatology: possibly 8 months Last zoster vaccine: never COVID-19 shot: received 2 doses  Last Colonoscopy at [facility]: 09/2022  The perianal and digital rectal examinations were normal.   The terminal ileum appeared normal.   Inflammation was found in a continuous and circumferential pattern from the descending colon ( 50 cm from anal verge) to the cecum. This was graded as Mayo Score 1 ( mild, with erythema, decreased vascular pattern, mild friability) , and when compared to the previous examination, the findings are improved as there were no erosions and vascularity appeared to be more preserved. No inflammation ( Mayo 0) was found distal to 50 cm. Biopsies were taken with a cold forceps for histology every 10 cm.   One 15 to 20 mm mucosal nodularity was found in the mid ascending colon. Area was successfully injected with 20 mL Eleview for lesion assessment, but the lesion could not be lifted adequately and remained very flat so no polypectomy could be performed. Biopsies were taken with a cold forceps for histology. A contralateral area 2 cm distal to the nodularity was tattooed with an injection of 1 mL of Spot ( carbon black) .   The retroflexed view of the distal rectum and anal verge was normal and showed no anal or rectal abnormalities.   FINAL MICROSCOPIC DIAGNOSIS:  A. COLON, ASCENDING,  NODULE, BIOPSY: - Consistent with inflammatory polyp - Negative for dysplasia  B. COLON, 90 CM, BIOPSY: - Mildly active chronic colitis, consistent with patient's  clinical history of ulcerative colitis - Negative for granulomas or dysplasia  C. COLON, 80 CM, BIOPSY: - Mildly active chronic colitis, consistent with patient's clinical history of ulcerative colitis - Negative for granulomas or dysplasia  D. COLON, 70 CM, BIOPSY: - Mildly active chronic colitis, consistent with patient's clinical history of ulcerative colitis - Negative for granulomas or dysplasia  E. COLON, 60 CM, BIOPSY: - Mildly active chronic colitis, consistent with patient's clinical history of ulcerative colitis - Negative for granulomas or dysplasia  F. COLON, 50 CM, BIOPSY: - Low-grade dysplasia arising in background of mildly active chronic ulcerative colitis, see comment - Negative for granulomas  G. COLON, 40 CM, BIOPSY: - Mildly active chronic colitis, consistent with patient's clinical history of ulcerative colitis - Negative for granulomas or dysplasia  H. COLON, 30 CM, BIOPSY: - Mildly active chronic colitis, consistent with patient's clinical history of ulcerative colitis - Negative for granulomas or dysplasia  I. COLON, 20 CM, BIOPSY: - Mildly active chronic colitis, consistent with patient's clinical history of ulcerative colitis - Negative for granulomas or dysplasia  J. COLON, 10 CM, BIOPSY: - Inactive chronic proctitis, consistent with patient's clinical history of ulcerative colitis - Negative for granulomas or dysplasia  COMMENT:  F.  Some foci show increased cytologic atypia than usually seen in low-grade dysplasia but clear-cut high-grade dysplasia is not identified.  Alex Thomas reviewed the case and concurs with the above diagnosis.    Presence of low-grade dysplasia at 50 cm -random biopsies.  This was confirmed by 2 pathologists.  Thorough discussion was held with the patient regarding the significance of low-grade dysplasia. discussed that this is classified as invisible dysplasia.  discussed the importance of having an evaluation  at a tertiary center such as [facility] with an inflammatory bowel disease group that can evaluate this further with chromoendoscopy and potential resection if the lesion is visible/resectable.  Also, depending on the findings, he may be better served if there is need for surgical intervention.  Last colonoscopy at [facility] 07/21/2023  Impression: - Preparation of the colon was fair. - The examined portion of the ileum was normal. - Colitis. Inflammation was found from the rectum to  the cecum with diffuse mild erythema but patchy areas  of more pronounced granular and friable mucosa. This  was moderate in severity and graded as Mayo Score 2  (moderate disease). Multiple biopsies were taken by  segment. - One 3 mm polyp in the ascending colon, removed with  a cold snare. Resected and retrieved. - Diverticulosis in the left colon. - Internal hemorrhoids.   Path A. Colon, right, endoscopic biopsy: Moderate chronic active colitis. Negative for dysplasia.  B. Colon polyp, right, endoscopic polypectomy: Inflammatory pseudopolyp.   C. Colon, transverse, endoscopic biopsy: Chronic active colitis. Negative for dysplasia.   D. Colon, descending, endoscopic biopsy: Low grade dysplasia involving one out of six fragments, arising in a background of chronic active colitis.  E. Colon, sigmoid, endoscopic biopsy: Chronic active colitis. Negative for dysplasia.  F. Rectum, endoscopic biopsy: Colonic mucosa with no significant pathologic alteration. No active or chronic colitis is seen. Negative for dysplasia.   Past Medical History: Past Medical History:  Diagnosis Date   Arthritis    Bladder cancer [facility])    age 82   Dysrhythmia    History of kidney stones  Multiple gastric ulcers    Pneumonia    when young    Past Surgical History: Past Surgical History:  Procedure Laterality Date   APPENDECTOMY     BIOPSY  11/23/2021   Procedure: BIOPSY;  Surgeon: Alex Frame, Alex Thomas;   [facility];  Service: Gastroenterology;;   BIOPSY  09/23/2022   Procedure: BIOPSY;  Surgeon: Alex Frame, Alex Thomas;  [facility];  Service: Gastroenterology;;   COLONOSCOPY WITH PROPOFOL N/A 11/23/2021   Procedure: COLONOSCOPY WITH PROPOFOL;  Surgeon: Alex Frame, Alex Thomas;  [facility];  Service: Gastroenterology;  Laterality: N/A;  805 ASA 1   COLONOSCOPY WITH PROPOFOL N/A 09/23/2022   Procedure: COLONOSCOPY WITH PROPOFOL;  Surgeon: Alex Frame, Alex Thomas;  [facility];  Service: Gastroenterology;  Laterality: N/A;  1:00pm, asa 2, pt knows to arrive at 10:00   CYSTOSCOPY/URETEROSCOPY/HOLMIUM LASER/STENT PLACEMENT Bilateral 07/25/2023   Procedure: CYSTOSCOPY BILATERAL URETEROSCOPY/RETROGRADE PYELOGRAM/HOLMIUM LASER/STENT PLACEMENT;  Surgeon: Alex Pippin, Alex Thomas;  Location: [facility];  Service: Urology;  Laterality: Bilateral;  2 HRS FOR CASE   GASTRECTOMY     HERNIA REPAIR     KIDNEY STONE SURGERY     PERCUTANEOUS NEPHROLITHOTRIPSY     POLYPECTOMY  11/23/2021   Procedure: POLYPECTOMY;  Surgeon: Alex Frame, Alex Thomas;  [facility];  Service: Gastroenterology;;   Susa Day  09/23/2022   Procedure: Susa Day;  Surgeon: Alex Thomas, Alex Boom, Alex Thomas;  [facility];  Service: Gastroenterology;;    Family History: Family History  Problem Relation Age of Onset   Colon cancer Mother     Social History: Social History   Tobacco Use  Smoking Status Never   Passive exposure: Past  Smokeless Tobacco Never   Social History   Substance and Sexual Activity  Alcohol Use Not Currently   Comment: occ   Social History   Substance and Sexual Activity  Drug Use Not Currently    Allergies: No Known Allergies  Medications: Current Outpatient Medications  Medication Sig Dispense Refill   Cholecalciferol (VITAMIN D) 125 MCG (5000 UT) CAPS Take 10,000 Units by mouth daily.      Cyanocobalamin (VITAMIN B-12) 1000 MCG SUBL Place 2 tablets (2,000 mcg total) under the tongue daily. 180 tablet 1   folic acid (FOLVITE) 1 MG tablet TAKE ONE (1) TABLET BY MOUTH EVERY DAY 90 tablet 1   ketoconazole (NIZORAL) 2 % cream Apply 1 Application topically daily as needed for irritation.     vedolizumab (ENTYVIO) 300 MG injection Inject 300 mg into the vein every 28 (twenty-eight) days.     No current facility-administered medications for this visit.    Review of Systems: GENERAL: negative for malaise, night sweats HEENT: No changes in hearing or vision, no nose bleeds or other nasal problems. NECK: Negative for lumps, goiter, pain and significant neck swelling RESPIRATORY: Negative for cough, wheezing CARDIOVASCULAR: Negative for chest pain, leg swelling, palpitations, orthopnea GI: SEE HPI MUSCULOSKELETAL: Negative for joint pain or swelling, back pain, and muscle pain. SKIN: Negative for lesions, rash PSYCH: Negative for sleep disturbance, mood disorder and recent psychosocial stressors. HEMATOLOGY Negative for prolonged bleeding, bruising easily, and swollen nodes. ENDOCRINE: Negative for cold or heat intolerance, polyuria, polydipsia and goiter. NEURO: negative for tremor, gait imbalance, syncope and seizures. The remainder of the review of systems is noncontributory.   Physical Exam: BP 137/82 (BP Location: Left Arm, Patient Position: Sitting, Cuff Size: Large)   Pulse 72   Temp 97.7  F (36.5 C) (Oral)   Ht 6\' 3"  (1.905 m)   Wt (!) 312 lb 6.4 oz (141.7 kg)   BMI 39.05 kg/m  GENERAL: The patient is AO x3, in no acute distress. HEENT: Head is normocephalic and atraumatic. EOMI are intact. Mouth is well hydrated and without lesions. NECK: Supple. No masses LUNGS: Clear to auscultation. No presence of rhonchi/wheezing/rales. Adequate chest expansion HEART: RRR, normal s1 and s2. ABDOMEN: Soft, nontender, no guarding, no peritoneal signs, and nondistended. BS +. No  masses. EXTREMITIES: Without any cyanosis, clubbing, rash, lesions or edema. NEUROLOGIC: AOx3, no focal motor deficit. SKIN: no jaundice, no rashes  Imaging/Labs: as above  I personally reviewed and interpreted the available labs, imaging and endoscopic files.  Impression and Plan: Alex Thomas is a 68 y.o. 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  Patient has presented complex history as he was incidentally found to have ulcerative colitis during his screening colonoscopy.  Even though he has pancolitis, the severity of the disease mild.  We discussed again that as he has concomitant PSC, he has a very high risk of developing colorectal cancer.  In fact, he was found to have low-grade dysplasia in random biopsies in the ascending colon, which was confirmed during recent colonoscopy at [facility].  He is being actively followed with repeat colonoscopies as he had invisible dysplasia.  He will need to have a repeat colonoscopy in May 2025.  He will need to reach the team at [facility] to determine if he can have this done locally or if he needs to have this at [facility].  Notably, he had persistence of mildly active disease throughout his colon.  Due to this, we will check his Entyvio levels before his next dose and also check his calprotectin levels, as at this point he should have had at least 5 half-lives of the every 4-week dosing.  If his levels are adequate and calprotectin is elevated, we will consider switching to another agent.  Regarding his preventative measures, he should ask his PCP about flu, pneumonia and shingles vaccination.  She will continue surveillance with dermatology.  LFTs have been stable without any significant rise.  In fact, he has improved after undergoing ERCP with removal of  choledocholithiasis.  He is status post cholecystectomy.  He will follow-up with hepatology at [facility].  - Proceed with Entyvio levels and calprotectin test on 10/12/2023 - Continue with Entyvio every 4 weeks for now, may discuss other treatment options depending on results of blood testing - Pt to discuss with Alex Thomas if proceeding with colonoscopy at [facility] or if okay to to have this done locally in May - Follow-up with Alex Thomas - Advance diet as tolerated - Please ask PCP for flu, pneumonia and shingles vaccination  All questions were answered.      Alex Blazing, Alex Thomas Gastroenterology and Hepatology [facility] Gastroenterology

## 2023-10-09 NOTE — Patient Instructions (Signed)
Medication Instructions:  Your physician recommends that you continue on your current medications as directed. Please refer to the Current Medication list given to you today.  *If you need a refill on your cardiac medications before your next appointment, please call your pharmacy*   Lab Work: Your physician recommends that you return for lab work in: Today   If you have labs (blood work) drawn today and your tests are completely normal, you will receive your results only by: MyChart Message (if you have MyChart) OR A paper copy in the mail If you have any lab test that is abnormal or we need to change your treatment, we will call you to review the results.   Testing/Procedures: NONE    Follow-Up: At [facility], you and your health needs are our priority.  As part of our continuing mission to provide you with exceptional heart care, we have created designated Provider Care Teams.  These Care Teams include your primary Cardiologist (physician) and Advanced Practice Providers (APPs -  Physician Assistants and Nurse Practitioners) who all work together to provide you with the care you need, when you need it.  We recommend signing up for the patient portal called "MyChart".  Sign up information is provided on this After Visit Summary.  MyChart is used to connect with patients for Virtual Visits (Telemedicine).  Patients are able to view lab/test results, encounter notes, upcoming appointments, etc.  Non-urgent messages can be sent to your provider as well.   To learn more about what you can do with MyChart, go to [email or web address].    Your next appointment:   6 month(s)  Provider:   You may see Dina Rich, MD or one of the following Advanced Practice Providers on your designated Care Team:   [name], PA-C  [name], [place]     Other Instructions Thank you for choosing [facility]!

## 2023-10-11 DIAGNOSIS — R7989 Other specified abnormal findings of blood chemistry: Secondary | ICD-10-CM | POA: Diagnosis not present

## 2023-10-11 DIAGNOSIS — K5100 Ulcerative (chronic) pancolitis without complications: Secondary | ICD-10-CM | POA: Diagnosis not present

## 2023-10-12 ENCOUNTER — Ambulatory Visit: Payer: Medicare Other

## 2023-10-13 ENCOUNTER — Other Ambulatory Visit: Payer: Self-pay

## 2023-10-13 ENCOUNTER — Encounter: Payer: Medicare Other | Admitting: Emergency Medicine

## 2023-10-13 VITALS — BP 122/79 | HR 69 | Temp 98.3°F | Resp 16

## 2023-10-13 DIAGNOSIS — K51019 Ulcerative (chronic) pancolitis with unspecified complications: Secondary | ICD-10-CM

## 2023-10-13 MED ORDER — VEDOLIZUMAB 300 MG IV SOLR
300.0000 mg | Freq: Once | INTRAVENOUS | Status: AC
  Administered 2023-10-13: 300 mg via INTRAVENOUS
  Filled 2023-10-13: qty 5

## 2023-10-13 NOTE — Progress Notes (Signed)
Diagnosis: Ulcerative Colitis  Provider:  Katrinka Blazing MD  Procedure: IV Infusion  IV Type: Peripheral, IV Location: left wrist  Entyvio (Vedolizumab), Dose: 300 mg  Infusion Start Time: 1254  Infusion Stop Time: 1330  Post Infusion IV Care: Peripheral IV Discontinued  Discharge: Condition: Good, Destination: Home . AVS Provided  Performed by:  Arrie Senate, RN

## 2023-10-16 ENCOUNTER — Telehealth: Payer: Self-pay

## 2023-10-16 NOTE — Telephone Encounter (Signed)
Auth Submission: APPROVED Site of care: Site of care: [facility] Payer: [facility] medicare Medication & CPT/J Code(s) submitted: Entyvio (Vedolizumab) [id number] Route of submission (phone, fax, portal): portal Phone # Fax # Auth type: Buy/Bill PB Units/visits requested: 300mg , q4weeks Reference number: [id number] Approval from: 10/16/23 to 10/15/24   Pt gets free drug from 09/13/23 - 09/11/24

## 2023-10-18 DIAGNOSIS — E785 Hyperlipidemia, unspecified: Secondary | ICD-10-CM | POA: Diagnosis not present

## 2023-10-18 DIAGNOSIS — K760 Fatty (change of) liver, not elsewhere classified: Secondary | ICD-10-CM | POA: Diagnosis not present

## 2023-10-18 DIAGNOSIS — E559 Vitamin D deficiency, unspecified: Secondary | ICD-10-CM | POA: Diagnosis not present

## 2023-10-18 DIAGNOSIS — K5100 Ulcerative (chronic) pancolitis without complications: Secondary | ICD-10-CM | POA: Diagnosis not present

## 2023-10-18 DIAGNOSIS — Z23 Encounter for immunization: Secondary | ICD-10-CM | POA: Diagnosis not present

## 2023-10-18 DIAGNOSIS — D649 Anemia, unspecified: Secondary | ICD-10-CM | POA: Diagnosis not present

## 2023-10-18 DIAGNOSIS — E871 Hypo-osmolality and hyponatremia: Secondary | ICD-10-CM | POA: Diagnosis not present

## 2023-10-18 DIAGNOSIS — R17 Unspecified jaundice: Secondary | ICD-10-CM | POA: Diagnosis not present

## 2023-10-18 DIAGNOSIS — K8301 Primary sclerosing cholangitis: Secondary | ICD-10-CM | POA: Diagnosis not present

## 2023-10-18 DIAGNOSIS — Z87442 Personal history of urinary calculi: Secondary | ICD-10-CM | POA: Diagnosis not present

## 2023-10-18 DIAGNOSIS — D72829 Elevated white blood cell count, unspecified: Secondary | ICD-10-CM | POA: Diagnosis not present

## 2023-10-20 LAB — SERIAL MONITORING

## 2023-10-20 LAB — VEDOLIZUMAB AND ANTI-VEDO AB
Anti-Vedolizumab Antibody: <25 ng/mL
Vedolizumab: 14 ug/mL

## 2023-10-20 LAB — VITAMIN D 25 HYDROXY (VIT D DEFICIENCY, FRACTURES): Vit D, 25-Hydroxy: 16.9 ng/mL — ABNORMAL LOW (ref 30.0–100.0)

## 2023-11-02 ENCOUNTER — Ambulatory Visit: Payer: Medicare Other | Admitting: Urology

## 2023-11-10 ENCOUNTER — Encounter: Payer: Medicare Other | Attending: Gastroenterology | Admitting: Internal Medicine

## 2023-11-10 VITALS — BP 127/80 | HR 65 | Temp 98.0°F | Resp 16

## 2023-11-10 DIAGNOSIS — K51019 Ulcerative (chronic) pancolitis with unspecified complications: Secondary | ICD-10-CM | POA: Diagnosis not present

## 2023-11-10 MED ORDER — VEDOLIZUMAB 300 MG IV SOLR
300.0000 mg | Freq: Once | INTRAVENOUS | Status: AC
  Administered 2023-11-10: 300 mg via INTRAVENOUS
  Filled 2023-11-10: qty 5

## 2023-11-10 NOTE — Progress Notes (Signed)
 Diagnosis: Ulcerative Colitis  Provider:  Katrinka Blazing MD  Procedure: IV Infusion  IV Type: Peripheral, IV Location: L Hand  Entyvio (Vedolizumab), Dose: 300 mg  Infusion Start Time: 1304  Infusion Stop Time: 1334  Post Infusion IV Care: Observation period completed  Discharge: Condition: Good, Destination: Home . AVS Provided  Performed by:  Cleotilde Neer, LPN

## 2023-11-15 ENCOUNTER — Other Ambulatory Visit (INDEPENDENT_AMBULATORY_CARE_PROVIDER_SITE_OTHER): Payer: Self-pay

## 2023-11-15 DIAGNOSIS — E559 Vitamin D deficiency, unspecified: Secondary | ICD-10-CM

## 2023-11-15 DIAGNOSIS — R748 Abnormal levels of other serum enzymes: Secondary | ICD-10-CM

## 2023-11-15 DIAGNOSIS — K760 Fatty (change of) liver, not elsewhere classified: Secondary | ICD-10-CM

## 2023-11-15 DIAGNOSIS — K51019 Ulcerative (chronic) pancolitis with unspecified complications: Secondary | ICD-10-CM

## 2023-11-15 DIAGNOSIS — K5100 Ulcerative (chronic) pancolitis without complications: Secondary | ICD-10-CM

## 2023-11-15 DIAGNOSIS — R7989 Other specified abnormal findings of blood chemistry: Secondary | ICD-10-CM

## 2023-11-17 DIAGNOSIS — E559 Vitamin D deficiency, unspecified: Secondary | ICD-10-CM | POA: Diagnosis not present

## 2023-11-17 DIAGNOSIS — K5100 Ulcerative (chronic) pancolitis without complications: Secondary | ICD-10-CM | POA: Diagnosis not present

## 2023-11-17 DIAGNOSIS — K760 Fatty (change of) liver, not elsewhere classified: Secondary | ICD-10-CM | POA: Diagnosis not present

## 2023-11-17 DIAGNOSIS — K51019 Ulcerative (chronic) pancolitis with unspecified complications: Secondary | ICD-10-CM | POA: Diagnosis not present

## 2023-11-17 DIAGNOSIS — R748 Abnormal levels of other serum enzymes: Secondary | ICD-10-CM | POA: Diagnosis not present

## 2023-11-20 ENCOUNTER — Telehealth (INDEPENDENT_AMBULATORY_CARE_PROVIDER_SITE_OTHER): Payer: Self-pay | Admitting: *Deleted

## 2023-11-20 LAB — CALPROTECTIN, FECAL: Calprotectin, Fecal: 528 ug/g — ABNORMAL HIGH (ref 0–120)

## 2023-12-08 ENCOUNTER — Encounter: Payer: Medicare Other | Attending: Gastroenterology | Admitting: Internal Medicine

## 2023-12-08 VITALS — BP 113/78 | HR 69 | Temp 98.2°F | Resp 16

## 2023-12-08 DIAGNOSIS — K51019 Ulcerative (chronic) pancolitis with unspecified complications: Secondary | ICD-10-CM

## 2023-12-08 MED ORDER — VEDOLIZUMAB 300 MG IV SOLR
300.0000 mg | Freq: Once | INTRAVENOUS | Status: AC
  Administered 2023-12-08: 300 mg via INTRAVENOUS
  Filled 2023-12-08: qty 5

## 2023-12-08 NOTE — Telephone Encounter (Signed)
error 

## 2023-12-08 NOTE — Progress Notes (Signed)
 Diagnosis: Ulcerative Colitis  Provider:  Katrinka Blazing MD  Procedure: IV Infusion  IV Type: Peripheral, IV Location: L Forearm  Entyvio (Vedolizumab), Dose: 300 mg  Infusion Start Time: 1033  Infusion Stop Time: 1110  Post Infusion IV Care: Observation period completed  Discharge: Condition: Good, Destination: Home . AVS Provided  Performed by:  Cleotilde Neer, LPN

## 2023-12-25 ENCOUNTER — Ambulatory Visit (INDEPENDENT_AMBULATORY_CARE_PROVIDER_SITE_OTHER): Payer: Medicare Other | Admitting: Gastroenterology

## 2024-01-05 ENCOUNTER — Encounter: Attending: Gastroenterology | Admitting: Emergency Medicine

## 2024-01-05 VITALS — BP 116/76 | HR 64 | Temp 97.6°F | Resp 18

## 2024-01-05 DIAGNOSIS — K51019 Ulcerative (chronic) pancolitis with unspecified complications: Secondary | ICD-10-CM | POA: Diagnosis not present

## 2024-01-05 MED ORDER — VEDOLIZUMAB 300 MG IV SOLR
300.0000 mg | Freq: Once | INTRAVENOUS | Status: AC
  Administered 2024-01-05: 300 mg via INTRAVENOUS
  Filled 2024-01-05: qty 5

## 2024-01-05 MED ORDER — SODIUM CHLORIDE 0.9% FLUSH
10.0000 mL | Freq: Once | INTRAVENOUS | Status: AC | PRN
  Administered 2024-01-05: 30 mL

## 2024-01-05 NOTE — Progress Notes (Signed)
 Diagnosis: Ulcerative Colitis  Provider:  Samantha Cress MD  Procedure: IV Infusion  IV Type: Peripheral, IV Location: L Hand  Entyvio  (Vedolizumab ), Dose: 300 mg  Infusion Start Time: 1025  Infusion Stop Time: 1102  Post Infusion IV Care: Peripheral IV Discontinued  Discharge: Condition: Good, Destination: Home . AVS Provided  Performed by:  Arlina Benjamin, RN

## 2024-01-08 ENCOUNTER — Encounter (INDEPENDENT_AMBULATORY_CARE_PROVIDER_SITE_OTHER): Payer: Self-pay | Admitting: Gastroenterology

## 2024-01-08 ENCOUNTER — Ambulatory Visit (INDEPENDENT_AMBULATORY_CARE_PROVIDER_SITE_OTHER): Admitting: Gastroenterology

## 2024-01-08 VITALS — BP 133/77 | HR 81 | Temp 97.1°F | Ht 75.0 in | Wt 317.6 lb

## 2024-01-08 DIAGNOSIS — K760 Fatty (change of) liver, not elsewhere classified: Secondary | ICD-10-CM | POA: Diagnosis not present

## 2024-01-08 DIAGNOSIS — K639 Disease of intestine, unspecified: Secondary | ICD-10-CM

## 2024-01-08 DIAGNOSIS — K5100 Ulcerative (chronic) pancolitis without complications: Secondary | ICD-10-CM | POA: Diagnosis not present

## 2024-01-08 DIAGNOSIS — K8301 Primary sclerosing cholangitis: Secondary | ICD-10-CM | POA: Diagnosis not present

## 2024-01-08 NOTE — Patient Instructions (Addendum)
 Schedule colonoscopy If presence of ongoing inflammation, will favor switching to infliximab Continue with Entyvio  every 4 weeks Will perform routine blood workup at time of colonoscopy

## 2024-01-08 NOTE — Progress Notes (Signed)
 Alex Thomas, M.D. Gastroenterology & Hepatology [facility] [address] [place], [place] [address]  Primary Care Physician: Omie Bickers, MD [address] [place] [address]  I will communicate my assessment and recommendations to the referring MD via EMR.  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  The patient was last seen on 10/09/2023. At that time, the patient was advised to continue Entyvio  every 4 weeks.  He had vedolizumab  levels checked on 10/11/2023 which were adequate (titer of 14).  Fecal calprotectin on 11/17/2023 was elevated at 528.  Patient reports that he is having 1-2 bowel movvements per day, mostly formed unless he drinks juice. The patient denies having any nausea, vomiting, fever, chills, hematochezia, melena, hematemesis, abdominal distention, abdominal pain, diarrhea, jaundice, pruritus or weight loss.  Patient has an appointment with Dr. Crist Dominion In 01/25/2024. However,  Last flu shot:2024 Last pneumonia shot:2025 Last evaluation by dermatology: possibly 8 months Last zoster vaccine: 2025 COVID-19 shot: received 2 doses   Last Colonoscopy at [facility]: 09/2022  The perianal and digital rectal examinations were normal.   The terminal ileum appeared normal.   Inflammation was found in a continuous and circumferential pattern from the descending colon ( 50 cm from anal verge) to the cecum. This was graded as Mayo Score 1 (  mild, with erythema, decreased vascular pattern, mild friability) , and when compared to the previous examination, the findings are improved as there were no erosions and vascularity appeared to be more preserved. No inflammation ( Mayo 0) was found distal to 50 cm. Biopsies were taken with a cold forceps for histology every 10 cm.   One 15 to 20 mm mucosal nodularity was found in the mid ascending colon. Area was successfully injected with 20 mL Eleview for lesion assessment, but the lesion could not be lifted adequately and remained very flat so no polypectomy could be performed. Biopsies were taken with a cold forceps for histology. A contralateral area 2 cm distal to the nodularity was tattooed with an injection of 1 mL of Spot ( carbon black) .   The retroflexed view of the distal rectum and anal verge was normal and showed no anal or rectal abnormalities.   FINAL MICROSCOPIC DIAGNOSIS:  A. COLON, ASCENDING, NODULE, BIOPSY: - Consistent with inflammatory polyp - Negative for dysplasia  B. COLON, 90 CM, BIOPSY: - Mildly active chronic colitis, consistent with patient's clinical history of ulcerative colitis - Negative for granulomas or dysplasia  C. COLON, 80 CM, BIOPSY: - Mildly active chronic colitis, consistent with patient's clinical history of ulcerative colitis - Negative for granulomas or dysplasia  D. COLON, 70 CM, BIOPSY: - Mildly active chronic colitis, consistent with patient's clinical history of ulcerative colitis - Negative for granulomas or dysplasia  E. COLON, 60 CM, BIOPSY: - Mildly active chronic colitis, consistent with patient's clinical history of ulcerative colitis - Negative for granulomas or dysplasia  F. COLON, 50 CM, BIOPSY: - Low-grade dysplasia arising in background of mildly active chronic ulcerative colitis, see comment - Negative for granulomas  G.  COLON, 40 CM, BIOPSY: - Mildly active chronic colitis, consistent with patient's  clinical history of ulcerative colitis - Negative for granulomas or dysplasia  H. COLON, 30 CM, BIOPSY: - Mildly active chronic colitis, consistent with patient's clinical history of ulcerative colitis - Negative for granulomas or dysplasia  I. COLON, 20 CM, BIOPSY: - Mildly active chronic colitis, consistent with patient's clinical history of ulcerative colitis - Negative for granulomas or dysplasia  J. COLON, 10 CM, BIOPSY: - Inactive chronic proctitis, consistent with patient's clinical history of ulcerative colitis - Negative for granulomas or dysplasia  COMMENT:  F.  Some foci show increased cytologic atypia than usually seen in low-grade dysplasia but clear-cut high-grade dysplasia is not identified.  Dr. LeGolvan reviewed the case and concurs with the above diagnosis.    Presence of low-grade dysplasia at 50 cm -random biopsies.  This was confirmed by 2 pathologists.  Thorough discussion was held with the patient regarding the significance of low-grade dysplasia. discussed that this is classified as invisible dysplasia.  discussed the importance of having an evaluation at a tertiary center such as [facility] with an inflammatory bowel disease group that can evaluate this further with chromoendoscopy and potential resection if the lesion is visible/resectable.  Also, depending on the findings, he may be better served if there is need for surgical intervention.   Last colonoscopy at [facility] 07/21/2023  Impression: - Preparation of the colon was fair. - The examined portion of the ileum was normal. - Colitis. Inflammation was found from the rectum to  the cecum with diffuse mild erythema but patchy areas  of more pronounced granular and friable mucosa. This  was moderate in severity and graded as Mayo Score 2  (moderate disease). Multiple biopsies were taken by  segment. - One 3 mm polyp in the ascending colon, removed with  a cold snare. Resected and retrieved. - Diverticulosis in  the left colon. - Internal hemorrhoids.    Path A. Colon, right, endoscopic biopsy: Moderate chronic active colitis. Negative for dysplasia.  B. Colon polyp, right, endoscopic polypectomy: Inflammatory pseudopolyp.   C. Colon, transverse, endoscopic biopsy: Chronic active colitis. Negative for dysplasia.   D. Colon, descending, endoscopic biopsy: Low grade dysplasia involving one out of six fragments, arising in a background of chronic active colitis.  E. Colon, sigmoid, endoscopic biopsy: Chronic active colitis. Negative for dysplasia.  F. Rectum, endoscopic biopsy: Colonic mucosa with no significant pathologic alteration. No active or chronic colitis is seen. Negative for dysplasia.   Past Medical History: Past Medical History:  Diagnosis Date   Arthritis    Bladder cancer [facility])    age 42   Dysrhythmia    History of kidney stones    Multiple gastric ulcers    Pneumonia    when young    Past Surgical History: Past Surgical History:  Procedure Laterality Date   APPENDECTOMY     BIOPSY  11/23/2021   Procedure: BIOPSY;  Surgeon: Urban Garden, MD;  Location: [facility];  Service: Gastroenterology;;   BIOPSY  09/23/2022   Procedure: BIOPSY;  Surgeon: Urban Garden, MD;  [facility]: [facility];  Service: Gastroenterology;;   COLONOSCOPY WITH PROPOFOL  N/A 11/23/2021   Procedure: COLONOSCOPY WITH PROPOFOL ;  Surgeon: Urban Garden, MD;  Location: [facility];  Service: Gastroenterology;  Laterality: N/A;  805 ASA 1   COLONOSCOPY WITH PROPOFOL  N/A 09/23/2022   Procedure: COLONOSCOPY WITH PROPOFOL ;  Surgeon: Urban Garden, MD;  Location: [facility];  Service: Gastroenterology;  Laterality: N/A;  1:00pm, asa 2, pt knows to arrive at 10:00   CYSTOSCOPY/URETEROSCOPY/HOLMIUM LASER/STENT PLACEMENT Bilateral 07/25/2023   Procedure: CYSTOSCOPY BILATERAL URETEROSCOPY/RETROGRADE PYELOGRAM/HOLMIUM LASER/STENT PLACEMENT;   Surgeon: Homero Luster, MD;  Location: [facility];  Service: Urology;  Laterality: Bilateral;  2 HRS FOR CASE   GASTRECTOMY     HERNIA REPAIR     KIDNEY STONE SURGERY     PERCUTANEOUS NEPHROLITHOTRIPSY     POLYPECTOMY  11/23/2021   Procedure: POLYPECTOMY;  Surgeon: Urban Garden, MD;  Location: [facility];  Service: Gastroenterology;;   Daryle Eon  09/23/2022   Procedure: Daryle Eon;  Surgeon: Umberto Ganong, Bearl Limes, MD;  Location: [facility];  Service: Gastroenterology;;    Family History: Family History  Problem Relation Age of Onset   Colon cancer Mother     Social History: Social History   Tobacco Use  Smoking Status Never   Passive exposure: Past  Smokeless Tobacco Never   Social History   Substance and Sexual Activity  Alcohol Use Not Currently   Comment: occ   Social History   Substance and Sexual Activity  Drug Use Not Currently    Allergies: No Known Allergies  Medications: Current Outpatient Medications  Medication Sig Dispense Refill   Cholecalciferol  (VITAMIN D ) 125 MCG (5000 UT) CAPS Take 10,000 Units by mouth daily.     Cyanocobalamin  (VITAMIN B-12) 1000 MCG SUBL Place 2 tablets (2,000 mcg total) under the tongue daily. 180 tablet 1   ketoconazole (NIZORAL) 2 % cream Apply 1 Application topically daily as needed for irritation.     vedolizumab  (ENTYVIO ) 300 MG injection Inject 300 mg into the vein every 28 (twenty-eight) days.     folic acid  (FOLVITE ) 1 MG tablet TAKE ONE (1) TABLET BY MOUTH EVERY DAY (Patient not taking: Reported on 01/08/2024) 90 tablet 1   No current facility-administered medications for this visit.    Review of Systems: GENERAL: negative for malaise, night sweats HEENT: No changes in hearing or vision, no nose bleeds or other nasal problems. NECK: Negative for lumps, goiter, pain and significant neck swelling RESPIRATORY: Negative for cough, wheezing CARDIOVASCULAR: Negative for chest pain, leg swelling,  palpitations, orthopnea GI: SEE HPI MUSCULOSKELETAL: Negative for joint pain or swelling, back pain, and muscle pain. SKIN: Negative for lesions, rash PSYCH: Negative for sleep disturbance, mood disorder and recent psychosocial stressors. HEMATOLOGY Negative for prolonged bleeding, bruising easily, and swollen nodes. ENDOCRINE: Negative for cold or heat intolerance, polyuria, polydipsia and goiter. NEURO: negative for tremor, gait imbalance, syncope and seizures. The remainder of the review of systems is noncontributory.   Physical Exam: BP 133/77 (BP Location: Left Arm, Patient Position: Sitting, Cuff Size: Large)   Pulse 81   Temp (!) 97.1 F (36.2 C) (Temporal)   Ht 6\' 3"  (1.905 m)   Wt (!) 317 lb 9.6 oz (144.1 kg)   BMI 39.70 kg/m  GENERAL: The patient is AO x3, in no acute distress.  Obese HEENT: Head is normocephalic and atraumatic. EOMI are intact. Mouth is well hydrated and without lesions. NECK: Supple. No masses LUNGS: Clear to auscultation. No presence of rhonchi/wheezing/rales. Adequate chest expansion HEART: RRR, normal s1 and s2. ABDOMEN: Soft, nontender, no guarding, no peritoneal signs, and nondistended. BS +. No masses. EXTREMITIES: Without any cyanosis, clubbing, rash, lesions or edema. NEUROLOGIC: AOx3, no focal motor deficit. SKIN: no jaundice, no rashes  Imaging/Labs: as above  I personally reviewed and interpreted the available labs, imaging and endoscopic files.  Impression and Plan: KORIE TETRICK is a 68 y.o. 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   Regarding the patient's history of ulcerative colitis, this has been managed with Entyvio  every 4 weeks which has led to some improvement of his symptoms as he  is having better consistency in his bowel movements recently.  However, most recent fecal calprotectin came back very elevated and he had a colonoscopy late in 2024 that was showing presence of ongoing active inflammation.  Given the above abnormalities in his stool testing and his history of PSC-UC with invisible low-grade dysplasia in random biopsies, we will need to proceed with a repeat colonoscopy with random biopsies.  This will also help evaluate the acuity of his disease.  I discussed with him that if there is presence of significant ongoing inflammation, we will need to consider switching his therapy to our agents such as infliximab.  Given his inherent increased risk of colorectal cancer, will need to continue close surveillance with colonoscopies at least every 6 months.  He would like to continue doing his colonoscopies locally as the gastroenterologist he is also seeing at [facility] is about to retire.  We will obtain repeat CBC, CMP and CRP at the time of his colonoscopy.  Regarding his PSC, he has not presented any signs of recurrent biliary obstruction.  He is currently following with Dr. Teofilo Fellers at [facility] and is supposed to have a repeat FibroScan late in May, which I encouraged him to proceed with.   -Schedule colonoscopy -If presence of ongoing inflammation, will favor switching to infliximab -Continue with Entyvio  every 4 weeks -Will check CBC, CMP and CRP at time of colonoscopy  All questions were answered.      Alex Cress, MD Gastroenterology and Hepatology [facility]

## 2024-01-18 ENCOUNTER — Telehealth (INDEPENDENT_AMBULATORY_CARE_PROVIDER_SITE_OTHER): Payer: Self-pay | Admitting: Gastroenterology

## 2024-01-18 NOTE — Telephone Encounter (Signed)
 Attempted to reach patient to schedule TCS. Mobile number states "I'm sorry your call can not be completed at this time". Home number continued to ring with no answer/no answering machine

## 2024-01-25 DIAGNOSIS — K51819 Other ulcerative colitis with unspecified complications: Secondary | ICD-10-CM | POA: Diagnosis not present

## 2024-01-25 DIAGNOSIS — K8301 Primary sclerosing cholangitis: Secondary | ICD-10-CM | POA: Diagnosis not present

## 2024-01-25 MED ORDER — PEG 3350-KCL-NA BICARB-NACL 420 G PO SOLR: 4000.0000 mL | Freq: Once | ORAL | 0 refills | Status: AC

## 2024-01-25 NOTE — Telephone Encounter (Signed)
 Pt wife contacted and spoke with pt. Pt scheduled. Prep sent to pharmacy. Instructions will be mailed to pt. Prep sent to pharmacy. No pa needed per [facility]

## 2024-01-25 NOTE — Addendum Note (Signed)
 Addended by: Ashlan Dignan on: 01/25/2024 10:37 AM   Modules accepted: Orders

## 2024-02-01 DIAGNOSIS — Z1159 Encounter for screening for other viral diseases: Secondary | ICD-10-CM | POA: Diagnosis not present

## 2024-02-01 DIAGNOSIS — K9089 Other intestinal malabsorption: Secondary | ICD-10-CM | POA: Diagnosis not present

## 2024-02-01 DIAGNOSIS — Z09 Encounter for follow-up examination after completed treatment for conditions other than malignant neoplasm: Secondary | ICD-10-CM | POA: Diagnosis not present

## 2024-02-01 DIAGNOSIS — K8301 Primary sclerosing cholangitis: Secondary | ICD-10-CM | POA: Diagnosis not present

## 2024-02-01 DIAGNOSIS — K51819 Other ulcerative colitis with unspecified complications: Secondary | ICD-10-CM | POA: Diagnosis not present

## 2024-02-02 ENCOUNTER — Telehealth (INDEPENDENT_AMBULATORY_CARE_PROVIDER_SITE_OTHER): Payer: Self-pay | Admitting: Gastroenterology

## 2024-02-02 ENCOUNTER — Encounter: Attending: Gastroenterology

## 2024-02-02 VITALS — BP 126/80 | HR 60 | Temp 98.0°F | Resp 18

## 2024-02-02 DIAGNOSIS — K51019 Ulcerative (chronic) pancolitis with unspecified complications: Secondary | ICD-10-CM | POA: Diagnosis not present

## 2024-02-02 MED ORDER — VEDOLIZUMAB 300 MG IV SOLR
300.0000 mg | Freq: Once | INTRAVENOUS | Status: AC
  Administered 2024-02-02: 300 mg via INTRAVENOUS
  Filled 2024-02-02: qty 5

## 2024-02-02 NOTE — Progress Notes (Signed)
 Diagnosis: Ulcerative pancolitis    Provider:  Samantha Cress MD  Procedure: IV Infusion  IV Type: Peripheral, IV Location: left wrist  Entyvio  (Vedolizumab ), Dose: 300 mg  Infusion Start Time: 0944  Infusion Stop Time: 1025  Post Infusion IV Care: Patient declined observation and Peripheral IV Discontinued  Discharge: Condition: Good, Destination: Home . AVS Declined  Performed by:  Rafe Mackowski G [place]tt, RN

## 2024-02-02 NOTE — Telephone Encounter (Signed)
 Pt came to front desk wanting Dr Sammi Crick to know he had labs done yesterday incase he needed to see those prior to his procedure on June 11th. Pt was given his prep instructions and told his prep was at the pharmacy. [phone]

## 2024-02-02 NOTE — Telephone Encounter (Signed)
 noted

## 2024-02-15 ENCOUNTER — Ambulatory Visit: Payer: Medicare Other | Admitting: Urology

## 2024-02-18 IMAGING — MR MR ABDOMEN WO/W CM MRCP
21 of 24 series · 43 of 48 positions shown · IV contrast (10 ml Gadavist)
Comparison: None.

CLINICAL DATA: Elevated liver enzymes with upper abdominal pain.
Clinical concern for primary sclerosing cholangitis.

EXAM:
MRI ABDOMEN WITHOUT AND WITH CONTRAST (INCLUDING MRCP)
TECHNIQUE: Multiplanar multisequence MR imaging of the abdomen was performed
both before and after the administration of intravenous contrast.
Heavily T2-weighted images of the biliary and pancreatic ducts were
obtained, and three-dimensional MRCP images were rendered by post
processing.
CONTRAST:  10mL GADAVIST GADOBUTROL 1 MMOL/ML IV SOLN

[Series 3: cor haste · coronal · 6.0mm · 1.25mm/px · 1 of 40 slices shown]
[im 1/40]
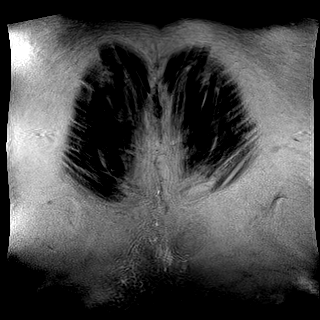

[Series 4: T2 fat-sat · axial · 6.0mm · 1.41mm/px · 1 of 37 slices shown]
[im 1/37]
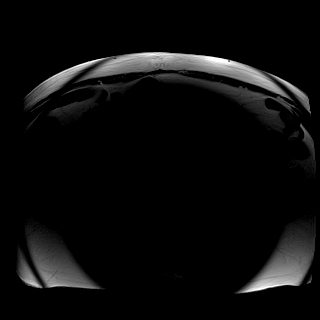

[Series 5: DWI · axial · 6.0mm · 1.68mm/px · 1 of 30 slices shown (1 of 4)]
[im 1/30]
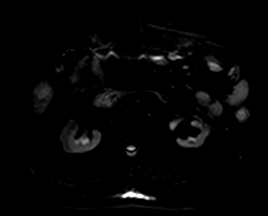

[Series 5: DWI · axial · 6.0mm · 1.68mm/px · 1 of 30 slices shown (2 of 4)]
[im 1/30]
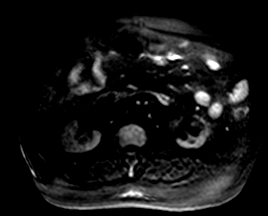

[Series 5: DWI · axial · 6.0mm · 1.68mm/px · 1 of 30 slices shown (3 of 4)]
[im 1/30]
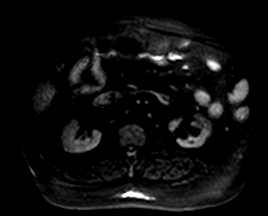

[Series 6: DWI · axial · 6.0mm · 1.68mm/px · 1 of 30 slices shown (4 of 4)]
[im 1/30]
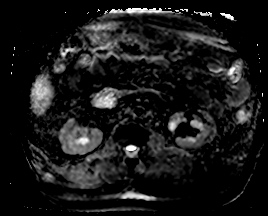

[Series 7: ax in and · axial · 3.0mm · 1.41mm/px · z∈[-181,+56]mm · 2 of 80 slices shown (1 of 2)]
[im 1/80]
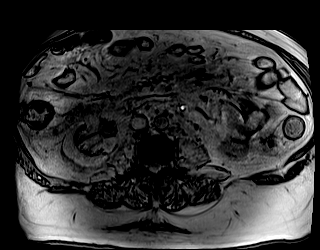
[im 80/80]
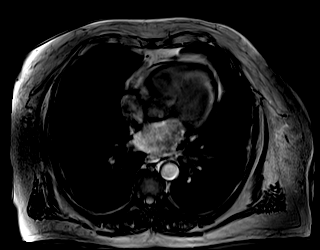

[Series 8: ax in and · axial · 3.0mm · 1.41mm/px · z∈[-181,+56]mm · 2 of 80 slices shown (2 of 2)]
[im 1/80]
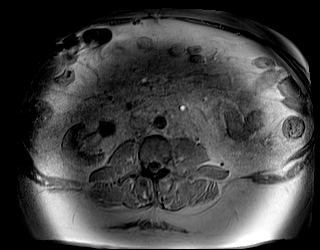
[im 80/80]
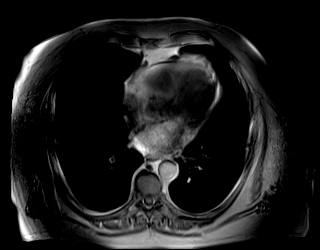

[Series 9: ax haste bh · axial · 6.0mm · 1.41mm/px · 1 of 40 slices shown]
[im 1/40]
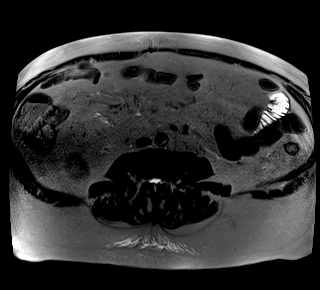

[Series 13: MRCP · coronal · 50.0mm · 0.78mm/px · 1 of 5 slices shown (1 of 2)]
[im 1/5]
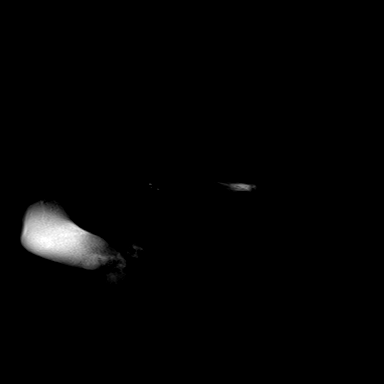

[Series 14: MRCP · coronal · 4.0mm · 1.12mm/px · 1 of 19 slices shown (2 of 2)]
[im 1/19]
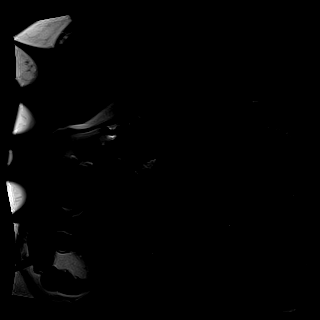

[Series 15: T1 dynamic · axial · 3.0mm · 1.41mm/px · z∈[-181,+56]mm · 3 of 80 slices shown (1 of 7)]
[im 1/80]
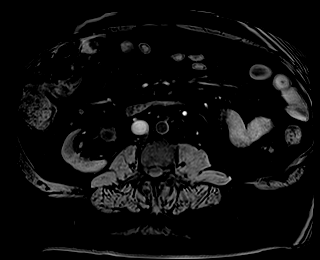
[im 40/80]
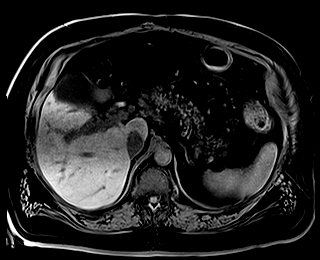
[im 80/80]
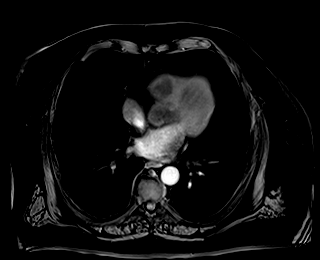

[Series 17: T1 dynamic · axial · 3.0mm · 1.41mm/px · z∈[-181,+56]mm · 3 of 80 slices shown (2 of 7)]
[im 1/80]
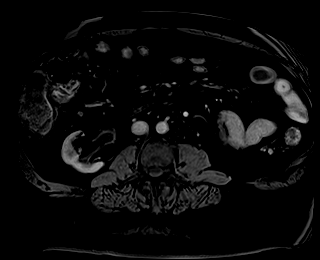
[im 40/80]
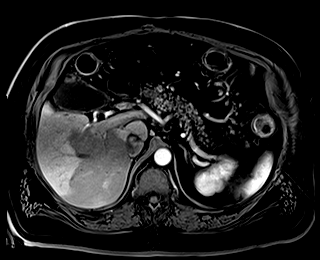
[im 80/80]
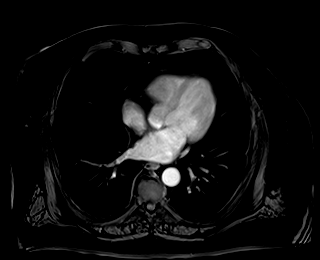

[Series 18: T1 dynamic · axial · 3.0mm · 1.41mm/px · z∈[-181,+56]mm · 3 of 80 slices shown (3 of 7)]
[im 1/80]
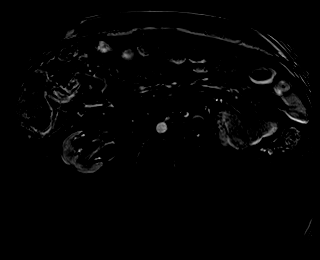
[im 40/80]
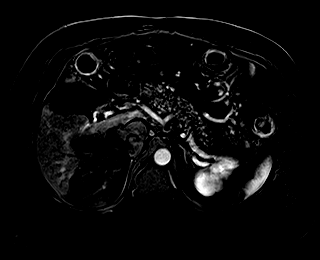
[im 80/80]
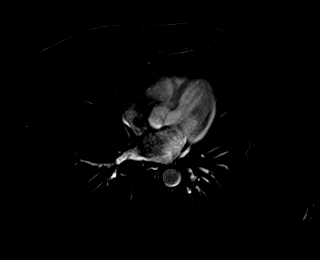

[Series 19: T1 dynamic · axial · 3.0mm · 1.41mm/px · z∈[-181,+56]mm · 3 of 80 slices shown (4 of 7)]
[im 1/80]
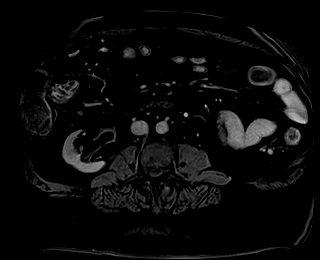
[im 40/80]
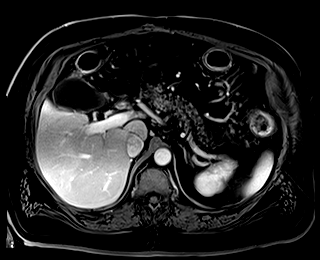
[im 80/80]
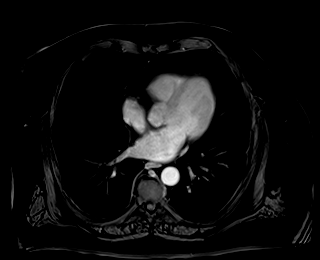

[Series 20: T1 dynamic · axial · 3.0mm · 1.41mm/px · z∈[-181,+56]mm · 3 of 80 slices shown (5 of 7)]
[im 1/80]
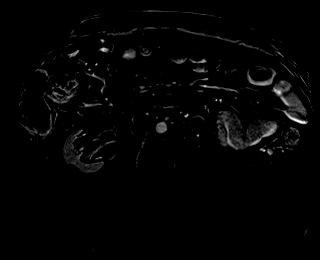
[im 40/80]
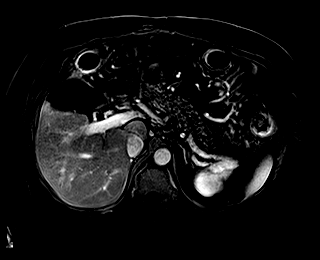
[im 80/80]
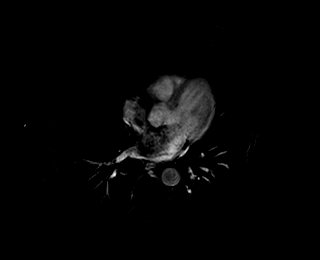

[Series 21: T1 dynamic · axial · 3.0mm · 1.41mm/px · z∈[-181,+56]mm · 3 of 80 slices shown (6 of 7)]
[im 1/80]
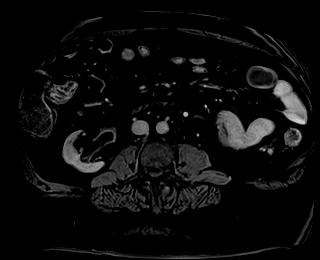
[im 40/80]
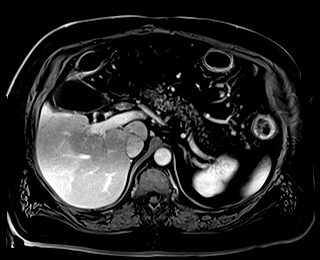
[im 80/80]
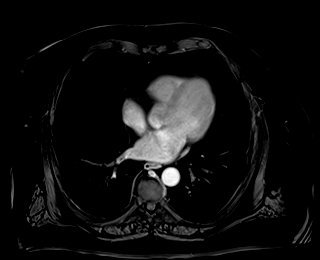

[Series 22: T1 dynamic · axial · 3.0mm · 1.41mm/px · z∈[-181,+56]mm · 3 of 80 slices shown (7 of 7)]
[im 1/80]
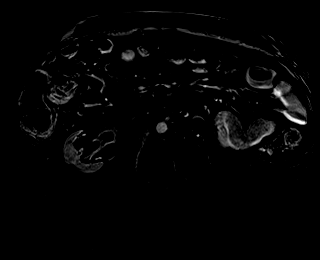
[im 40/80]
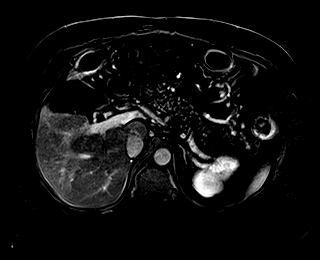
[im 80/80]
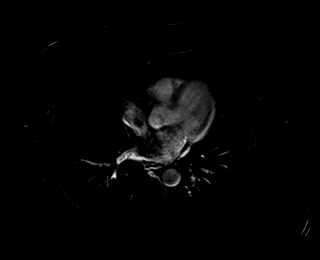

[Series 23: T1 dynamic post-contrast · coronal · 3.0mm · 1.31mm/px · 3 of 72 slices shown (1 of 3)]
[im 1/72]
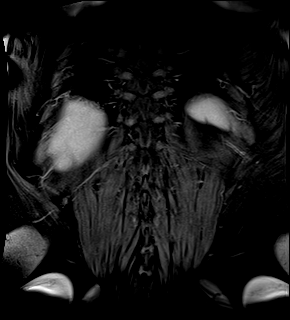
[im 36/72]
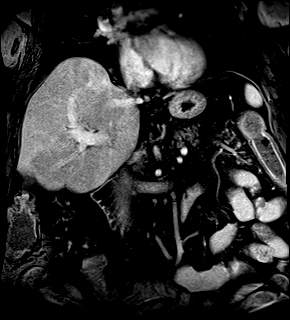
[im 72/72]
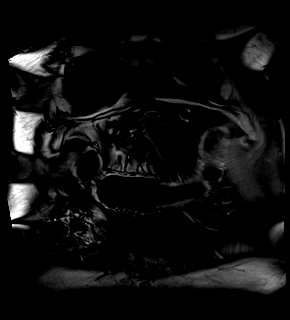

[Series 24: T1 dynamic post-contrast · axial · 3.0mm · 1.41mm/px · z∈[-181,+56]mm · 3 of 80 slices shown (2 of 3)]
[im 1/80]
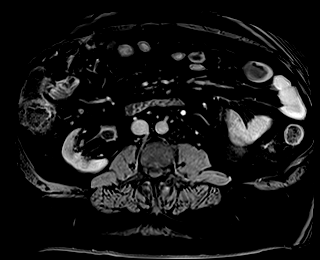
[im 40/80]
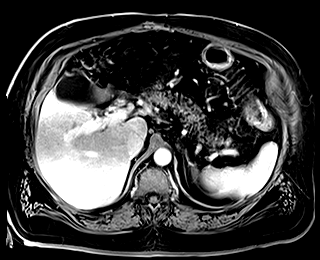
[im 80/80]
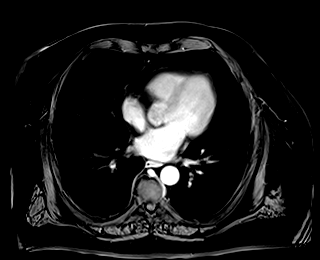

[Series 25: T1 dynamic post-contrast · axial · 3.0mm · 1.41mm/px · z∈[-181,+56]mm · 3 of 80 slices shown (3 of 3)]
[im 1/80]
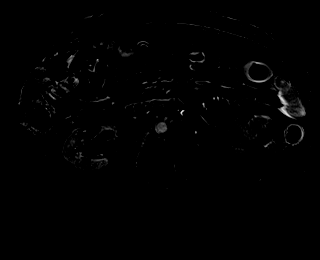
[im 40/80]
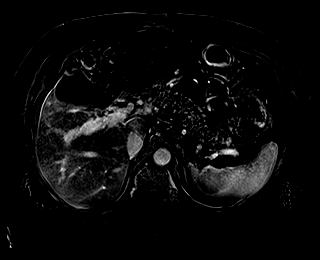
[im 80/80]
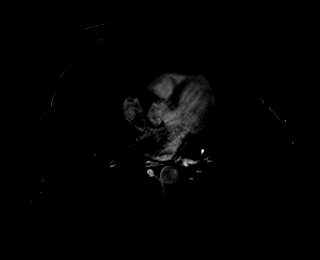

[43 of 48 positions shown; findings below may reference images not displayed]

FINDINGS: Despite efforts by the technologist and patient, mild motion
artifact is present on today's exam and could not be eliminated.
This reduces exam sensitivity and specificity.

Lower chest:  The visualized lower chest appears unremarkable.

Hepatobiliary: Marked volume loss/atrophy within the left hepatic
lobe with mild intrahepatic ductal dilatation limited to the left
lobe. The right hepatic lobe is normal in morphology, without ductal
dilatation or strictures. No significant hepatic steatosis or focal
abnormality identified. There is mildly heterogeneous enhancement on
the early postcontrast images which fades on the delayed
post-contrast images. No focal delayed enhancing lesion or
restricted diffusion identified in the left lobe. Thin section MRCP
images are degraded by motion artifact. There is no significant
extrahepatic biliary dilatation or evidence of choledocholithiasis.
However, the extrahepatic ducts demonstrate diffuse mild wall
thickening and enhancement. There is mild dilatation of the
gallbladder neck or proximal cystic duct. The gallbladder otherwise
appears normal, without evidence of stones.

Pancreas: Diffuse fatty replacement. No pancreatic ductal
dilatation, mass lesion or surrounding inflammation identified.

Spleen: Normal in size without focal abnormality.

Adrenals/Urinary Tract: Both adrenal glands appear normal. Both
kidneys demonstrate mild cortical thinning, pelviectasis and
prominent renal sinus fat. No evidence of enhancing renal mass.

Stomach/Bowel: Probable postsurgical changes in the distal stomach.
The visualized small bowel appears unremarkable. There is
circumferential wall thickening and mild enhancement throughout the
visualized colon, suspicious for colitis. No focal mass lesion or
obstruction identified.

Vascular/Lymphatic: Prominent lymph nodes in the porta hepatis,
likely reactive. No retroperitoneal lymphadenopathy. No acute
vascular findings. The portal, superior mesenteric and splenic veins
are patent. No significant varices.

Other: Susceptibility artifact in the right upper quadrant, likely
related to suspected previous distal gastrectomy. No ascites.

Musculoskeletal: No acute or significant osseous findings. Mild
spondylosis.
IMPRESSION: 1. Marked atrophy within the left hepatic lobe with associated
intrahepatic biliary dilatation. Diffuse wall thickening and
enhancement of the extrahepatic biliary system. Findings could be
secondary to chronic cholangitis or segmental sclerosing
cholangitis. No focal mass lesion or delayed enhancement identified
to strongly suggest cholangiocarcinoma. Consider ERCP for bile duct
brushings.
2. The visualized colon demonstrates wall thickening and enhancement
suspicious for colitis. Is there a history of ulcerative colitis?
3. Postsurgical changes suggesting previous distal gastrectomy.
4. Probable reactive lymph nodes in the porta hepatis.
[DATE] be helpful for further evaluation and/or follow-up given
the motion limitations of this examination.

## 2024-02-21 ENCOUNTER — Ambulatory Visit (HOSPITAL_COMMUNITY): Admitting: Anesthesiology

## 2024-02-21 ENCOUNTER — Encounter (HOSPITAL_COMMUNITY): Payer: Self-pay | Admitting: Gastroenterology

## 2024-02-21 ENCOUNTER — Ambulatory Visit (HOSPITAL_COMMUNITY)
Admission: RE | Admit: 2024-02-21 | Discharge: 2024-02-21 | Disposition: A | Discharge status: Home / Self Care | Attending: Gastroenterology | Admitting: Gastroenterology

## 2024-02-21 ENCOUNTER — Encounter (HOSPITAL_COMMUNITY)
Admission: RE | Disposition: A | Discharge status: Home / Self Care | Payer: Self-pay | Source: Home / Self Care | Attending: Gastroenterology

## 2024-02-21 ENCOUNTER — Other Ambulatory Visit: Payer: Self-pay

## 2024-02-21 DIAGNOSIS — Z8551 Personal history of malignant neoplasm of bladder: Secondary | ICD-10-CM | POA: Diagnosis not present

## 2024-02-21 DIAGNOSIS — K635 Polyp of colon: Secondary | ICD-10-CM | POA: Diagnosis not present

## 2024-02-21 DIAGNOSIS — D122 Benign neoplasm of ascending colon: Secondary | ICD-10-CM

## 2024-02-21 DIAGNOSIS — D120 Benign neoplasm of cecum: Secondary | ICD-10-CM | POA: Diagnosis not present

## 2024-02-21 DIAGNOSIS — K529 Noninfective gastroenteritis and colitis, unspecified: Secondary | ICD-10-CM

## 2024-02-21 DIAGNOSIS — K5140 Inflammatory polyps of colon without complications: Secondary | ICD-10-CM | POA: Insufficient documentation

## 2024-02-21 DIAGNOSIS — D124 Benign neoplasm of descending colon: Secondary | ICD-10-CM | POA: Diagnosis not present

## 2024-02-21 DIAGNOSIS — K5100 Ulcerative (chronic) pancolitis without complications: Secondary | ICD-10-CM

## 2024-02-21 DIAGNOSIS — G4733 Obstructive sleep apnea (adult) (pediatric): Secondary | ICD-10-CM

## 2024-02-21 DIAGNOSIS — G4730 Sleep apnea, unspecified: Secondary | ICD-10-CM | POA: Diagnosis not present

## 2024-02-21 DIAGNOSIS — K5190 Ulcerative colitis, unspecified, without complications: Secondary | ICD-10-CM | POA: Diagnosis not present

## 2024-02-21 HISTORY — PX: COLONOSCOPY: SHX5424

## 2024-02-21 LAB — HM COLONOSCOPY

## 2024-02-21 SURGERY — COLONOSCOPY
Anesthesia: General

## 2024-02-21 MED ORDER — LIDOCAINE 2% (20 MG/ML) 5 ML SYRINGE
INTRAMUSCULAR | Status: DC | PRN
Start: 2024-02-21 — End: 2024-02-21
  Administered 2024-02-21: 60 mg via INTRAVENOUS

## 2024-02-21 MED ORDER — PHENYLEPHRINE 80 MCG/ML (10ML) SYRINGE FOR IV PUSH (FOR BLOOD PRESSURE SUPPORT)
PREFILLED_SYRINGE | INTRAVENOUS | Status: DC | PRN
  Administered 2024-02-21 (×3): 160 ug via INTRAVENOUS

## 2024-02-21 MED ORDER — LACTATED RINGERS IV SOLN: INTRAVENOUS | Status: DC

## 2024-02-21 MED ORDER — PROPOFOL 10 MG/ML IV BOLUS
INTRAVENOUS | Status: DC | PRN
  Administered 2024-02-21: 40 mg via INTRAVENOUS
  Administered 2024-02-21: 150 ug/kg/min via INTRAVENOUS
  Administered 2024-02-21: 40 mg via INTRAVENOUS
  Administered 2024-02-21: 80 mg via INTRAVENOUS

## 2024-02-21 MED ORDER — EPHEDRINE SULFATE-NACL 50-0.9 MG/10ML-% IV SOSY
PREFILLED_SYRINGE | INTRAVENOUS | Status: DC | PRN
Start: 2024-02-21 — End: 2024-02-21
  Administered 2024-02-21 (×2): 10 mg via INTRAVENOUS

## 2024-02-21 NOTE — Anesthesia Preprocedure Evaluation (Signed)
 Anesthesia Evaluation  Patient identified by MRN, date of birth, ID band Patient awake    Reviewed: Allergy & Precautions, H&P , NPO status , Patient's Chart, lab work & pertinent test results, reviewed documented beta blocker date and time   Airway Mallampati: II  TM Distance: >3 FB Neck ROM: full    Dental no notable dental hx. (+) Dental Advisory Given, Teeth Intact   Pulmonary sleep apnea    Pulmonary exam normal breath sounds clear to auscultation       Cardiovascular Exercise Tolerance: Good Normal cardiovascular exam+ dysrhythmias  Rhythm:regular Rate:Normal     Neuro/Psych negative neurological ROS  negative psych ROS   GI/Hepatic negative GI ROS, Neg liver ROS, PUD,,,  Endo/Other  negative endocrine ROS  Class 3 obesity  Renal/GU negative Renal ROS  negative genitourinary   Musculoskeletal  (+) Arthritis , Osteoarthritis,    Abdominal   Peds  Hematology negative hematology ROS (+)   Anesthesia Other Findings   Reproductive/Obstetrics negative OB ROS                             Anesthesia Physical Anesthesia Plan  ASA: 3  Anesthesia Plan: General   Post-op Pain Management: Minimal or no pain anticipated   Induction: Intravenous  PONV Risk Score and Plan: Propofol  infusion  Airway Management Planned: Natural Airway and Nasal Cannula  Additional Equipment: None  Intra-op Plan:   Post-operative Plan:   Informed Consent: I have reviewed the patients History and Physical, chart, labs and discussed the procedure including the risks, benefits and alternatives for the proposed anesthesia with the patient or authorized representative who has indicated his/her understanding and acceptance.     Dental Advisory Given  Plan Discussed with: CRNA  Anesthesia Plan Comments:         Anesthesia Quick Evaluation

## 2024-02-21 NOTE — Discharge Instructions (Signed)
 You are being discharged to home.  Resume your previous diet.  We are waiting for your pathology results.  Repeat colonoscopy timing will depend on biopsy results. Continue Entyvio  every 4 weeks for now. Repeat colonoscopy timing will depend on biopsy results.

## 2024-02-21 NOTE — Anesthesia Postprocedure Evaluation (Signed)
 Anesthesia Post Note  Patient: Alex Thomas  Procedure(s) Performed: COLONOSCOPY  Patient location during evaluation: Endoscopy Anesthesia Type: General Level of consciousness: awake and alert Pain management: pain level controlled Vital Signs Assessment: post-procedure vital signs reviewed and stable Respiratory status: spontaneous breathing, nonlabored ventilation and respiratory function stable Cardiovascular status: blood pressure returned to baseline and stable Postop Assessment: no apparent nausea or vomiting Anesthetic complications: no   There were no known notable events for this encounter.   Last Vitals:  Vitals:   02/21/24 0921 02/21/24 1057  BP: 125/88 115/72  Pulse: 88 92  Resp: 11 17  Temp: 36.7 C 36.8 C  SpO2: 97% 98%    Last Pain:  Vitals:   02/21/24 1057  TempSrc: Oral  PainSc: 0-No pain                 Jezlyn Westerfield L Stella Encarnacion

## 2024-02-21 NOTE — Transfer of Care (Signed)
 Immediate Anesthesia Transfer of Care Note  Patient: Alex Thomas  Procedure(s) Performed: COLONOSCOPY  Patient Location: Endoscopy Unit  Anesthesia Type:General  Level of Consciousness: drowsy and patient cooperative  Airway & Oxygen Therapy: Patient Spontanous Breathing and Patient connected to face mask oxygen  Post-op Assessment: Report given to RN and Post -op Vital signs reviewed and stable  Post vital signs: Reviewed and stable  Last Vitals:  Vitals Value Taken Time  BP    Temp    Pulse    Resp    SpO2      Last Pain:  Vitals:   02/21/24 1012  TempSrc:   PainSc: 0-No pain      Patients Stated Pain Goal: 7 (02/21/24 0914)  Complications: No notable events documented.

## 2024-02-21 NOTE — H&P (Signed)
 Alex Thomas is an 68 y.o. male.   Chief Complaint: ulcerative colitis and low grade dysplasia.  HPI: Alex Thomas is a 68 y.o. 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   The patient denies having any nausea, vomiting, fever, chills, hematochezia, melena, hematemesis, abdominal distention, abdominal pain, diarrhea, jaundice, pruritus or weight loss. States he has 1-2 Bms per day.  Past Medical History:  Diagnosis Date   Arthritis    Bladder cancer [facility])    age 48   Dysrhythmia    History of kidney stones    Multiple gastric ulcers    Pneumonia    when young    Past Surgical History:  Procedure Laterality Date   APPENDECTOMY     BIOPSY  11/23/2021   Procedure: BIOPSY;  Surgeon: Urban Garden, MD;  Location: [facility];  Service: Gastroenterology;;   BIOPSY  09/23/2022   Procedure: BIOPSY;  Surgeon: Urban Garden, MD;  Location: [facility];  Service: Gastroenterology;;   CHOLECYSTECTOMY     COLONOSCOPY WITH PROPOFOL  N/A 11/23/2021   Procedure: COLONOSCOPY WITH PROPOFOL ;  Surgeon: Urban Garden, MD;  Location: [facility];  Service: Gastroenterology;  Laterality: N/A;  805 ASA 1   COLONOSCOPY WITH PROPOFOL  N/A 09/23/2022   Procedure: COLONOSCOPY WITH PROPOFOL ;  Surgeon: Urban Garden, MD;  Location: [facility];  Service: Gastroenterology;  Laterality: N/A;  1:00pm, asa 2, pt knows to arrive at 10:00   CYSTOSCOPY/URETEROSCOPY/HOLMIUM LASER/STENT PLACEMENT Bilateral 07/25/2023   Procedure: CYSTOSCOPY BILATERAL URETEROSCOPY/RETROGRADE PYELOGRAM/HOLMIUM LASER/STENT PLACEMENT;  Surgeon: Homero Luster, MD;  Location: [facility];  Service: Urology;  Laterality: Bilateral;  2 HRS FOR CASE    GASTRECTOMY     HERNIA REPAIR     KIDNEY STONE SURGERY     PERCUTANEOUS NEPHROLITHOTRIPSY     POLYPECTOMY  11/23/2021   Procedure: POLYPECTOMY;  Surgeon: Urban Garden, MD;  Location: [facility];  Service: Gastroenterology;;   Alex Thomas  09/23/2022   Procedure: Alex Thomas;  Surgeon: Urban Garden, MD;  Location: [facility];  Service: Gastroenterology;;    Family History  Problem Relation Age of Onset   Colon cancer Mother    Social History:  reports that he has never smoked. He has been exposed to tobacco smoke. He has never used smokeless tobacco. He reports that he does not currently use alcohol. He reports that he does not currently use drugs.  Allergies: No Known Allergies  Medications Prior to Admission  Medication Sig Dispense Refill   Cholecalciferol  (VITAMIN D ) 125 MCG (5000 UT) CAPS Take 10,000 Units by mouth daily.     Cyanocobalamin  (VITAMIN B-12) 1000 MCG SUBL Place 2 tablets (2,000 mcg total) under the tongue daily. 180 tablet 1   folic acid  (FOLVITE ) 1 MG tablet TAKE ONE (1) TABLET BY MOUTH EVERY DAY 90 tablet 1   vedolizumab  (ENTYVIO ) 300 MG injection Inject 300 mg into the vein every 28 (twenty-eight) days.     ketoconazole (NIZORAL) 2 % cream Apply 1 Application topically daily as needed for irritation.      No results found for this or any previous visit (from the past 48 hours). No results found.  Review of Systems  All other systems reviewed and are negative.   Blood pressure 125/88, pulse 88, temperature  98 F (36.7 C), temperature source Oral, resp. rate 11, height 6' 3 (1.905 m), weight (!) 138.3 kg, SpO2 97%. Physical Exam  GENERAL: The patient is AO x3, in no acute distress. HEENT: Head is normocephalic and atraumatic. EOMI are intact. Mouth is well hydrated and without lesions. NECK: Supple. No masses LUNGS: Clear to auscultation. No presence of rhonchi/wheezing/rales. Adequate chest expansion HEART: RRR, normal  s1 and s2. ABDOMEN: Soft, nontender, no guarding, no peritoneal signs, and nondistended. BS +. No masses. EXTREMITIES: Without any cyanosis, clubbing, rash, lesions or edema. NEUROLOGIC: AOx3, no focal motor deficit. SKIN: no jaundice, no rashes  Assessment/Plan Alex Thomas is a 68 y.o. 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  Proceed with colonoscopy with random colonic biopsies.  Urban Garden, MD 02/21/2024, 9:30 AM

## 2024-02-21 NOTE — Op Note (Signed)
 Alex Thomas - [facility] Patient Name: Alex Thomas Procedure Date: 02/21/2024 10:07 AM MRN: [id number] Date of Birth: [birth year] Attending MD: Samantha Cress , , [id number] CSN: [id number] Age: 68 Admit Type: Outpatient Procedure:                Colonoscopy Indications:              Assess therapeutic response to therapy of chronic                            ulcerative pancolitis-PSC, History of low grade                            dysplasia Providers:                Samantha Cress, Willena Harp, Ruthine Cowper Technician, Technician Referring MD:              Medicines:                Monitored Anesthesia Care Complications:            No immediate complications. Estimated Blood Loss:     Estimated blood loss: none. Procedure:                Pre-Anesthesia Assessment:                           - Prior to the procedure, a History and Physical                            was performed, and patient medications, allergies                            and sensitivities were reviewed. The patient's                            tolerance of previous anesthesia was reviewed.                           - The risks and benefits of the procedure and the                            sedation options and risks were discussed with the                            patient. All questions were answered and informed                            consent was obtained.                           - ASA Grade Assessment: III - A patient with severe                            systemic disease.  After obtaining informed consent, the colonoscope                            was passed under direct vision. Throughout the                            procedure, the patient's blood pressure, pulse, and                            oxygen saturations were monitored continuously. The                            [id number]-HQ190L ([id number]) scope was introduced through                             the anus and advanced to the the terminal ileum.                            The colonoscopy was performed without difficulty.                            The patient tolerated the procedure well. The                            quality of the bowel preparation was excellent. Scope In: 10:20:00 AM Scope Out: 10:50:44 AM Scope Withdrawal Time: 0 hours 26 minutes 14 seconds  Total Procedure Duration: 0 hours 30 minutes 44 seconds  Findings:      The perianal and digital rectal examinations were normal.      Inflammation was found in a continuous and circumferential pattern from       the transverse colon (approximatelyy 50 cm from anal verge) to the       cecum. This was graded as Mayo Score 1 (mild, with erythema, decreased       vascular pattern, mild friability), and when compared to the previous       examination, the findings are unchanged. There was a focal area of       nodularity at 50 cm - possibly the area where LGD has been detected x2.       There was also presence of patchy inflammation at 40 cm. From anal verge       to 40 cm, there was no inflammation. Imaging was performed using white       light and narrow band imaging to visualize the mucosa. Biopsies were       taken every 10 cm with a cold forceps for histology.      A 12 mm polyp was found in the ascending colon. The polyp was       semi-sessile. The polyp was removed with a cold snare. Resection and       retrieval were complete.      A 1 mm polyp was found in the cecum. The polyp was sessile. The polyp       was removed with a cold biopsy forceps. Resection and retrieval were       complete.      The retroflexed view of the distal rectum and anal verge was normal and  showed no anal or rectal abnormalities. Impression:               - Mild (Mayo Score 1) ulcerative colitis, unchanged                            since the last examination. Biopsied.                           - One 12 mm polyp in the  ascending colon, removed                            with a cold snare. Resected and retrieved.                           - One 1 mm polyp in the cecum, removed with a cold                            biopsy forceps. Resected and retrieved.                           - The distal rectum and anal verge are normal on                            retroflexion view. Moderate Sedation:      Per Anesthesia Care Recommendation:           - Discharge patient to home (ambulatory).                           - Resume previous diet.                           - Await pathology results. .                           - Continue Entyvio  every 4 weeks for now.                           - Repeat colonoscopy timing will depend on biopsy                            results. Procedure Code(s):        --- Professional ---                           [phone], Colonoscopy, flexible; with removal of                            tumor(s), polyp(s), or other lesion(s) by snare                            technique                           45380, 59, Colonoscopy, flexible; with biopsy,  single or multiple Diagnosis Code(s):        --- Professional ---                           D12.2, Benign neoplasm of ascending colon                           D12.0, Benign neoplasm of cecum                           K51.00, Ulcerative (chronic) pancolitis without                            complications CPT copyright 2022 American Medical Association. All rights reserved. The codes documented in this report are preliminary and upon coder review may  be revised to meet current compliance requirements. Samantha Cress, MD Samantha Cress,  02/21/2024 11:07:54 AM This report has been signed electronically. Number of Addenda: 0

## 2024-02-22 ENCOUNTER — Encounter (INDEPENDENT_AMBULATORY_CARE_PROVIDER_SITE_OTHER): Payer: Self-pay | Admitting: *Deleted

## 2024-02-22 ENCOUNTER — Encounter (HOSPITAL_COMMUNITY): Payer: Self-pay | Admitting: Gastroenterology

## 2024-02-22 LAB — SURGICAL PATHOLOGY

## 2024-02-23 ENCOUNTER — Ambulatory Visit (INDEPENDENT_AMBULATORY_CARE_PROVIDER_SITE_OTHER): Payer: Self-pay | Admitting: Gastroenterology

## 2024-02-26 ENCOUNTER — Telehealth (INDEPENDENT_AMBULATORY_CARE_PROVIDER_SITE_OTHER): Payer: Self-pay

## 2024-02-26 NOTE — Telephone Encounter (Signed)
 I spoke with the patient and made him aware of the pathology report stating the following.  What does this mean? The pathology showed there was presence of mild inflammation in the right side of your colon extending up to the descending colon.  Notably, there was presence of the same precancerous abnormality found in the past (low-grade dysplasia) around 60 cm.  We will need to keep monitoring this area closely as there is potential risk of this progressing to cancer in the future.  Due to this I will recommend a repeat colonoscopy in 6 months.   Once you discuss with Alex Thomas the findings, please reach us  out as we may need to discuss changing you to another medication.    Please call us  at [phone] if you have persistent problems or have questions about your condition that have not been fully answered at this time.   Sincerely,   Alex Cress, MD Gastroenterology and Hepatology  Patient says Alex Thomas is retiring next month, he is aggreable to have repeat in six months as requested,but does not see the use of staying on Entyvio  if this is not helping. He would like to discuss other options. [phone]. Please advise.

## 2024-02-29 ENCOUNTER — Other Ambulatory Visit (INDEPENDENT_AMBULATORY_CARE_PROVIDER_SITE_OTHER): Payer: Self-pay | Admitting: Gastroenterology

## 2024-02-29 NOTE — Telephone Encounter (Signed)
 Spoke with the patient today regarding the findings of most recent colonoscopy.  He has some subtle nodularity in his colon where he has been found to have low-grade dysplasia multiple times.  I advised him to follow at [facility] as prior and discussed with them if there was an endoscopic way to potentially remove the area of low-grade dysplasia. As he is still having some mild ongoing inflammation in his colon, we have decided to switch his medication for infliximab.  Keyana and Steve, I will cancel his infusion tomorrow.  I will start him on infliximab 5 mg/kg induction and maintenance every 8 weeks.  I will place order for this today.  Thanks

## 2024-02-29 NOTE — Telephone Encounter (Signed)
 Patient's insurance prefers Avsola, would you like to switch?

## 2024-02-29 NOTE — Telephone Encounter (Signed)
 I also forgot to mention, we will need to perform the PredictrPK testing for this patient. He will need to have the blood drawn for this test on the day he is getting the third dose of his infliximab (this should be drawn before starting the infusion).  Please let me know if I have to sign the requisition order. Thanks

## 2024-03-01 ENCOUNTER — Other Ambulatory Visit (INDEPENDENT_AMBULATORY_CARE_PROVIDER_SITE_OTHER): Payer: Self-pay | Admitting: Gastroenterology

## 2024-03-01 ENCOUNTER — Ambulatory Visit

## 2024-03-01 NOTE — Telephone Encounter (Signed)
 Actually I discontinued the Entyvio  and Remicade order, the only one now is Avsola Thanks

## 2024-03-01 NOTE — Telephone Encounter (Signed)
 No preference, will switch to Avsola 5 mg/kg every 8 weeks after induction. Please discontinue the Remicade order.  Thanks

## 2024-03-04 ENCOUNTER — Telehealth: Payer: Self-pay

## 2024-03-04 ENCOUNTER — Ambulatory Visit

## 2024-03-04 NOTE — Telephone Encounter (Signed)
 Hello,  Patient will be scheduled as soon as possible.  Auth Submission: NO AUTH NEEDED Site of care: Site of care: [facility] Payer: [employer] Medication & CPT/J Code(s) submitted: Avsola (infliximab-axxq) [id number] Diagnosis Code:  Route of submission (phone, fax, portal): portal Phone # Fax # Auth type: Buy/Bill PB Units/visits requested: 5mg /kg, q2, 4, 8 weeks Reference number: [id number] Approval from: 03/04/24 to 09/11/24

## 2024-03-04 NOTE — Telephone Encounter (Signed)
 Thanks

## 2024-03-06 ENCOUNTER — Encounter: Attending: Gastroenterology

## 2024-03-06 VITALS — BP 119/80 | HR 60 | Temp 97.8°F | Resp 18

## 2024-03-06 DIAGNOSIS — K51019 Ulcerative (chronic) pancolitis with unspecified complications: Secondary | ICD-10-CM | POA: Diagnosis not present

## 2024-03-06 MED ORDER — SODIUM CHLORIDE 0.9 % IV SOLN
5.0000 mg/kg | Freq: Once | INTRAVENOUS | Status: AC
  Administered 2024-03-06: 700 mg via INTRAVENOUS
  Filled 2024-03-06: qty 70

## 2024-03-06 MED ORDER — DIPHENHYDRAMINE HCL 25 MG PO CAPS
25.0000 mg | ORAL_CAPSULE | Freq: Once | ORAL | Status: AC
  Administered 2024-03-06: 25 mg via ORAL

## 2024-03-06 MED ORDER — ACETAMINOPHEN 325 MG PO TABS
650.0000 mg | ORAL_TABLET | Freq: Once | ORAL | Status: AC
  Administered 2024-03-06: 650 mg via ORAL

## 2024-03-06 MED ORDER — METHYLPREDNISOLONE SODIUM SUCC 40 MG IJ SOLR
40.0000 mg | Freq: Once | INTRAMUSCULAR | Status: AC
  Administered 2024-03-06: 40 mg via INTRAVENOUS

## 2024-03-06 NOTE — Progress Notes (Signed)
 Diagnosis: Ulcerative Colitis  Provider:  Eartha Sieving MD  Procedure: IV Infusion  IV Type: Peripheral, IV Location: left wrist  Avsola (infliximab-axxq), Dose: 700 mg  Infusion Start Time: 1040  Infusion Stop Time: 1248  Post Infusion IV Care: Observation period completed  Discharge: Condition: Good, Destination: Home . AVS Provided  Performed by:  Baldwin Darice Helling, RN

## 2024-03-07 NOTE — Telephone Encounter (Signed)
 I spoke with the patient he says he had his first Avsola infusion done at [facility] infusion clinic on 03/06/2024.

## 2024-03-07 NOTE — Progress Notes (Signed)
 6 mth TCS noted in recall Patient result letter mailed procedure note and pathology result faxed to PCP

## 2024-03-20 ENCOUNTER — Encounter: Attending: Gastroenterology | Admitting: Emergency Medicine

## 2024-03-20 VITALS — BP 134/87 | HR 59 | Temp 97.3°F | Resp 16 | Wt 302.0 lb

## 2024-03-20 DIAGNOSIS — K51019 Ulcerative (chronic) pancolitis with unspecified complications: Secondary | ICD-10-CM | POA: Diagnosis not present

## 2024-03-20 MED ORDER — ACETAMINOPHEN 325 MG PO TABS
650.0000 mg | ORAL_TABLET | Freq: Once | ORAL | Status: AC
Start: 2024-03-20 — End: 2024-03-20
  Administered 2024-03-20: 650 mg via ORAL

## 2024-03-20 MED ORDER — METHYLPREDNISOLONE SODIUM SUCC 40 MG IJ SOLR
40.0000 mg | Freq: Once | INTRAMUSCULAR | Status: AC
  Administered 2024-03-20: 40 mg via INTRAVENOUS

## 2024-03-20 MED ORDER — SODIUM CHLORIDE 0.9 % IV SOLN
5.0000 mg/kg | Freq: Once | INTRAVENOUS | Status: AC
  Administered 2024-03-20: 700 mg via INTRAVENOUS
  Filled 2024-03-20: qty 70

## 2024-03-20 MED ORDER — DIPHENHYDRAMINE HCL 25 MG PO CAPS
25.0000 mg | ORAL_CAPSULE | Freq: Once | ORAL | Status: AC
  Administered 2024-03-20: 25 mg via ORAL

## 2024-03-20 NOTE — Progress Notes (Signed)
 Diagnosis: Ulcerative Colitis  Provider:  Eartha Sieving MD  Procedure: IV Infusion  IV Type: Peripheral, IV Location: L Hand  Avsola  (infliximab -axxq), Dose: 700 mg  Infusion Start Time: 1030  Infusion Stop Time: 1227  Post Infusion IV Care: Peripheral IV Discontinued  Discharge: Condition: Good, Destination: Home . AVS Declined  Performed by:  Delon ONEIDA Officer, RN

## 2024-03-22 DIAGNOSIS — K8301 Primary sclerosing cholangitis: Secondary | ICD-10-CM | POA: Diagnosis not present

## 2024-03-22 DIAGNOSIS — R978 Other abnormal tumor markers: Secondary | ICD-10-CM | POA: Diagnosis not present

## 2024-03-22 DIAGNOSIS — R799 Abnormal finding of blood chemistry, unspecified: Secondary | ICD-10-CM | POA: Diagnosis not present

## 2024-03-22 DIAGNOSIS — K760 Fatty (change of) liver, not elsewhere classified: Secondary | ICD-10-CM | POA: Diagnosis not present

## 2024-03-22 DIAGNOSIS — R932 Abnormal findings on diagnostic imaging of liver and biliary tract: Secondary | ICD-10-CM | POA: Diagnosis not present

## 2024-03-22 DIAGNOSIS — R946 Abnormal results of thyroid function studies: Secondary | ICD-10-CM | POA: Diagnosis not present

## 2024-03-25 ENCOUNTER — Telehealth (INDEPENDENT_AMBULATORY_CARE_PROVIDER_SITE_OTHER): Payer: Self-pay

## 2024-03-25 NOTE — Telephone Encounter (Signed)
 I tried reaching patient at both numbers listed and no answer.  I received a form from [employer] stating this patient had an adverse event while on Entyvio . Patient no longer on Entyvio . Currently on Avsola .

## 2024-03-25 NOTE — Telephone Encounter (Signed)
 Thanks

## 2024-03-25 NOTE — Telephone Encounter (Signed)
 I spoke with the patient and inquired if he had any type of Adverse event while taking Entyvio . Patient states he DID NOT have any Adverse Event while taking Entyvio . Form placed on your desk.

## 2024-04-17 ENCOUNTER — Encounter: Attending: Gastroenterology | Admitting: Emergency Medicine

## 2024-04-17 VITALS — BP 120/82 | HR 58 | Temp 97.4°F | Resp 16 | Wt 302.0 lb

## 2024-04-17 DIAGNOSIS — K51019 Ulcerative (chronic) pancolitis with unspecified complications: Secondary | ICD-10-CM | POA: Diagnosis not present

## 2024-04-17 MED ORDER — DIPHENHYDRAMINE HCL 25 MG PO CAPS
25.0000 mg | ORAL_CAPSULE | Freq: Once | ORAL | Status: AC
  Administered 2024-04-17: 25 mg via ORAL

## 2024-04-17 MED ORDER — ACETAMINOPHEN 325 MG PO TABS
650.0000 mg | ORAL_TABLET | Freq: Once | ORAL | Status: AC
  Administered 2024-04-17: 650 mg via ORAL

## 2024-04-17 MED ORDER — METHYLPREDNISOLONE SODIUM SUCC 40 MG IJ SOLR
40.0000 mg | Freq: Once | INTRAMUSCULAR | Status: AC
  Administered 2024-04-17: 40 mg via INTRAVENOUS

## 2024-04-17 MED ORDER — SODIUM CHLORIDE 0.9 % IV SOLN
5.0000 mg/kg | Freq: Once | INTRAVENOUS | Status: AC
  Administered 2024-04-17: 700 mg via INTRAVENOUS
  Filled 2024-04-17: qty 70

## 2024-04-17 NOTE — Progress Notes (Signed)
 Diagnosis: Ulcerative Colitis  Provider:  Eartha Sieving MD  Procedure: IV Infusion  IV Type: Peripheral, IV Location: R Hand  Avsola  (infliximab -axxq), Dose: 700 mg  Infusion Start Time: 1035  Infusion Stop Time: 1248  Post Infusion IV Care: Peripheral IV Discontinued  Discharge: Condition: Good, Destination: Home . AVS Declined  Performed by:  Delon ONEIDA Officer, RN

## 2024-04-23 DIAGNOSIS — E559 Vitamin D deficiency, unspecified: Secondary | ICD-10-CM | POA: Diagnosis not present

## 2024-04-23 DIAGNOSIS — Z0000 Encounter for general adult medical examination without abnormal findings: Secondary | ICD-10-CM | POA: Diagnosis not present

## 2024-04-29 DIAGNOSIS — R748 Abnormal levels of other serum enzymes: Secondary | ICD-10-CM | POA: Diagnosis not present

## 2024-04-29 DIAGNOSIS — E785 Hyperlipidemia, unspecified: Secondary | ICD-10-CM | POA: Diagnosis not present

## 2024-04-29 DIAGNOSIS — E871 Hypo-osmolality and hyponatremia: Secondary | ICD-10-CM | POA: Diagnosis not present

## 2024-04-29 DIAGNOSIS — Z87442 Personal history of urinary calculi: Secondary | ICD-10-CM | POA: Diagnosis not present

## 2024-04-29 DIAGNOSIS — K5090 Crohn's disease, unspecified, without complications: Secondary | ICD-10-CM | POA: Diagnosis not present

## 2024-04-29 DIAGNOSIS — I480 Paroxysmal atrial fibrillation: Secondary | ICD-10-CM | POA: Diagnosis not present

## 2024-04-29 DIAGNOSIS — R17 Unspecified jaundice: Secondary | ICD-10-CM | POA: Diagnosis not present

## 2024-04-29 DIAGNOSIS — K8301 Primary sclerosing cholangitis: Secondary | ICD-10-CM | POA: Diagnosis not present

## 2024-04-29 DIAGNOSIS — K760 Fatty (change of) liver, not elsewhere classified: Secondary | ICD-10-CM | POA: Diagnosis not present

## 2024-04-29 DIAGNOSIS — D649 Anemia, unspecified: Secondary | ICD-10-CM | POA: Diagnosis not present

## 2024-05-02 DIAGNOSIS — K7400 Hepatic fibrosis, unspecified: Secondary | ICD-10-CM | POA: Diagnosis not present

## 2024-05-02 DIAGNOSIS — K5190 Ulcerative colitis, unspecified, without complications: Secondary | ICD-10-CM | POA: Diagnosis not present

## 2024-05-02 DIAGNOSIS — K8301 Primary sclerosing cholangitis: Secondary | ICD-10-CM | POA: Diagnosis not present

## 2024-05-09 DIAGNOSIS — Z09 Encounter for follow-up examination after completed treatment for conditions other than malignant neoplasm: Secondary | ICD-10-CM | POA: Diagnosis not present

## 2024-05-09 DIAGNOSIS — K8301 Primary sclerosing cholangitis: Secondary | ICD-10-CM | POA: Diagnosis not present

## 2024-05-09 DIAGNOSIS — Z1159 Encounter for screening for other viral diseases: Secondary | ICD-10-CM | POA: Diagnosis not present

## 2024-05-16 ENCOUNTER — Encounter (INDEPENDENT_AMBULATORY_CARE_PROVIDER_SITE_OTHER): Payer: Self-pay | Admitting: Gastroenterology

## 2024-06-12 ENCOUNTER — Encounter: Attending: Gastroenterology | Admitting: Internal Medicine

## 2024-06-12 VITALS — BP 121/83 | HR 61 | Temp 97.3°F | Resp 16 | Wt 302.0 lb

## 2024-06-12 DIAGNOSIS — K51019 Ulcerative (chronic) pancolitis with unspecified complications: Secondary | ICD-10-CM | POA: Insufficient documentation

## 2024-06-12 MED ORDER — ACETAMINOPHEN 325 MG PO TABS
650.0000 mg | ORAL_TABLET | Freq: Once | ORAL | Status: AC
  Administered 2024-06-12: 650 mg via ORAL

## 2024-06-12 MED ORDER — METHYLPREDNISOLONE SODIUM SUCC 40 MG IJ SOLR
40.0000 mg | Freq: Once | INTRAMUSCULAR | Status: AC
  Administered 2024-06-12: 40 mg via INTRAVENOUS

## 2024-06-12 MED ORDER — SODIUM CHLORIDE 0.9 % IV SOLN
5.0000 mg/kg | Freq: Once | INTRAVENOUS | Status: AC
  Administered 2024-06-12: 700 mg via INTRAVENOUS
  Filled 2024-06-12: qty 70

## 2024-06-12 MED ORDER — DIPHENHYDRAMINE HCL 25 MG PO CAPS
25.0000 mg | ORAL_CAPSULE | Freq: Once | ORAL | Status: AC
  Administered 2024-06-12: 25 mg via ORAL

## 2024-06-12 NOTE — Progress Notes (Signed)
 Diagnosis: Ulcerative Colitis  Provider:  Eartha Sieving MD  Procedure: IV Infusion  IV Type: Peripheral, IV Location: Left Wrist  Avsola  (infliximab -axxq), Dose: 700mg   Infusion Start Time: 0940  Infusion Stop Time: 1110  Post Infusion IV Care: Observation period completed  Discharge: Condition: Good, Destination: Home . AVS Provided  Performed by:  Blanca Selinda SAUNDERS, LPN

## 2024-06-26 ENCOUNTER — Encounter (INDEPENDENT_AMBULATORY_CARE_PROVIDER_SITE_OTHER): Payer: Self-pay | Admitting: Gastroenterology

## 2024-07-08 ENCOUNTER — Ambulatory Visit (INDEPENDENT_AMBULATORY_CARE_PROVIDER_SITE_OTHER): Admitting: Gastroenterology

## 2024-07-29 ENCOUNTER — Encounter (INDEPENDENT_AMBULATORY_CARE_PROVIDER_SITE_OTHER): Payer: Self-pay | Admitting: *Deleted

## 2024-07-29 ENCOUNTER — Ambulatory Visit (INDEPENDENT_AMBULATORY_CARE_PROVIDER_SITE_OTHER): Admitting: Gastroenterology

## 2024-07-29 ENCOUNTER — Other Ambulatory Visit (INDEPENDENT_AMBULATORY_CARE_PROVIDER_SITE_OTHER): Payer: Self-pay | Admitting: *Deleted

## 2024-07-29 ENCOUNTER — Encounter (INDEPENDENT_AMBULATORY_CARE_PROVIDER_SITE_OTHER): Payer: Self-pay | Admitting: Gastroenterology

## 2024-07-29 VITALS — BP 115/66 | HR 76 | Temp 97.5°F | Ht 75.0 in | Wt 329.3 lb

## 2024-07-29 DIAGNOSIS — K7460 Unspecified cirrhosis of liver: Secondary | ICD-10-CM | POA: Insufficient documentation

## 2024-07-29 DIAGNOSIS — K8301 Primary sclerosing cholangitis: Secondary | ICD-10-CM

## 2024-07-29 DIAGNOSIS — K760 Fatty (change of) liver, not elsewhere classified: Secondary | ICD-10-CM

## 2024-07-29 DIAGNOSIS — Z79899 Other long term (current) drug therapy: Secondary | ICD-10-CM

## 2024-07-29 DIAGNOSIS — K51019 Ulcerative (chronic) pancolitis with unspecified complications: Secondary | ICD-10-CM

## 2024-07-29 DIAGNOSIS — Z7969 Long term (current) use of other immunomodulators and immunosuppressants: Secondary | ICD-10-CM

## 2024-07-29 DIAGNOSIS — K639 Disease of intestine, unspecified: Secondary | ICD-10-CM | POA: Diagnosis not present

## 2024-07-29 MED ORDER — PEG 3350-KCL-NA BICARB-NACL 420 G PO SOLR: 4000.0000 mL | ORAL | 0 refills | Status: DC

## 2024-07-29 NOTE — Patient Instructions (Addendum)
 Schedule colonoscopy in mid December Perform blood workup a day before your next infusion Continue Remicade  5 mg/kg every 8 weeks Follow up with liver hepatology clinic in January 2026 Try to implement a low carbohydrate diet to lose more weight.

## 2024-07-29 NOTE — Progress Notes (Signed)
 Toribio Fortune, M.D. Gastroenterology & Hepatology [facility] [address] [place], [place] [address]  Primary Care Physician: Shona Norleen PEDLAR, MD [address] [place] [address]  I will communicate my assessment and recommendations to the referring MD via EMR.  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  The patient was last seen on 01/08/2024. At that time, the patient was scheduled for colonoscopy.  Given findings on last colonoscopy on 02/21/2024 along with optimal levels of vedolizumab , the patient was switched to infliximab .  The patient is currently on infliximab  5 mg/kg.  He has received 4 doses, last dose on 06/12/2024.  Patient stats he has felt well with the medication. Stools are more solid, not watery. Has 1-2 Bms a day. No melena or hematochezia. Has felt well and sleeping well. The patient denies having any nausea, vomiting, fever, chills, hematochezia, melena, hematemesis, abdominal distention, abdominal pain, diarrhea, jaundice, pruritus or weight loss.  Most recent blood workup from 03/22/2024 showed CBC with hemoglobin 13.3, WBC 9.8, platelets 303, CMP with alkaline phosphatase 186, albumin 3.7, AST 25, ALT 24, normal electrolytes and renal function, CA 19-9 10, AFP2.6.   last seen by Dr. Tobie for [facility] at  [facility] on 03/22/2024. underwent a FibroScan on same-day that showed a K  PA of 19.6 suggestive of cirrhosis   last imaging, MR RI of the abdomen/MRCP on 8/21/20208/21/2025 which showed changes of macronodular liver with atrophy of the left hemiliver, no dominant stricture.  MR elastography at that point was 4.4.  Last flu shot:2025 Last pneumonia shot:2025 Last evaluation by dermatology: possibly 8 months Last zoster vaccine: 2025 COVID-19 shot: received 2 doses Last hepatitis B testing/hep B testing: 05/01/2023  Last Colonoscopy: 02/21/2024 - Inflammation was found in a continuous and circumferential pattern from the transverse colon ( approximatelyy 50 cm from anal verge) to the cecum. This was graded as Mayo Score 1 ( mild, with erythema, decreased vascular pattern, mild friability) , and when compared to the previous examination, the findings are unchanged. There was a focal area of nodularity at 50 cm - possibly the area where LGD has been detected x2. There was also presence of patchy inflammation at 40 cm. From anal verge to 40 cm, there was no inflammation. Imaging was performed using white light and narrow band imaging to visualize the mucosa. Biopsies were taken every 10 cm with a cold forceps for histology. - A 12 mm polyp was found in the ascending colon. The polyp was semi- sessile. The polyp was removed with a cold snare. Resection and retrieval were complete. - 1 mm polyp was found in the cecum. The polyp was sessile. The polyp was removed with a cold biopsy forceps. Resection and retrieval were complete.  FINAL MICROSCOPIC DIAGNOSIS:  A. CECUM AND ASCENDING COLON, POLYPECTOMY: Benign inflammatory polyps Negative for dysplasia and granulomas  B. COLON @ 90CM, BIOPSY: Chronic active colitis with cryptitis Negative for dysplasia and granulomas  C. COLON @ 80CM, BIOPSY: Chronic active colitis with cryptitis Negative  for dysplasia and granulomas  D. COLON @ 70CM, BIOPSY: Chronic active colitis with cryptitis Negative for dysplasia and  granulomas  E. COLON @ 60CM, BIOPSY: Chronic active colitis showing flat/invisible low-grade dysplasia Negative for high-grade dysplasia and granulomas  F. COLON @ 50CM, BIOPSY: Chronic active colitis with cryptitis Negative for dysplasia and granulomas  G. COLON @ 40CM, BIOPSY: Chronic active colitis with cryptitis Negative for dysplasia and granulomas  H. COLON @ 30CM, BIOPSY: Benign colonic mucosa with no diagnostic abnormality  I. COLON @ 20CM, BIOPSY: Benign colonic mucosa with no diagnostic abnormality  J. COLON @ 10CM, BIOPSY: Benign colonic mucosa with no diagnostic abnormality   Patient was recommended to have repeat colonoscopy in 6 months.  Past Medical History: Past Medical History:  Diagnosis Date   Arthritis    Bladder cancer [facility])    age 50   Dysrhythmia    History of kidney stones    Multiple gastric ulcers    Pneumonia    when young    Past Surgical History: Past Surgical History:  Procedure Laterality Date   APPENDECTOMY     BIOPSY  11/23/2021   Procedure: BIOPSY;  Surgeon: Eartha Angelia Sieving, MD;  [facility];  Service: Gastroenterology;;   BIOPSY  09/23/2022   Procedure: BIOPSY;  Surgeon: Eartha Angelia Sieving, MD;  [facility];  Service: Gastroenterology;;   CHOLECYSTECTOMY     COLONOSCOPY N/A 02/21/2024   Procedure: COLONOSCOPY;  Surgeon: Eartha Angelia Sieving, MD;  [facility];  Service: Gastroenterology;  Laterality: N/A;  10:30AM;ASA 1   COLONOSCOPY WITH PROPOFOL  N/A 11/23/2021   Procedure: COLONOSCOPY WITH PROPOFOL ;  Surgeon: Eartha Angelia Sieving, MD;  [facility];  Service: Gastroenterology;  Laterality: N/A;  805 ASA 1   COLONOSCOPY WITH PROPOFOL  N/A 09/23/2022   Procedure: COLONOSCOPY WITH PROPOFOL ;  Surgeon: Eartha Angelia Sieving, MD;  [facility];  Service: Gastroenterology;  Laterality: N/A;  1:00pm, asa 2, pt knows to arrive at 10:00    CYSTOSCOPY/URETEROSCOPY/HOLMIUM LASER/STENT PLACEMENT Bilateral 07/25/2023   Procedure: CYSTOSCOPY BILATERAL URETEROSCOPY/RETROGRADE PYELOGRAM/HOLMIUM LASER/STENT PLACEMENT;  Surgeon: Watt Rush, MD;  [facility]: [facility];  Service: Urology;  Laterality: Bilateral;  2 HRS FOR CASE   GASTRECTOMY     HERNIA REPAIR     KIDNEY STONE SURGERY     PERCUTANEOUS NEPHROLITHOTRIPSY     POLYPECTOMY  11/23/2021   Procedure: POLYPECTOMY;  Surgeon: Eartha Angelia Sieving, MD;  [facility];  Service: Gastroenterology;;   MATIAS  09/23/2022   Procedure: MATIAS;  Surgeon: Eartha Angelia, Sieving, MD;  [facility];  Service: Gastroenterology;;    Family History: Family History  Problem Relation Age of Onset   Colon cancer Mother     Social History: Social History   Tobacco Use  Smoking Status Never   Passive exposure: Past  Smokeless Tobacco Never   Social History   Substance and Sexual Activity  Alcohol Use Not Currently   Comment: occ   Social History   Substance and Sexual Activity  Drug Use Not Currently    Allergies: No Known Allergies  Medications: Current Outpatient Medications  Medication Sig Dispense Refill   Cholecalciferol  (VITAMIN D ) 125 MCG (5000 UT) CAPS Take 10,000 Units by mouth daily.     Cyanocobalamin  (VITAMIN B-12) 1000 MCG SUBL Place 2 tablets (2,000 mcg total) under the tongue daily. 180 tablet 1   folic acid  (FOLVITE ) 1 MG tablet TAKE ONE (1) TABLET BY MOUTH EVERY DAY  90 tablet 1   inFLIXimab -axxq (AVSOLA  IV) Inject into the vein. Every three months.     ketoconazole (NIZORAL) 2 % cream Apply 1 Application topically daily as needed for irritation.     rosuvastatin (CRESTOR) 10 MG tablet Take 10 mg by mouth daily. (Patient taking differently: Take 10 mg by mouth. Every three days.)     No current facility-administered medications for this visit.    Review of Systems: GENERAL: negative for malaise, night sweats HEENT:  No changes in hearing or vision, no nose bleeds or other nasal problems. NECK: Negative for lumps, goiter, pain and significant neck swelling RESPIRATORY: Negative for cough, wheezing CARDIOVASCULAR: Negative for chest pain, leg swelling, palpitations, orthopnea GI: SEE HPI MUSCULOSKELETAL: Negative for joint pain or swelling, back pain, and muscle pain. SKIN: Negative for lesions, rash PSYCH: Negative for sleep disturbance, mood disorder and recent psychosocial stressors. HEMATOLOGY Negative for prolonged bleeding, bruising easily, and swollen nodes. ENDOCRINE: Negative for cold or heat intolerance, polyuria, polydipsia and goiter. NEURO: negative for tremor, gait imbalance, syncope and seizures. The remainder of the review of systems is noncontributory.   Physical Exam: BP 115/66 (BP Location: Right Arm, Patient Position: Sitting, Cuff Size: Large)   Pulse 76   Temp (!) 97.5 F (36.4 C) (Temporal)   Ht 6' 3 (1.905 m)   Wt (!) 329 lb 4.8 oz (149.4 kg)   BMI 41.16 kg/m  GENERAL: The patient is AO x3, in no acute distress. Obese,. HEENT: Head is normocephalic and atraumatic. EOMI are intact. Mouth is well hydrated and without lesions. NECK: Supple. No masses LUNGS: Clear to auscultation. No presence of rhonchi/wheezing/rales. Adequate chest expansion HEART: RRR, normal s1 and s2. ABDOMEN: Soft, nontender, no guarding, no peritoneal signs, and nondistended. BS +. No masses. EXTREMITIES: Without any cyanosis, clubbing, rash, lesions or edema. NEUROLOGIC: AOx3, no focal motor deficit. SKIN: no jaundice, no rashes  Imaging/Labs: as above  I personally reviewed and interpreted the available labs, imaging and endoscopic files.  Impression and Plan: HARL WIECHMANN is a 68 y.o. 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  The patient has a complicated history of PSC-UC, for which he has had mild symptoms.  He has been managed in the past with Entyvio  but had no response as there was persistent ongoing inflammation in his colon despite optimal dosing of the medication.  Due to this, he was recently switched to Remicade  and has tolerated this medication adequately without any complaints.  It appears that symptomatically he has improved as his stools are more formed.  However, he remains to be seen if he has presented some endoscopic improvement of his disease.  He will be actually due for endoscopic surveillance next month given his history of invisible low-grade dysplasia at 50 cm from anal verge, which will also allow to assess at endoscopic response to Remicade  -he will be scheduled for this procedure.  Ideally, we should also dose optimize his medication, for which we will check his Remicade  levels and antibodies today before his next infusion (we had previously ordered PredictRX testing for induction but this was not performed).  For now, he will need to continue Remicade  at the same doses and interval.  In terms of his liver disease, he was found to have PSC for  which he is following at [facility] liver hepatologist.  His liver enzymes have much more improved recently based on most recent blood workup.  CA 19-9 was normal at time.  Notably, he had a FibroScan that suggested cirrhosis, but MR elastography was negative for cirrhosis.  It is unclear if he indeed has cirrhosis at this stage, but will need to continue following closely with hepatology.  Fortunately, has not presented any lesions concerning for malignancy.  I encouraged him to work on dietary modifications to lose weight.  -Schedule colonoscopy in mid December -Check CBC, CMP, CRP, infliximab  levels/antibodies, INR, TB/hepatitis B screening before next  infusion -Continue Remicade  5 mg/kg every 8 weeks -Follow up with liver hepatology clinic in January 2026 -Try to implement a low carbohydrate diet to lose more weight.  All questions were answered.      Toribio Fortune, MD Gastroenterology and Hepatology [facility]

## 2024-08-12 ENCOUNTER — Encounter: Attending: Gastroenterology

## 2024-08-12 VITALS — BP 129/80 | HR 54 | Temp 97.0°F | Resp 18

## 2024-08-12 DIAGNOSIS — K51019 Ulcerative (chronic) pancolitis with unspecified complications: Secondary | ICD-10-CM | POA: Insufficient documentation

## 2024-08-12 MED ORDER — DIPHENHYDRAMINE HCL 25 MG PO CAPS
25.0000 mg | ORAL_CAPSULE | Freq: Once | ORAL | Status: AC
  Administered 2024-08-12: 25 mg via ORAL

## 2024-08-12 MED ORDER — ACETAMINOPHEN 325 MG PO TABS
650.0000 mg | ORAL_TABLET | Freq: Once | ORAL | Status: AC
  Administered 2024-08-12: 650 mg via ORAL

## 2024-08-12 MED ORDER — METHYLPREDNISOLONE SODIUM SUCC 40 MG IJ SOLR
40.0000 mg | Freq: Once | INTRAMUSCULAR | Status: AC
  Administered 2024-08-12: 40 mg via INTRAVENOUS

## 2024-08-12 MED ORDER — SODIUM CHLORIDE 0.9 % IV SOLN
5.0000 mg/kg | Freq: Once | INTRAVENOUS | Status: AC
  Administered 2024-08-12: 700 mg via INTRAVENOUS
  Filled 2024-08-12: qty 70

## 2024-08-12 NOTE — Progress Notes (Signed)
 Diagnosis:  Ulcerative Colitis  Provider:  Eartha Sieving MD  Procedure: IV Infusion  IV Type: Peripheral, IV Location: L wrist  Avsola  (infliximab -axxq), Dose: 700 mg  Infusion Start Time: 0959  Infusion Stop Time: 1105  Post Infusion IV Care: Peripheral IV Discontinued  Discharge: Condition: Good, Destination: Home . AVS Provided  Performed by:  Jahvon Gosline G Pilkington-Burchett, RN

## 2024-08-29 ENCOUNTER — Encounter (HOSPITAL_COMMUNITY): Payer: Self-pay

## 2024-08-29 ENCOUNTER — Encounter (HOSPITAL_COMMUNITY): Admission: RE | Admit: 2024-08-29 | Discharge: 2024-08-29 | Attending: Gastroenterology

## 2024-08-29 ENCOUNTER — Other Ambulatory Visit: Payer: Self-pay

## 2024-09-03 ENCOUNTER — Ambulatory Visit (HOSPITAL_COMMUNITY): Admitting: Anesthesiology

## 2024-09-03 ENCOUNTER — Telehealth (INDEPENDENT_AMBULATORY_CARE_PROVIDER_SITE_OTHER): Payer: Self-pay

## 2024-09-03 ENCOUNTER — Ambulatory Visit (HOSPITAL_COMMUNITY)
Admission: RE | Admit: 2024-09-03 | Discharge: 2024-09-03 | Disposition: A | Discharge status: Home / Self Care | Attending: Gastroenterology | Admitting: Gastroenterology

## 2024-09-03 ENCOUNTER — Encounter (INDEPENDENT_AMBULATORY_CARE_PROVIDER_SITE_OTHER): Payer: Self-pay | Admitting: *Deleted

## 2024-09-03 ENCOUNTER — Encounter (HOSPITAL_COMMUNITY): Admission: RE | Disposition: A | Payer: Self-pay | Source: Home / Self Care | Attending: Gastroenterology

## 2024-09-03 ENCOUNTER — Encounter (HOSPITAL_COMMUNITY): Payer: Self-pay | Admitting: Gastroenterology

## 2024-09-03 ENCOUNTER — Encounter (HOSPITAL_COMMUNITY): Admitting: Anesthesiology

## 2024-09-03 DIAGNOSIS — I10 Essential (primary) hypertension: Secondary | ICD-10-CM | POA: Diagnosis not present

## 2024-09-03 DIAGNOSIS — Z7722 Contact with and (suspected) exposure to environmental tobacco smoke (acute) (chronic): Secondary | ICD-10-CM | POA: Insufficient documentation

## 2024-09-03 DIAGNOSIS — Z8711 Personal history of peptic ulcer disease: Secondary | ICD-10-CM | POA: Insufficient documentation

## 2024-09-03 DIAGNOSIS — D129 Benign neoplasm of anus and anal canal: Secondary | ICD-10-CM | POA: Diagnosis not present

## 2024-09-03 DIAGNOSIS — Z6838 Body mass index (BMI) 38.0-38.9, adult: Secondary | ICD-10-CM | POA: Insufficient documentation

## 2024-09-03 DIAGNOSIS — K5730 Diverticulosis of large intestine without perforation or abscess without bleeding: Secondary | ICD-10-CM

## 2024-09-03 DIAGNOSIS — I129 Hypertensive chronic kidney disease with stage 1 through stage 4 chronic kidney disease, or unspecified chronic kidney disease: Secondary | ICD-10-CM

## 2024-09-03 DIAGNOSIS — Z8551 Personal history of malignant neoplasm of bladder: Secondary | ICD-10-CM | POA: Insufficient documentation

## 2024-09-03 DIAGNOSIS — K7290 Hepatic failure, unspecified without coma: Secondary | ICD-10-CM | POA: Diagnosis not present

## 2024-09-03 DIAGNOSIS — I499 Cardiac arrhythmia, unspecified: Secondary | ICD-10-CM | POA: Insufficient documentation

## 2024-09-03 DIAGNOSIS — D126 Benign neoplasm of colon, unspecified: Secondary | ICD-10-CM | POA: Insufficient documentation

## 2024-09-03 DIAGNOSIS — N189 Chronic kidney disease, unspecified: Secondary | ICD-10-CM | POA: Diagnosis not present

## 2024-09-03 DIAGNOSIS — K6289 Other specified diseases of anus and rectum: Secondary | ICD-10-CM

## 2024-09-03 DIAGNOSIS — E66813 Obesity, class 3: Secondary | ICD-10-CM | POA: Diagnosis not present

## 2024-09-03 DIAGNOSIS — G4733 Obstructive sleep apnea (adult) (pediatric): Secondary | ICD-10-CM | POA: Diagnosis not present

## 2024-09-03 DIAGNOSIS — K5100 Ulcerative (chronic) pancolitis without complications: Secondary | ICD-10-CM | POA: Insufficient documentation

## 2024-09-03 HISTORY — PX: COLONOSCOPY: SHX5424

## 2024-09-03 LAB — HM COLONOSCOPY

## 2024-09-03 SURGERY — COLONOSCOPY
Anesthesia: Monitor Anesthesia Care

## 2024-09-03 MED ORDER — PHENYLEPHRINE 80 MCG/ML (10ML) SYRINGE FOR IV PUSH (FOR BLOOD PRESSURE SUPPORT)
PREFILLED_SYRINGE | INTRAVENOUS | Status: DC | PRN
  Administered 2024-09-03 (×5): 160 ug via INTRAVENOUS

## 2024-09-03 MED ORDER — EPHEDRINE SULFATE (PRESSORS) 25 MG/5ML IV SOSY
PREFILLED_SYRINGE | INTRAVENOUS | Status: DC | PRN
  Administered 2024-09-03: 10 mg via INTRAVENOUS

## 2024-09-03 MED ORDER — PROPOFOL 500 MG/50ML IV EMUL
INTRAVENOUS | Status: DC | PRN
  Administered 2024-09-03: 200 ug/kg/min via INTRAVENOUS

## 2024-09-03 MED ORDER — LACTATED RINGERS IV SOLN: INTRAVENOUS | Status: DC

## 2024-09-03 MED ORDER — LIDOCAINE 2% (20 MG/ML) 5 ML SYRINGE
INTRAMUSCULAR | Status: DC | PRN
  Administered 2024-09-03: 100 mg via INTRAVENOUS

## 2024-09-03 NOTE — H&P (Signed)
 Alex Thomas is an 68 y.o. male.   Chief Complaint: ulcerative colitis and LGD. HPI: Alex Thomas is a 68 y.o. 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  Past Medical History:  Diagnosis Date   Arthritis    Bladder cancer [facility])    age 77   Dysrhythmia    History of kidney stones    Multiple gastric ulcers    Pneumonia    when young    Past Surgical History:  Procedure Laterality Date   APPENDECTOMY     BIOPSY  11/23/2021   Procedure: BIOPSY;  Surgeon: Eartha Angelia Sieving, MD;  [facility];  Service: Gastroenterology;;   BIOPSY  09/23/2022   Procedure: BIOPSY;  Surgeon: Eartha Angelia Sieving, MD;  [facility];  Service: Gastroenterology;;   CHOLECYSTECTOMY     COLONOSCOPY N/A 02/21/2024   Procedure: COLONOSCOPY;  Surgeon: Eartha Angelia Sieving, MD;  [facility];  Service: Gastroenterology;  Laterality: N/A;  10:30AM;ASA 1   COLONOSCOPY WITH PROPOFOL  N/A 11/23/2021   Procedure: COLONOSCOPY WITH PROPOFOL ;  Surgeon: Eartha Angelia Sieving, MD;  [facility];  Service: Gastroenterology;  Laterality: N/A;  805 ASA 1   COLONOSCOPY WITH PROPOFOL  N/A 09/23/2022   Procedure: COLONOSCOPY WITH PROPOFOL ;  Surgeon: Eartha Angelia Sieving, MD;  [facility];  Service: Gastroenterology;  Laterality: N/A;  1:00pm, asa 2, pt knows to arrive at 10:00   CYSTOSCOPY/URETEROSCOPY/HOLMIUM LASER/STENT PLACEMENT Bilateral 07/25/2023   Procedure: CYSTOSCOPY BILATERAL URETEROSCOPY/RETROGRADE PYELOGRAM/HOLMIUM LASER/STENT PLACEMENT;  Surgeon: Watt Rush, MD;  [facility];  Service: Urology;  Laterality: Bilateral;  2 HRS FOR CASE   GASTRECTOMY     HERNIA REPAIR     KIDNEY STONE SURGERY     PERCUTANEOUS  NEPHROLITHOTRIPSY     POLYPECTOMY  11/23/2021   Procedure: POLYPECTOMY;  Surgeon: Eartha Angelia Sieving, MD;  [facility];  Service: Gastroenterology;;   MATIAS  09/23/2022   Procedure: MATIAS;  Surgeon: Eartha Angelia Sieving, MD;  [facility];  Service: Gastroenterology;;    Family History  Problem Relation Age of Onset   Colon cancer Mother    Social History:  reports that he has never smoked. He has been exposed to tobacco smoke. He has never used smokeless tobacco. He reports that he does not currently use alcohol. He reports that he does not currently use drugs.  Allergies: Allergies[1]  Medications Prior to Admission  Medication Sig Dispense Refill   Cholecalciferol  (VITAMIN D ) 125 MCG (5000 UT) CAPS Take 10,000 Units by mouth daily.     Cyanocobalamin  (VITAMIN B-12) 1000 MCG SUBL Place 2 tablets (2,000 mcg total) under the tongue daily. 180 tablet 1   folic acid  (FOLVITE ) 1 MG tablet TAKE ONE (1) TABLET BY MOUTH EVERY DAY 90 tablet 1   inFLIXimab -axxq (AVSOLA  IV) Inject into the vein. Every three months.     ketoconazole (NIZORAL) 2 % cream Apply 1 Application topically daily as needed for irritation.     polyethylene glycol-electrolytes (NULYTELY) 420 g solution Take 4,000 mLs by mouth as directed. 4000 mL 0   rosuvastatin (CRESTOR) 10 MG tablet Take 10 mg by mouth daily. (Patient taking differently: Take 10 mg by mouth. Every three days.)      Results for orders placed or performed  in visit on 07/29/24 (from the past 48 hours)  C-reactive protein     Status: None (Preliminary result)   Collection Time: 09/02/24 12:09 PM  Result Value Ref Range   CRP WILL FOLLOW   CBC with Differential/Platelet     Status: Abnormal   Collection Time: 09/02/24 12:09 PM  Result Value Ref Range   WBC 8.7 3.4 - 10.8 x10E3/uL   RBC 4.86 4.14 - 5.80 x10E6/uL   Hemoglobin 13.7 13.0 - 17.7 g/dL   Hematocrit 56.3 62.4 - 51.0 %   MCV 90 79 - 97 fL    MCH 28.2 26.6 - 33.0 pg   MCHC 31.4 (L) 31.5 - 35.7 g/dL   RDW 86.5 88.3 - 84.5 %   Platelets 280 150 - 450 x10E3/uL   Neutrophils 61 Not Estab. %   Lymphs 27 Not Estab. %   Monocytes 8 Not Estab. %   Eos 4 Not Estab. %   Basos 0 Not Estab. %   Neutrophils Absolute 5.3 1.4 - 7.0 x10E3/uL   Lymphocytes Absolute 2.3 0.7 - 3.1 x10E3/uL   Monocytes Absolute 0.7 0.1 - 0.9 x10E3/uL   EOS (ABSOLUTE) 0.4 0.0 - 0.4 x10E3/uL   Basophils Absolute 0.0 0.0 - 0.2 x10E3/uL   Immature Granulocytes 0 Not Estab. %   Immature Grans (Abs) 0.0 0.0 - 0.1 x10E3/uL  Comprehensive metabolic panel with GFR     Status: None (Preliminary result)   Collection Time: 09/02/24 12:09 PM  Result Value Ref Range   Glucose WILL FOLLOW    BUN WILL FOLLOW    Creatinine, Ser WILL FOLLOW    eGFR WILL FOLLOW    BUN/Creatinine Ratio WILL FOLLOW    Sodium WILL FOLLOW    Potassium WILL FOLLOW    Chloride WILL FOLLOW    CO2 WILL FOLLOW    Calcium WILL FOLLOW    Total Protein WILL FOLLOW    Albumin WILL FOLLOW    Globulin, Total WILL FOLLOW    Bilirubin Total WILL FOLLOW    Alkaline Phosphatase WILL FOLLOW    AST WILL FOLLOW    ALT WILL FOLLOW   Hepatitis B Surface AntiGEN     Status: None   Collection Time: 09/02/24 12:09 PM  Result Value Ref Range   Hepatitis B Surface Ag Negative Negative  INR/PT     Status: None   Collection Time: 09/02/24 12:09 PM  Result Value Ref Range   INR 1.0 0.9 - 1.2    Comment: Reference interval is for non-anticoagulated patients. Suggested INR therapeutic range for Vitamin K  antagonist therapy:    Standard Dose (moderate intensity                   therapeutic range):       2.0 - 3.0    Higher intensity therapeutic range       2.5 - 3.5    Prothrombin Time 10.8 9.1 - 12.0 sec  QuantiFERON-TB Gold Plus     Status: None (Preliminary result)   Collection Time: 09/02/24 12:09 PM  Result Value Ref Range   QuantiFERON Incubation WILL FOLLOW    QuantiFERON-TB Gold Plus WILL FOLLOW     No results found.  Review of Systems  All other systems reviewed and are negative.   Blood pressure 124/78, temperature 97.7 F (36.5 C), resp. rate 14, height 6' 3 (1.905 m), weight (!) 140.6 kg, SpO2 98%. Physical Exam  GENERAL: The patient is AO x3, in no acute distress. HEENT:  Head is normocephalic and atraumatic. EOMI are intact. Mouth is well hydrated and without lesions. NECK: Supple. No masses LUNGS: Clear to auscultation. No presence of rhonchi/wheezing/rales. Adequate chest expansion HEART: RRR, normal s1 and s2. ABDOMEN: Soft, nontender, no guarding, no peritoneal signs, and nondistended. BS +. No masses. EXTREMITIES: Without any cyanosis, clubbing, rash, lesions or edema. NEUROLOGIC: AOx3, no focal motor deficit. SKIN: no jaundice, no rashes  Assessment/Plan Alex Thomas is a 68 y.o. 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 Will proceed with colonoscopy  Toribio Eartha Flavors, MD 09/03/2024, 7:39 AM       [1] No Known Allergies

## 2024-09-03 NOTE — Telephone Encounter (Signed)
 I spoke with the patient and confirmed his next appointment at [facility] Infusion is scheduled for 10/07/2024 at 9:30 am. He is aware we are needing this lab drawn on him just prior to the infusion while there. I have the form on my desk and will take it to the infusion clinic just prior to his infusion date.

## 2024-09-03 NOTE — Anesthesia Preprocedure Evaluation (Signed)
"                                    Anesthesia Evaluation  Patient identified by MRN, date of birth, ID band Patient awake    Reviewed: Allergy & Precautions, H&P , NPO status , Patient's Chart, lab work & pertinent test results, reviewed documented beta blocker date and time   Airway Mallampati: II  TM Distance: >3 FB Neck ROM: full    Dental no notable dental hx.    Pulmonary sleep apnea , pneumonia   Pulmonary exam normal breath sounds clear to auscultation       Cardiovascular Exercise Tolerance: Good hypertension, + dysrhythmias  Rhythm:regular Rate:Normal     Neuro/Psych  PSYCHIATRIC DISORDERS      negative neurological ROS     GI/Hepatic Neg liver ROS, PUD,,,  Endo/Other    Class 3 obesity  Renal/GU Renal disease  negative genitourinary   Musculoskeletal   Abdominal   Peds  Hematology negative hematology ROS (+)   Anesthesia Other Findings   Reproductive/Obstetrics negative OB ROS                              Anesthesia Physical Anesthesia Plan  ASA: 3  Anesthesia Plan: MAC   Post-op Pain Management:    Induction:   PONV Risk Score and Plan: Propofol  infusion  Airway Management Planned:   Additional Equipment:   Intra-op Plan:   Post-operative Plan:   Informed Consent: I have reviewed the patients History and Physical, chart, labs and discussed the procedure including the risks, benefits and alternatives for the proposed anesthesia with the patient or authorized representative who has indicated his/her understanding and acceptance.     Dental Advisory Given  Plan Discussed with: CRNA  Anesthesia Plan Comments:         Anesthesia Quick Evaluation  "

## 2024-09-03 NOTE — Op Note (Addendum)
 [facility] Patient Name: Alex Thomas Procedure Date: 09/03/2024 8:11 AM MRN: [id number] Date of Birth: 01/17/1956 Attending MD: Toribio Fortune , , [id number] CSN: [id number] Age: 68 Admit Type: Outpatient Procedure:                Colonoscopy Indications:              Chronic ulcerative pancolitis, Assess therapeutic                            response to therapy of chronic ulcerative                            pancolitis- PSC, History of low grade dysplasia Providers:                Toribio Fortune, Madelin Hunter, RN, Bascom Blush Referring MD:              Medicines:                Monitored Anesthesia Care Complications:            No immediate complications. Estimated Blood Loss:     Estimated blood loss: none. Procedure:                Pre-Anesthesia Assessment:                           - Prior to the procedure, a History and Physical                            was performed, and patient medications, allergies                            and sensitivities were reviewed. The patient's                            tolerance of previous anesthesia was reviewed.                           - The risks and benefits of the procedure and the                            sedation options and risks were discussed with the                            patient. All questions were answered and informed                            consent was obtained.                           - ASA Grade Assessment: III - A patient with severe                            systemic disease.                           After obtaining informed consent,  the colonoscope                            was passed under direct vision. Throughout the                            procedure, the patient's blood pressure, pulse, and                            oxygen saturations were monitored continuously. The                            [id number]-[id number] ([id number]) Peds Colon was introduced                            through the anus  and advanced to the the terminal                            ileum. The colonoscopy was performed without                            difficulty. The patient tolerated the procedure                            well. The quality of the bowel preparation was good. Scope In: 8:26:59 AM Scope Out: 8:53:19 AM Scope Withdrawal Time: 0 hours 20 minutes 46 seconds  Total Procedure Duration: 0 hours 26 minutes 20 seconds  Findings:      The perianal and digital rectal examinations were normal.      The terminal ileum appeared normal.      Inflammation was found in a continuous and circumferential pattern from       the transverse colon to the cecum. This was graded as Mayo Score 2       (moderate, with marked erythema, absent vascular pattern, friability,       erosions), and when compared to the previous examination, the findings       are worsened. Imaging was performed using white light and narrow band       imaging to visualize the mucosa. Biopsies from cecum, AC, TC, DC and       recotsigmoid were taken with a cold forceps for histology.      A localized area of nodular mucosa was found from 50 to 60 cm proximal       to the anus. - possibly the area where LGD has been detected x2. Imaging       was performed using white light and narrow band imaging to visualize the       mucosa. Biopsies were taken with a cold forceps for histology.      Two localized areas of granular mucosa were found at 40 cm proximal to       the anus. Biopsies were taken with a cold forceps for histology.      A few small-mouthed diverticula were found in the sigmoid colon.      The retroflexed view of the distal rectum and anal verge was normal and       showed no anal or rectal abnormalities. Impression:               -  The examined portion of the ileum was normal.                           - Moderately active (Mayo Score 2) ulcerative                            colitis, worsened since the last examination.                             Biopsied.                           - Nodular mucosa from 50 to 60 cm proximal to the                            anus. Biopsied.                           - Granularity x2 at 40 cm proximal to the anus.                            Biopsied.                           - Diverticulosis in the sigmoid colon.                           - The distal rectum and anal verge are normal on                            retroflexion view. Moderate Sedation:      Per Anesthesia Care Recommendation:           - Discharge patient to home (ambulatory).                           - Resume previous diet.                           - Await pathology results.                           - Repeat colonoscopy for surveillance based on                            pathology results.                           - Continue infliximab  every 8 weeks for now - check                            PredictRX before next dose.                           - Close follow up with [facility]. Procedure Code(s):        --- Professional ---  54619, Colonoscopy, flexible; with biopsy, single                            or multiple Diagnosis Code(s):        --- Professional ---                           K63.89, Other specified diseases of intestine                           K51.00, Ulcerative (chronic) pancolitis without                            complications                           K57.30, Diverticulosis of large intestine without                            perforation or abscess without bleeding CPT copyright 2022 American Medical Association. All rights reserved. The codes documented in this report are preliminary and upon coder review may  be revised to meet current compliance requirements. Toribio Fortune, MD Toribio Fortune,  09/03/2024 9:12:01 AM This report has been signed electronically. Number of Addenda: 0

## 2024-09-03 NOTE — Transfer of Care (Signed)
 Immediate Anesthesia Transfer of Care Note  Patient: Alex Thomas  Procedure(s) Performed: COLONOSCOPY  Patient Location: Endoscopy Unit  Anesthesia Type:MAC  Level of Consciousness: awake, alert , oriented, and patient cooperative  Airway & Oxygen Therapy: Patient Spontanous Breathing  Post-op Assessment: Report given to RN, Post -op Vital signs reviewed and stable, and Patient moving all extremities X 4  Post vital signs: Reviewed and stable  Last Vitals:  Vitals Value Taken Time  BP    Temp    Pulse    Resp    SpO2      Last Pain:  Vitals:   09/03/24 0821  PainSc: 0-No pain         Complications: No notable events documented.

## 2024-09-03 NOTE — Telephone Encounter (Signed)
 Alex Thomas Peri, Riggin Cuttino L, CMA Pt aware of OV

## 2024-09-03 NOTE — Telephone Encounter (Signed)
 [name], [name], MD  Ezra Lamp, CPhT; Peri Camelia CROME, CMA; Burnis [name] Cc: Alex Deatrice FALCON, MD Hi, Please arrange a PredictRX testing the day of his next infliximab  infusion (maintenance).  Devere, can you please schedule a follow up appointment for this patient in 2-3 months with Dr. Cinderella or any of the APPs?  Thanks,  Alex Fortune, MD Gastroenterology and Hepatology [facility] Gastroenterology

## 2024-09-03 NOTE — Discharge Instructions (Addendum)
 You are being discharged to home.  Resume your previous diet.  We are waiting for your pathology results.  Your physician has recommended a repeat colonoscopy for surveillance based on pathology results.  Continue infliximab  every 8 weeks for now - check PredictRX before next dose. Close follow up with [facility].

## 2024-09-04 LAB — SURGICAL PATHOLOGY

## 2024-09-05 LAB — CBC WITH DIFFERENTIAL/PLATELET
Basophils Absolute: 0 x10E3/uL (ref 0.0–0.2)
Basos: 0 %
EOS (ABSOLUTE): 0.4 x10E3/uL (ref 0.0–0.4)
Eos: 4 %
Hematocrit: 43.6 % (ref 37.5–51.0)
Hemoglobin: 13.7 g/dL (ref 13.0–17.7)
Immature Grans (Abs): 0 x10E3/uL (ref 0.0–0.1)
Immature Granulocytes: 0 %
Lymphocytes Absolute: 2.3 x10E3/uL (ref 0.7–3.1)
Lymphs: 27 %
MCH: 28.2 pg (ref 26.6–33.0)
MCHC: 31.4 g/dL — ABNORMAL LOW (ref 31.5–35.7)
MCV: 90 fL (ref 79–97)
Monocytes Absolute: 0.7 x10E3/uL (ref 0.1–0.9)
Monocytes: 8 %
Neutrophils Absolute: 5.3 x10E3/uL (ref 1.4–7.0)
Neutrophils: 61 %
Platelets: 280 x10E3/uL (ref 150–450)
RBC: 4.86 x10E6/uL (ref 4.14–5.80)
RDW: 13.4 % (ref 11.6–15.4)
WBC: 8.7 x10E3/uL (ref 3.4–10.8)

## 2024-09-05 LAB — QUANTIFERON-TB GOLD PLUS
QuantiFERON Mitogen Value: 9.6 [IU]/mL
QuantiFERON Nil Value: 0.02 [IU]/mL
QuantiFERON TB1 Ag Value: 0.05 [IU]/mL
QuantiFERON TB2 Ag Value: 0.04 [IU]/mL

## 2024-09-05 LAB — COMPREHENSIVE METABOLIC PANEL WITH GFR
ALT: 25 IU/L (ref 0–44)
AST: 25 IU/L (ref 0–40)
Albumin: 3.9 g/dL (ref 3.9–4.9)
Alkaline Phosphatase: 422 IU/L — ABNORMAL HIGH (ref 47–123)
BUN/Creatinine Ratio: 11 (ref 10–24)
BUN: 14 mg/dL (ref 8–27)
Bilirubin Total: 0.5 mg/dL (ref 0.0–1.2)
CO2: 19 mmol/L — ABNORMAL LOW (ref 20–29)
Calcium: 9.1 mg/dL (ref 8.6–10.2)
Chloride: 101 mmol/L (ref 96–106)
Creatinine, Ser: 1.24 mg/dL (ref 0.76–1.27)
Globulin, Total: 3.4 g/dL (ref 1.5–4.5)
Glucose: 90 mg/dL (ref 70–99)
Potassium: 5 mmol/L (ref 3.5–5.2)
Sodium: 140 mmol/L (ref 134–144)
Total Protein: 7.3 g/dL (ref 6.0–8.5)
eGFR: 63 mL/min/1.73

## 2024-09-05 LAB — C-REACTIVE PROTEIN: CRP: 42 mg/L — ABNORMAL HIGH (ref 0–10)

## 2024-09-05 LAB — PROTIME-INR
INR: 1 (ref 0.9–1.2)
Prothrombin Time: 10.8 s (ref 9.1–12.0)

## 2024-09-05 LAB — HEPATITIS B SURFACE ANTIGEN: Hepatitis B Surface Ag: NEGATIVE

## 2024-09-06 ENCOUNTER — Ambulatory Visit (INDEPENDENT_AMBULATORY_CARE_PROVIDER_SITE_OTHER): Payer: Self-pay | Admitting: Gastroenterology

## 2024-09-06 NOTE — Anesthesia Postprocedure Evaluation (Signed)
"   Anesthesia Post Note  Patient: Alex Thomas Agent  Procedure(s) Performed: COLONOSCOPY  Patient location during evaluation: Phase II Anesthesia Type: MAC Level of consciousness: awake Pain management: pain level controlled Vital Signs Assessment: post-procedure vital signs reviewed and stable Respiratory status: spontaneous breathing and respiratory function stable Cardiovascular status: blood pressure returned to baseline and stable Postop Assessment: no headache and no apparent nausea or vomiting Anesthetic complications: no Comments: Late entry   No notable events documented.   Last Vitals:  Vitals:   09/03/24 0709 09/03/24 0859  BP: 124/78 109/63  Pulse:  (!) 104  Resp: 14 17  Temp: 36.5 C 36.6 C  SpO2: 98% 98%    Last Pain:  Vitals:   09/03/24 0859  TempSrc: Oral  PainSc: 0-No pain                 Yvonna PARAS Maryela Tapper      "

## 2024-09-10 ENCOUNTER — Encounter (HOSPITAL_COMMUNITY): Payer: Self-pay | Admitting: Gastroenterology

## 2024-09-10 LAB — SERIAL MONITORING

## 2024-09-11 LAB — INFLIXIMAB+AB (SERIAL MONITOR)
Anti-Infliximab Antibody: 695 ng/mL
Infliximab Drug Level: <0.4 ug/mL

## 2024-09-11 NOTE — Progress Notes (Signed)
 6 mth TCS noted in recall Patient result letter mailed procedure note and pathology result faxed to PCP

## 2024-09-12 ENCOUNTER — Encounter: Payer: Self-pay | Admitting: Gastroenterology

## 2024-09-19 ENCOUNTER — Ambulatory Visit: Attending: Cardiology | Admitting: Cardiology

## 2024-09-19 ENCOUNTER — Encounter: Payer: Self-pay | Admitting: Cardiology

## 2024-09-19 VITALS — BP 124/78 | HR 64 | Ht 75.0 in | Wt 321.0 lb

## 2024-09-19 DIAGNOSIS — I480 Paroxysmal atrial fibrillation: Secondary | ICD-10-CM

## 2024-09-19 DIAGNOSIS — E782 Mixed hyperlipidemia: Secondary | ICD-10-CM

## 2024-09-19 NOTE — Progress Notes (Signed)
 "     Clinical Summary Alex Thomas is a 69 y.o.male seen today as a new patient for the following medical problems.    1.PAF - new diagnosis 04/2023 during procedure -he brings EKG from [facility] dated 05/02/2023 which shows afib rate 66 - at pcp f/u was back in SR - interestingly during 06/2023 GI appt HR was 111 CHADS2Vasc score is 1 for age     - no recent palpitations   2.History of ulcerative colitis     3. 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 Kidney stones - extensive history of stones.   6.HLD - upcoming labs with pcp    Past Medical History:  Diagnosis Date   Arthritis    Bladder cancer [facility])    age 51   Dysrhythmia    History of kidney stones    Multiple gastric ulcers    Pneumonia    when young     Allergies[1]   Current Outpatient Medications  Medication Sig Dispense Refill   Cholecalciferol  (VITAMIN D ) 125 MCG (5000 UT) CAPS Take 10,000 Units by mouth daily.     Cyanocobalamin  (VITAMIN B-12) 1000 MCG SUBL Place 2 tablets (2,000 mcg total) under the tongue daily. 180 tablet 1   folic acid  (FOLVITE ) 1 MG tablet TAKE ONE (1) TABLET BY MOUTH EVERY DAY 90 tablet 1   inFLIXimab -axxq (AVSOLA  IV) Inject into the vein. Every three months.     ketoconazole (NIZORAL) 2 % cream Apply 1 Application topically daily as needed for irritation.     rosuvastatin (CRESTOR) 10 MG tablet Take 10 mg by mouth daily. (Patient taking differently: Take 10 mg by mouth. Every three days.)     No current facility-administered medications for this visit.     Past Surgical History:  Procedure Laterality Date   APPENDECTOMY     BIOPSY  11/23/2021   Procedure: BIOPSY;  Surgeon: Eartha Angelia Sieving, MD;  [facility];  Service: Gastroenterology;;   BIOPSY  09/23/2022   Procedure: BIOPSY;  Surgeon: Eartha Angelia Sieving, MD;  Location: [facility];  Service: Gastroenterology;;   CHOLECYSTECTOMY     COLONOSCOPY N/A 02/21/2024   Procedure: COLONOSCOPY;  Surgeon: Eartha Angelia Sieving, MD;  [facility];  Service: Gastroenterology;  Laterality: N/A;  10:30AM;ASA 1   COLONOSCOPY N/A 09/03/2024   Procedure: COLONOSCOPY;  Surgeon: Eartha Angelia Sieving, MD;  [facility];  Service: Gastroenterology;  Laterality: N/A;  815AM, ASA 3   COLONOSCOPY WITH PROPOFOL  N/A 11/23/2021   Procedure: COLONOSCOPY WITH PROPOFOL ;  Surgeon: Eartha Angelia Sieving, MD;  [facility];  Service: Gastroenterology;  Laterality: N/A;  805 ASA 1   COLONOSCOPY WITH PROPOFOL  N/A 09/23/2022   Procedure: COLONOSCOPY WITH PROPOFOL ;  Surgeon: Eartha Angelia Sieving, MD;  [facility];  Service: Gastroenterology;  Laterality: N/A;  1:00pm, asa 2, pt knows to arrive at 10:00   CYSTOSCOPY/URETEROSCOPY/HOLMIUM LASER/STENT PLACEMENT Bilateral 07/25/2023   Procedure: CYSTOSCOPY BILATERAL URETEROSCOPY/RETROGRADE PYELOGRAM/HOLMIUM LASER/STENT PLACEMENT;  Surgeon: Watt Rush, MD;  Location: [facility];  Service: Urology;  Laterality: Bilateral;  2 HRS FOR CASE   GASTRECTOMY     HERNIA REPAIR     KIDNEY STONE SURGERY     PERCUTANEOUS NEPHROLITHOTRIPSY     POLYPECTOMY  11/23/2021   Procedure: POLYPECTOMY;  Surgeon: Eartha Angelia Sieving, MD;  [facility];  Service: Gastroenterology;;   MATIAS  09/23/2022   Procedure: MATIAS;  Surgeon: Eartha Flavors, Toribio, MD;  [facility];  Service: Gastroenterology;;     Allergies[2]    Family History  Problem Relation Age of Onset   Colon cancer Mother      Social History Alex Thomas reports that he has never smoked. He has been exposed to tobacco smoke. He has never used smokeless tobacco. Alex Thomas reports that he does not currently use alcohol.    Physical Examination Today's Vitals   09/19/24 1147  BP: 124/78  Pulse:  64  SpO2: 95%  Weight: (!) 321 lb (145.6 kg)  Height: 6' 3 (1.905 m)   Body mass index is 40.12 kg/m.  Gen: resting comfortably, no acute distress HEENT: no scleral icterus, pupils equal round and reactive, no palptable cervical adenopathy,  CV: RRR, no mrg, no jvd Resp: Clear to auscultation bilaterally GI: abdomen is soft, non-tender, non-distended, normal bowel sounds, no hepatosplenomegaly MSK: extremities are warm, no edema.  Skin: warm, no rash Neuro:  no focal deficits Psych: appropriate affect     Assessment and Plan  1.PAF - asymptotomatic, overall infrequent based on prior monitor - CHADS2Vasc score is just 1 based on current information, would not start anticoag. In the futue if ever considered would need to clear with GI given his PUD, ulcerative colitis, and liver history - continue to monitor - EKG today shows NSR  2. HLD - f/u upcoming pcp labs  F/u 1 year      Dorn PHEBE Ross, M.D.     [1] No Known Allergies [2] No Known Allergies  "

## 2024-09-19 NOTE — Patient Instructions (Signed)
 Medication Instructions:  Your physician recommends that you continue on your current medications as directed. Please refer to the Current Medication list given to you today.  *If you need a refill on your cardiac medications before your next appointment, please call your pharmacy*  Lab Work: None If you have labs (blood work) drawn today and your tests are completely normal, you will receive your results only by: MyChart Message (if you have MyChart) OR A paper copy in the mail If you have any lab test that is abnormal or we need to change your treatment, we will call you to review the results.  Testing/Procedures: None  Follow-Up: At [facility], you and your health needs are our priority.  As part of our continuing mission to provide you with exceptional heart care, our providers are all part of one team.  This team includes your primary Cardiologist (physician) and Advanced Practice Providers or APPs (Physician Assistants and Nurse Practitioners) who all work together to provide you with the care you need, when you need it.  Your next appointment:   1 year(s)  Provider:   You may see Alvan Carrier, MD or one of the following Advanced Practice Providers on your designated Care Team:   Laymon Qua, PA-C  [place], [place] Olivia [place]avy, [place]     We recommend signing up for the patient portal called MyChart.  Sign up information is provided on this After Visit Summary.  MyChart is used to connect with patients for Virtual Visits (Telemedicine).  Patients are able to view lab/test results, encounter notes, upcoming appointments, etc.  Non-urgent messages can be sent to your provider as well.   To learn more about what you can do with MyChart, go to [email or web address].   Other Instructions Thank you for choosing [facility]!

## 2024-09-24 NOTE — Telephone Encounter (Signed)
 I gave the original orders to Alex Thomas at [facility] on 09/24/2024 at 1:42 pm. Made a copy and placed in medical records to be scanned to the patient's chart.

## 2024-10-04 ENCOUNTER — Telehealth: Payer: Self-pay

## 2024-10-04 NOTE — Telephone Encounter (Signed)
 Auth Submission: NO AUTH NEEDED Site of care: Site of care: [facility] Payer: [employer] medicare Medication & CPT/J Code(s) submitted: Avsola  (infliximab -axxq) [id number] Diagnosis Code:  Route of submission (phone, fax, portal): portal Phone # Fax # Auth type: Buy/Bill PB Units/visits requested: 5mg /kg q8weeks Reference number: [id number] Approval from: 10/04/24 to 09/11/25

## 2024-10-07 ENCOUNTER — Ambulatory Visit

## 2024-10-10 ENCOUNTER — Encounter: Attending: Gastroenterology | Admitting: Emergency Medicine

## 2024-10-10 VITALS — BP 117/68 | HR 63 | Temp 97.6°F | Resp 15 | Wt 321.0 lb

## 2024-10-10 DIAGNOSIS — K51019 Ulcerative (chronic) pancolitis with unspecified complications: Secondary | ICD-10-CM | POA: Insufficient documentation

## 2024-10-10 MED ORDER — ACETAMINOPHEN 325 MG PO TABS
650.0000 mg | ORAL_TABLET | Freq: Once | ORAL | Status: AC
  Administered 2024-10-10: 650 mg via ORAL

## 2024-10-10 MED ORDER — DIPHENHYDRAMINE HCL 25 MG PO CAPS
25.0000 mg | ORAL_CAPSULE | Freq: Once | ORAL | Status: AC
  Administered 2024-10-10: 25 mg via ORAL

## 2024-10-10 MED ORDER — METHYLPREDNISOLONE SODIUM SUCC 40 MG IJ SOLR
40.0000 mg | Freq: Once | INTRAMUSCULAR | Status: AC
  Administered 2024-10-10: 40 mg via INTRAVENOUS

## 2024-10-10 MED ORDER — SODIUM CHLORIDE 0.9 % IV SOLN
5.0000 mg/kg | Freq: Once | INTRAVENOUS | Status: AC
  Administered 2024-10-10: 700 mg via INTRAVENOUS
  Filled 2024-10-10: qty 70

## 2024-10-10 NOTE — Progress Notes (Signed)
 Diagnosis: Ulcerative Colitis  Provider:  Shaaron Charleston MD  Procedure: IV Infusion  IV Type: Peripheral, IV Location: L Hand  Avsola  (infliximab -axxq), Dose: 700 mg  Infusion Start Time: 1005  Infusion Stop Time: 1134  Post Infusion IV Care: Peripheral IV Discontinued  Discharge: Condition: Good, Destination: Home . AVS Declined  Performed by:  Delon ONEIDA Officer, RN

## 2024-11-04 ENCOUNTER — Ambulatory Visit (INDEPENDENT_AMBULATORY_CARE_PROVIDER_SITE_OTHER): Admitting: Gastroenterology

## 2024-12-05 ENCOUNTER — Ambulatory Visit
